# Patient Record
Sex: Male | Born: 1950
Health system: Southern US, Community
[De-identification: ages and names within clinical notes are randomized; demographics above are authoritative.]

## PROBLEM LIST (undated history)

## (undated) DIAGNOSIS — K635 Polyp of colon: Secondary | ICD-10-CM

## (undated) DIAGNOSIS — M1990 Unspecified osteoarthritis, unspecified site: Secondary | ICD-10-CM

## (undated) DIAGNOSIS — C801 Malignant (primary) neoplasm, unspecified: Secondary | ICD-10-CM

## (undated) DIAGNOSIS — I10 Essential (primary) hypertension: Secondary | ICD-10-CM

## (undated) DIAGNOSIS — M2550 Pain in unspecified joint: Secondary | ICD-10-CM

## (undated) DIAGNOSIS — Z87442 Personal history of urinary calculi: Secondary | ICD-10-CM

## (undated) HISTORY — DX: Essential (primary) hypertension: I10

---

## 2008-10-07 ENCOUNTER — Ambulatory Visit: Payer: Self-pay | Admitting: Gastroenterology

## 2008-10-07 ENCOUNTER — Ambulatory Visit (HOSPITAL_COMMUNITY): Admission: RE | Admit: 2008-10-07 | Discharge: 2008-10-07 | Payer: Self-pay | Admitting: Gastroenterology

## 2008-10-07 HISTORY — PX: COLONOSCOPY: SHX174

## 2008-10-17 HISTORY — PX: RECTAL SURGERY: SHX760

## 2009-10-14 ENCOUNTER — Emergency Department (HOSPITAL_COMMUNITY): Admission: EM | Admit: 2009-10-14 | Discharge: 2009-10-14 | Payer: Self-pay | Admitting: Emergency Medicine

## 2009-10-15 ENCOUNTER — Inpatient Hospital Stay (HOSPITAL_COMMUNITY): Admission: AD | Admit: 2009-10-15 | Discharge: 2009-10-21 | Payer: Self-pay | Admitting: General Surgery

## 2009-10-15 IMAGING — CT CT ABDOMEN W/ CM
2 of 5 series · 15 of 46 positions shown, 17 images · IV contrast (Omnipaque 300)
Comparison: None

CT ABDOMEN

CLINICAL DATA: Rectal mass.

CT ABDOMEN AND PELVIS WITH CONTRAST
TECHNIQUE: Multidetector CT imaging of the abdomen and pelvis was
performed using the standard protocol following bolus
administration of intravenous contrast.
Contrast: 100 ml intravenous [3W]

[Series 2: abd_pel_with 5.0 b40f · axial · 0.74mm/px · z∈[-478,-28]mm · 12 of 102 slices shown, 14 images]
[im 6/102  soft-tissue]
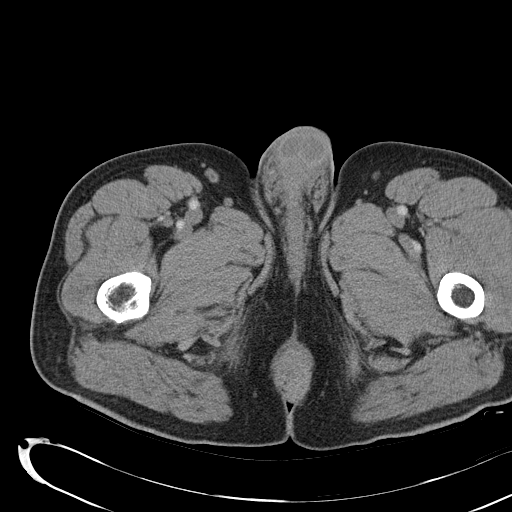
[im 6/102  bone]
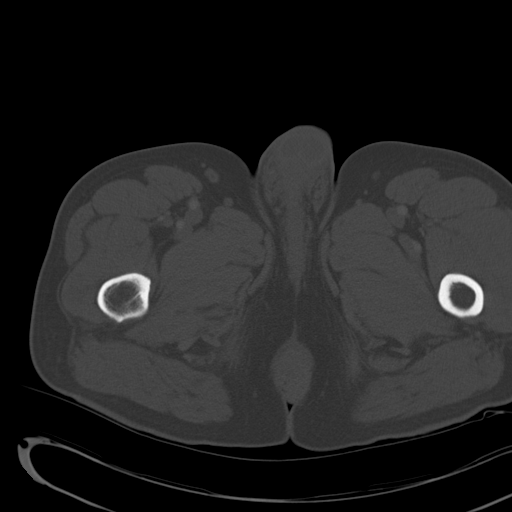
[im 17/102  soft-tissue]
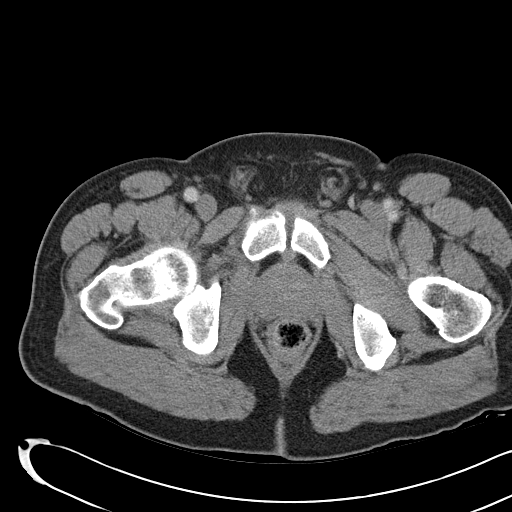
[im 23/102  soft-tissue]
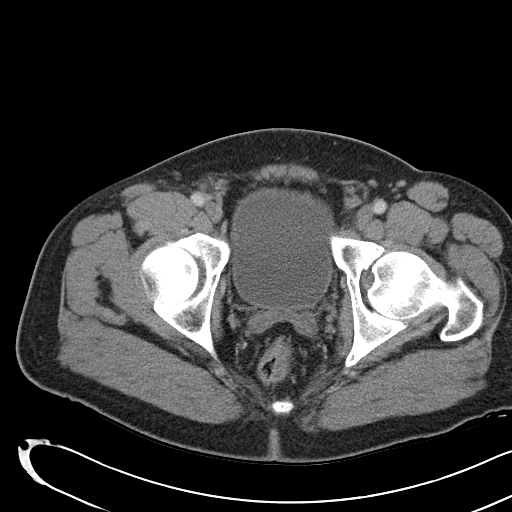
[im 29/102  soft-tissue]
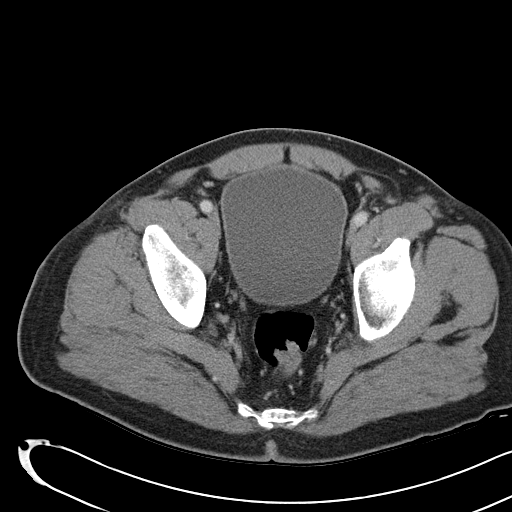
[im 40/102  soft-tissue]
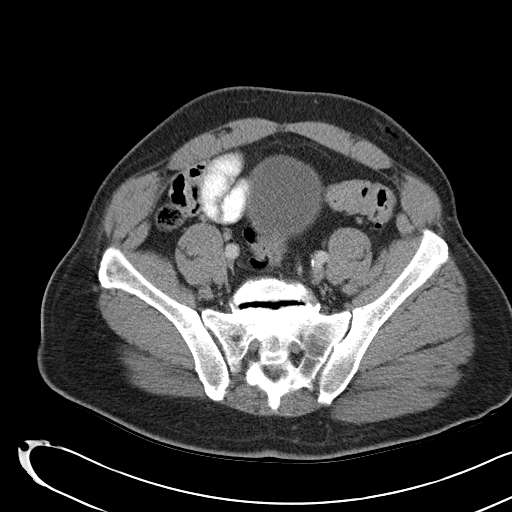
[im 45/102  soft-tissue]
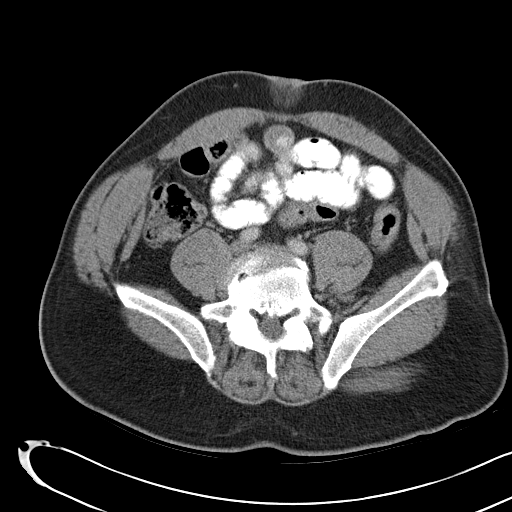
[im 57/102  soft-tissue]
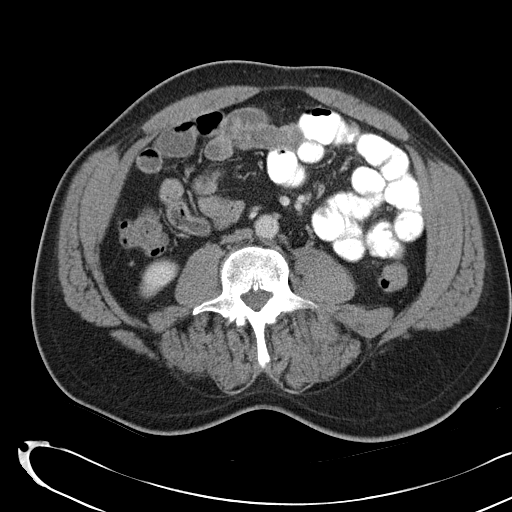
[im 62/102  soft-tissue]
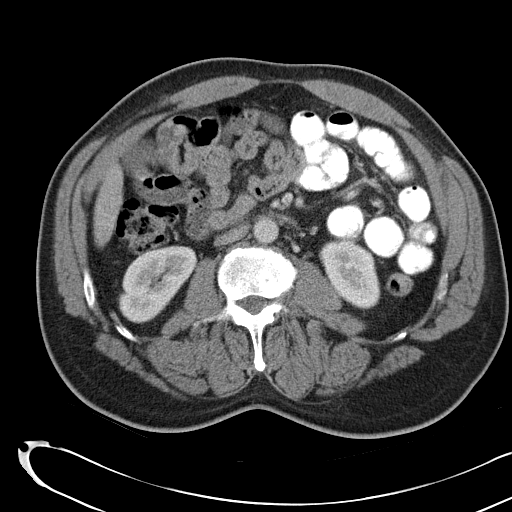
[im 73/102  soft-tissue]
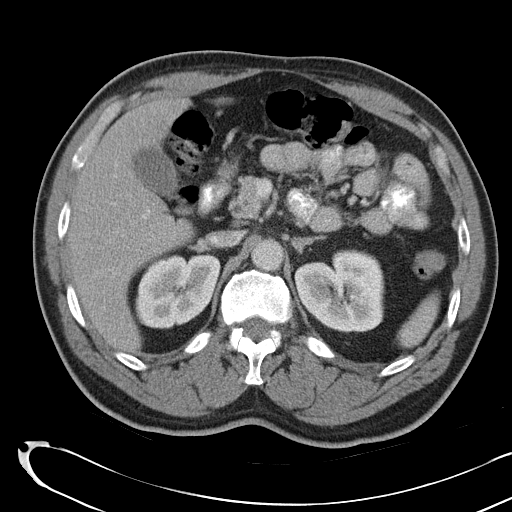
[im 73/102  bone]
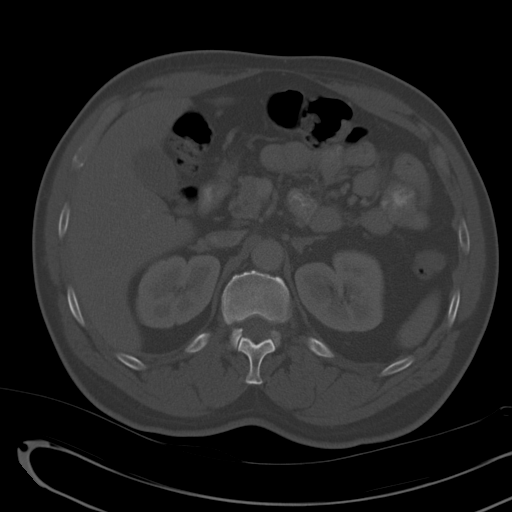
[im 79/102  soft-tissue]
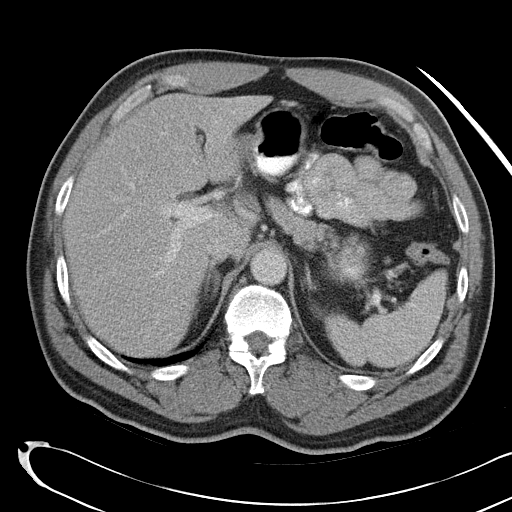
[im 85/102  soft-tissue]
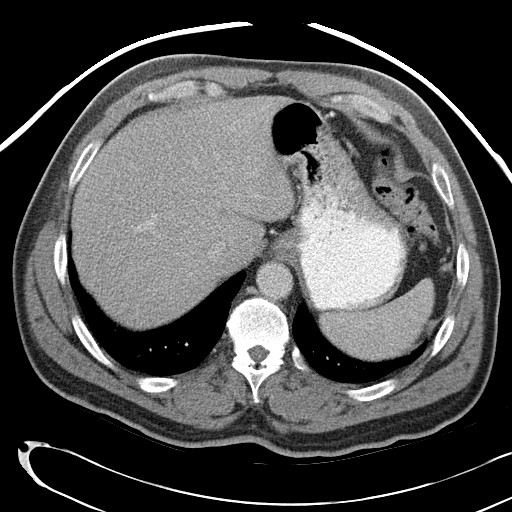
[im 96/102  soft-tissue]
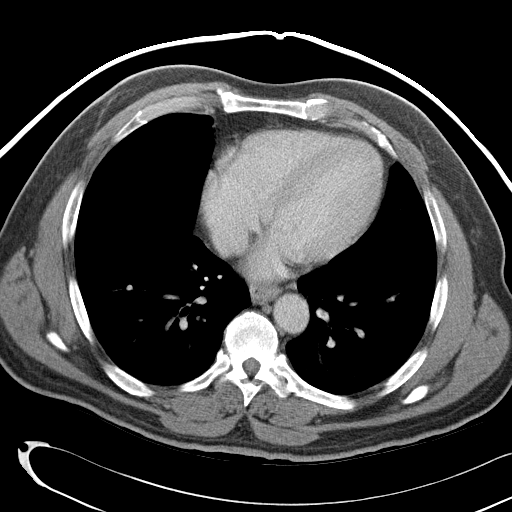

[Series 4: mpr cor post contrast (id) · coronal · 0.72mm/px · 3 of 96 slices shown]
[im 32/96  soft-tissue]
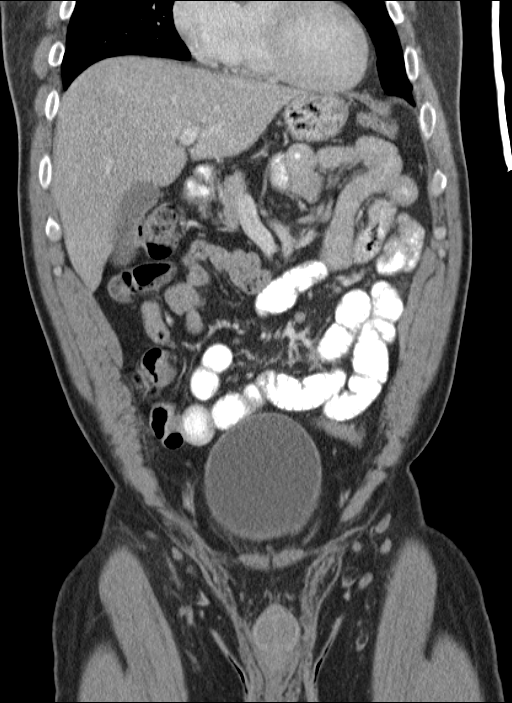
[im 43/96  soft-tissue]
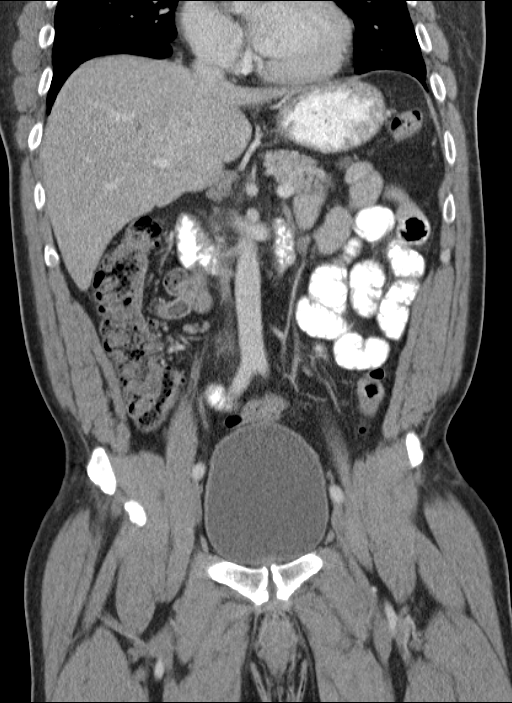
[im 53/96  soft-tissue]
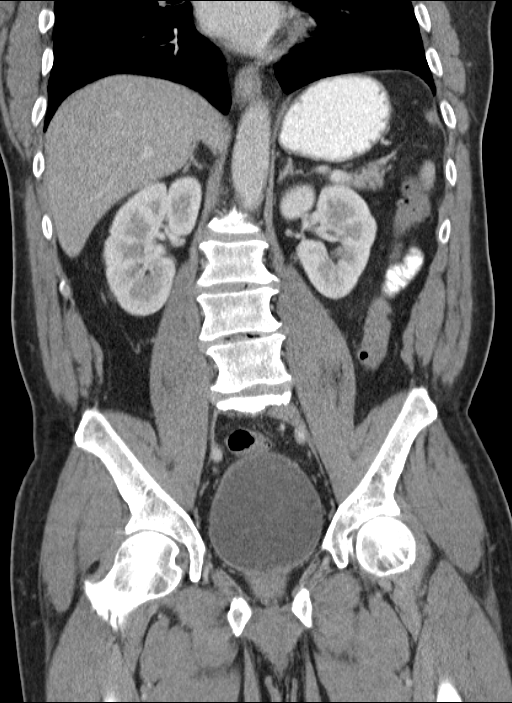

[15 of 46 positions shown; findings below may reference images not displayed]

FINDINGS: A 3 mm nodule in the 4 mm nodule (images six and one
respectively) are identified within the right middle lobe.

Several small hepatic cysts are identified, but the liver is
otherwise unremarkable.
The spleen, pancreas, gallbladder and kidneys, and of the left
adrenal gland are unremarkable.
An 8 mm right adrenal nodule (image 30) has a washout of 45% and
characteristic of an adenoma.

A 6 x 11 mm left periaortic lymph node (image 32) is nonspecific.
No other enlarged lymph nodes are identified.
The visualized bowel is unremarkable.
There is no evidence of free fluid, biliary dilatation, or
abdominal aortic aneurysm.
A moderate wide mouth umbilical hernia containing fat is noted.

No acute or suspicious bony abnormalities are identified.
IMPRESSION: Two small right middle lobe nodules - indeterminate.  Metastatic
disease difficult to exclude given this patient's history of rectal
cancer.  Consider further evaluation with full chest CT.

Upper limits of normal left periaortic lymph node - most likely
reactive but attention to this area on future scans recommended.
PET-CT may be helpful for further evaluation.

Moderate umbilical hernia containing only fat.

CT PELVIS
FINDINGS: Diffuse rectal wall thickening is identified likely
represents this patient's known rectal carcinoma.  Minimal
perirectal stranding is noted.
No enlarged perirectal lymph nodes are identified, although shotty
pelvic sidewall lymph nodes bilaterally are noted.
No evidence of free fluid or urinary calculi identified.
The remainder of the visualized bowel is unremarkable.

The bladder is within normal limits.
No acute or suspicious bony abnormalities are identified.
IMPRESSION: Rectal wall thickening as described likely representing this
patient's known rectal carcinoma. Minimal perirectal stranding may
represent adjacent spread. No enlarged perirectal lymph nodes.

## 2009-10-15 IMAGING — CR DG CHEST 1V PORT
1 series · 1 of 1 positions shown · non-contrast
Comparison: None

CLINICAL DATA: Rectal mass.  Preop respiratory exam.

PORTABLE CHEST - 1 VIEW

[view not recorded]
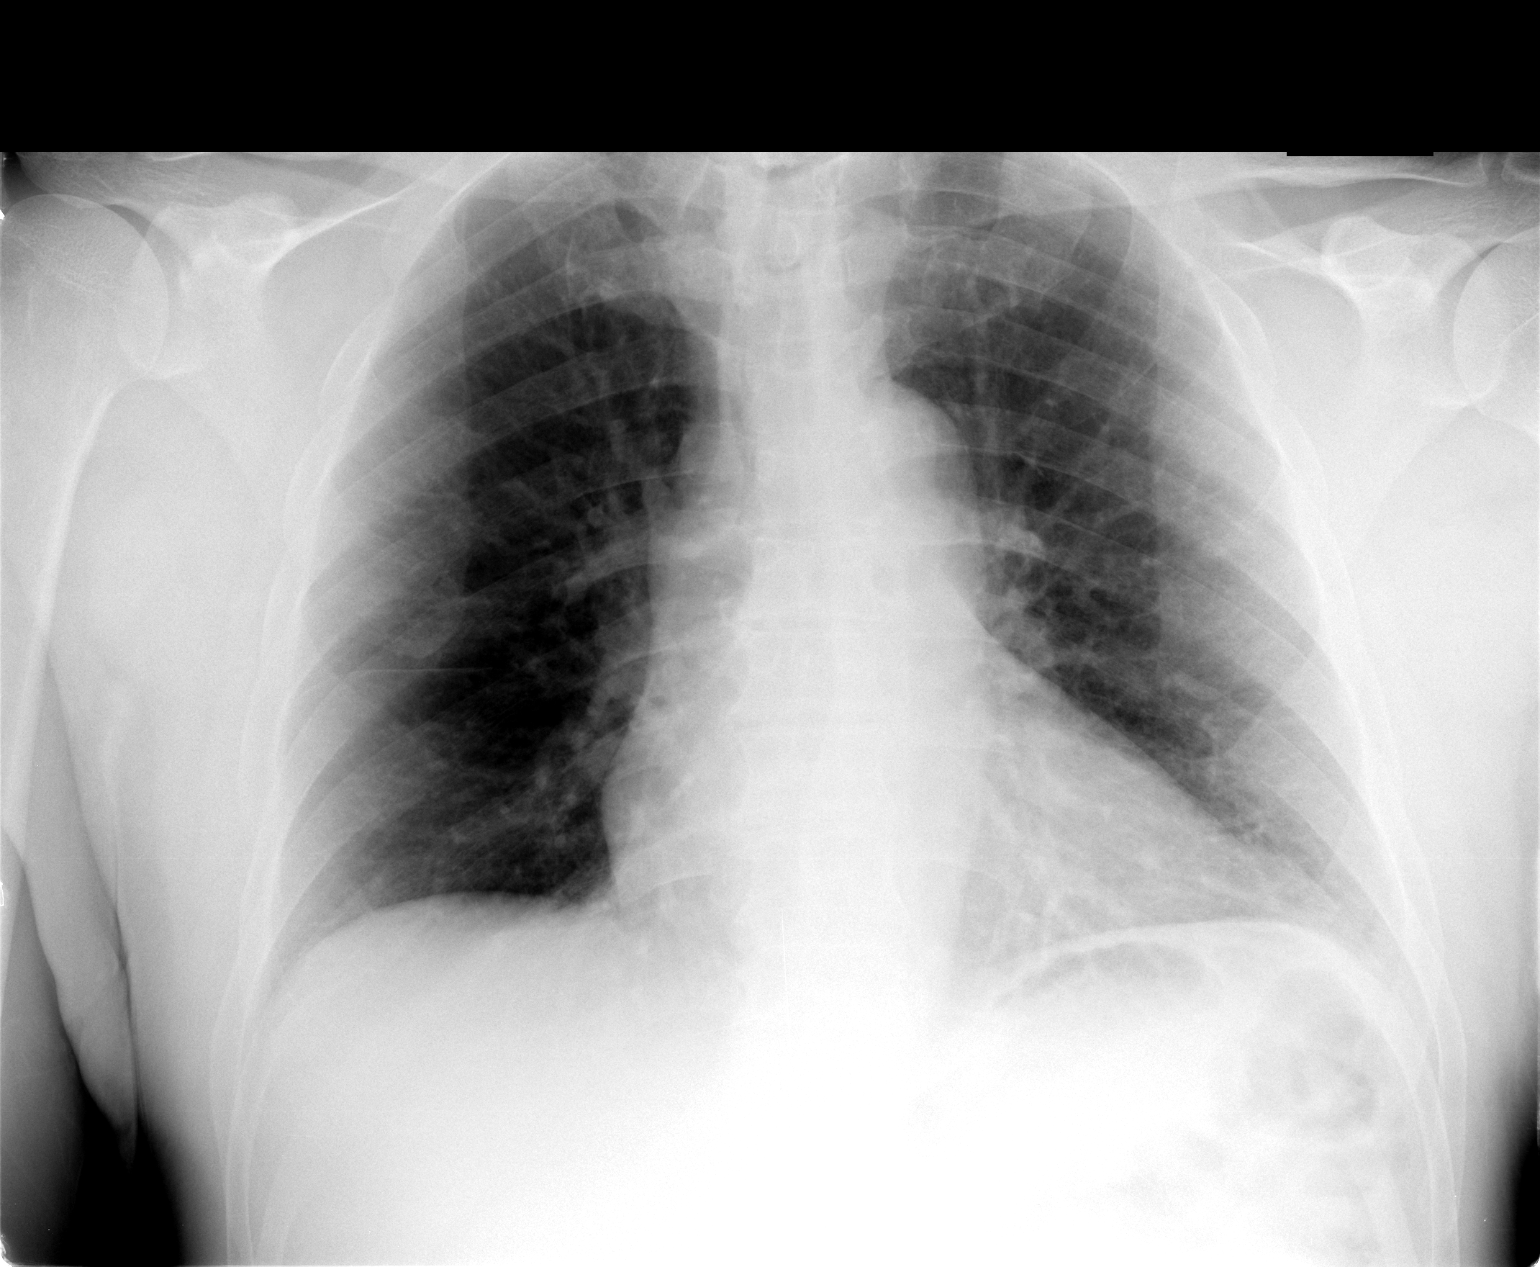

[1 of 1 positions shown; findings below may reference images not displayed]

FINDINGS: The cardiac silhouette, mediastinal and hilar contours
are within normal limits.  The lungs are clear.  The bony thorax is
intact
IMPRESSION: No acute cardiopulmonary findings.

## 2009-10-16 ENCOUNTER — Encounter (INDEPENDENT_AMBULATORY_CARE_PROVIDER_SITE_OTHER): Payer: Self-pay | Admitting: General Surgery

## 2009-10-20 ENCOUNTER — Encounter (INDEPENDENT_AMBULATORY_CARE_PROVIDER_SITE_OTHER): Payer: Self-pay | Admitting: General Surgery

## 2011-01-02 LAB — DIFFERENTIAL
Basophils Absolute: 0 10*3/uL (ref 0.0–0.1)
Eosinophils Relative: 5 % (ref 0–5)
Lymphocytes Relative: 27 % (ref 12–46)
Lymphs Abs: 1.8 10*3/uL (ref 0.7–4.0)
Neutro Abs: 4.1 10*3/uL (ref 1.7–7.7)
Neutrophils Relative %: 60 % (ref 43–77)

## 2011-01-02 LAB — CBC
Platelets: 254 10*3/uL (ref 150–400)
RDW: 12.6 % (ref 11.5–15.5)
WBC: 6.9 10*3/uL (ref 4.0–10.5)

## 2011-01-02 LAB — BASIC METABOLIC PANEL
BUN: 13 mg/dL (ref 6–23)
Calcium: 9.6 mg/dL (ref 8.4–10.5)
Creatinine, Ser: 0.85 mg/dL (ref 0.4–1.5)
GFR calc non Af Amer: >60 mL/min (ref >60)

## 2011-01-17 LAB — DIFFERENTIAL
Basophils Absolute: 0 10*3/uL (ref 0.0–0.1)
Basophils Relative: 0 % (ref 0–1)
Eosinophils Absolute: 0.2 10*3/uL (ref 0.0–0.7)
Eosinophils Absolute: 0.3 10*3/uL (ref 0.0–0.7)
Eosinophils Relative: 2 % (ref 0–5)
Lymphs Abs: 2.6 10*3/uL (ref 0.7–4.0)
Monocytes Absolute: 0.4 10*3/uL (ref 0.1–1.0)
Monocytes Absolute: 0.6 10*3/uL (ref 0.1–1.0)
Monocytes Relative: 7 % (ref 3–12)
Neutro Abs: 4.4 10*3/uL (ref 1.7–7.7)
Neutrophils Relative %: 56 % (ref 43–77)

## 2011-01-17 LAB — BASIC METABOLIC PANEL
BUN: 6 mg/dL (ref 6–23)
CO2: 27 mEq/L (ref 19–32)
Calcium: 9.3 mg/dL (ref 8.4–10.5)
Chloride: 103 mEq/L (ref 96–112)
Glucose, Bld: 97 mg/dL (ref 70–99)
Potassium: 3.4 mEq/L — ABNORMAL LOW (ref 3.5–5.1)
Potassium: 3.9 mEq/L (ref 3.5–5.1)
Sodium: 138 mEq/L (ref 135–145)

## 2011-01-17 LAB — CBC
HCT: 41.7 % (ref 39.0–52.0)
Hemoglobin: 14.1 g/dL (ref 13.0–17.0)
Hemoglobin: 14.9 g/dL (ref 13.0–17.0)
MCHC: 34 g/dL (ref 30.0–36.0)
MCV: 87.8 fL (ref 78.0–100.0)
RBC: 5.09 MIL/uL (ref 4.22–5.81)
RDW: 12.6 % (ref 11.5–15.5)
WBC: 7.9 10*3/uL (ref 4.0–10.5)

## 2011-01-25 ENCOUNTER — Ambulatory Visit (HOSPITAL_COMMUNITY)
Admission: RE | Admit: 2011-01-25 | Discharge: 2011-01-25 | Disposition: A | Discharge status: Home / Self Care | Payer: Self-pay | Source: Ambulatory Visit | Attending: Occupational Medicine | Admitting: Occupational Medicine

## 2011-01-25 ENCOUNTER — Other Ambulatory Visit: Payer: Self-pay | Admitting: Occupational Medicine

## 2011-01-25 DIAGNOSIS — Z0289 Encounter for other administrative examinations: Secondary | ICD-10-CM | POA: Insufficient documentation

## 2011-01-25 DIAGNOSIS — Z021 Encounter for pre-employment examination: Secondary | ICD-10-CM

## 2011-01-25 DIAGNOSIS — F17200 Nicotine dependence, unspecified, uncomplicated: Secondary | ICD-10-CM | POA: Insufficient documentation

## 2011-01-25 DIAGNOSIS — I10 Essential (primary) hypertension: Secondary | ICD-10-CM | POA: Insufficient documentation

## 2011-01-25 IMAGING — CR DG CHEST 2V
2 series · 2 of 2 positions shown · non-contrast
Comparison: [DATE]

CLINICAL DATA: Pre employment physical exam, smoker, hypertension

CHEST - 2 VIEW

[view not recorded (1 of 2)]
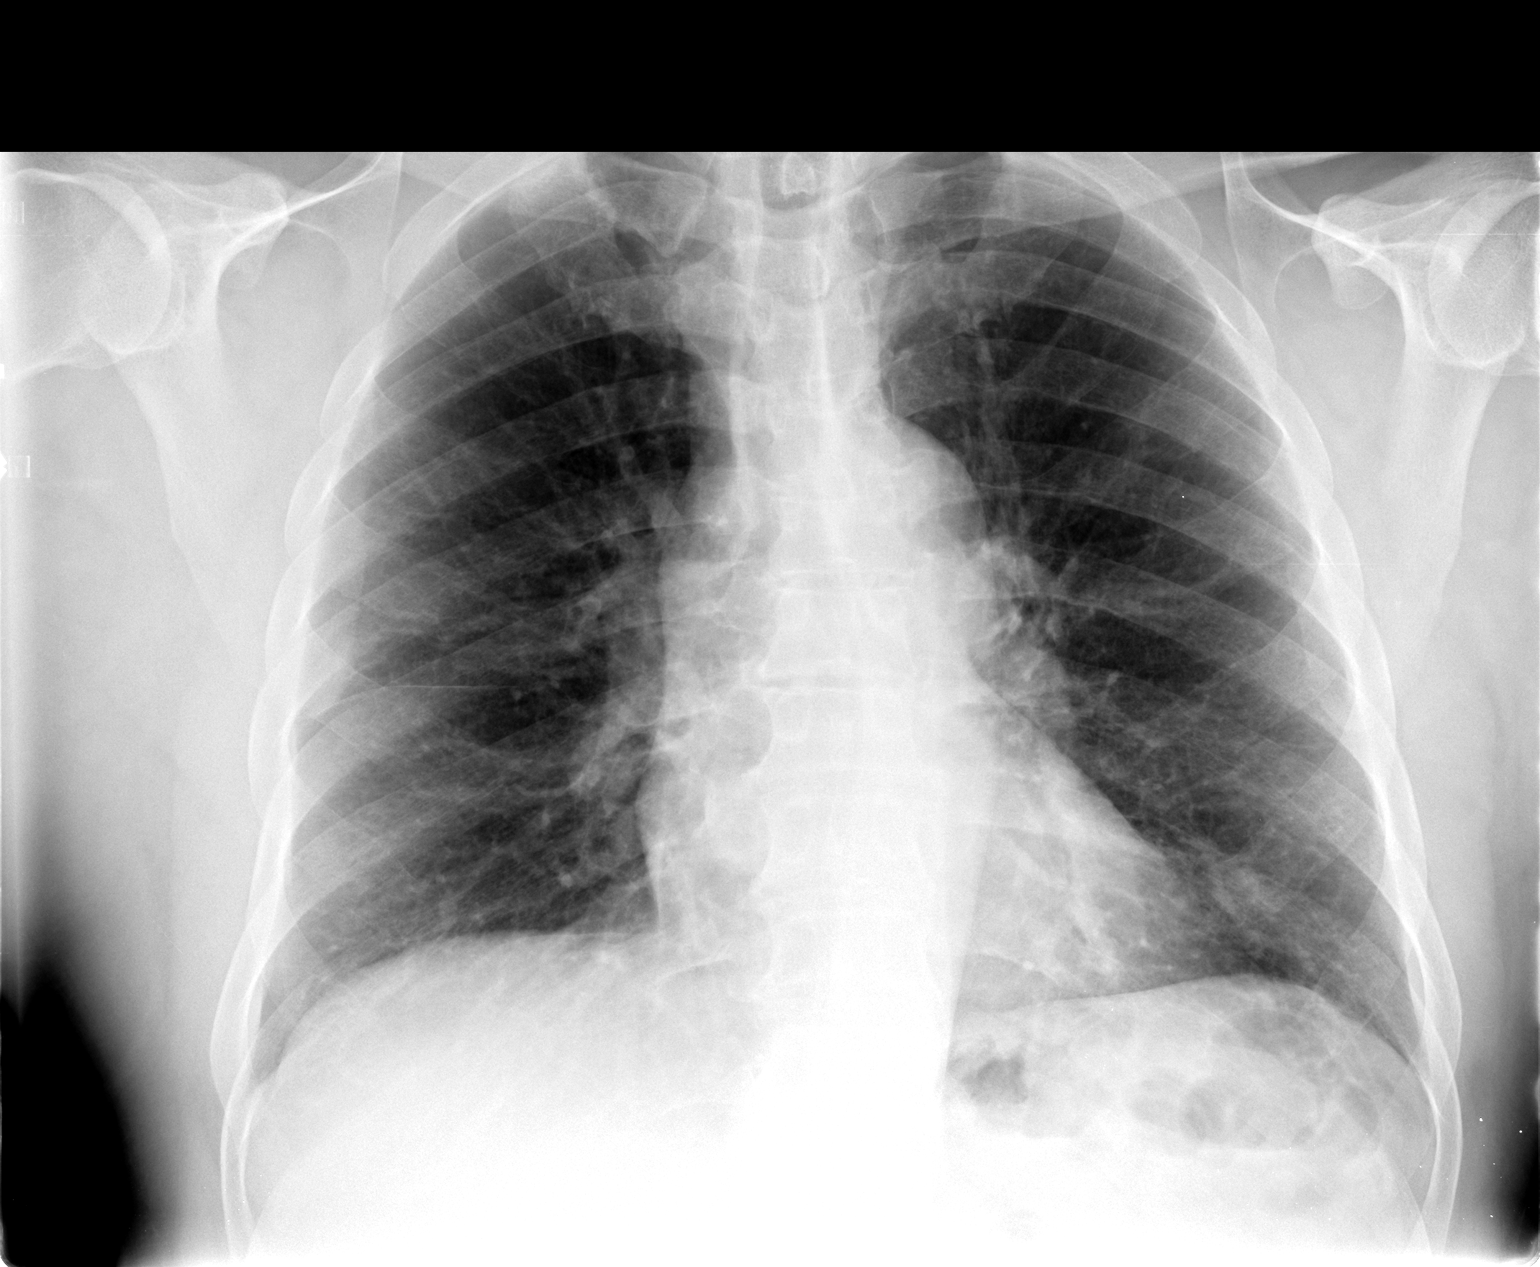

[view not recorded (2 of 2)]
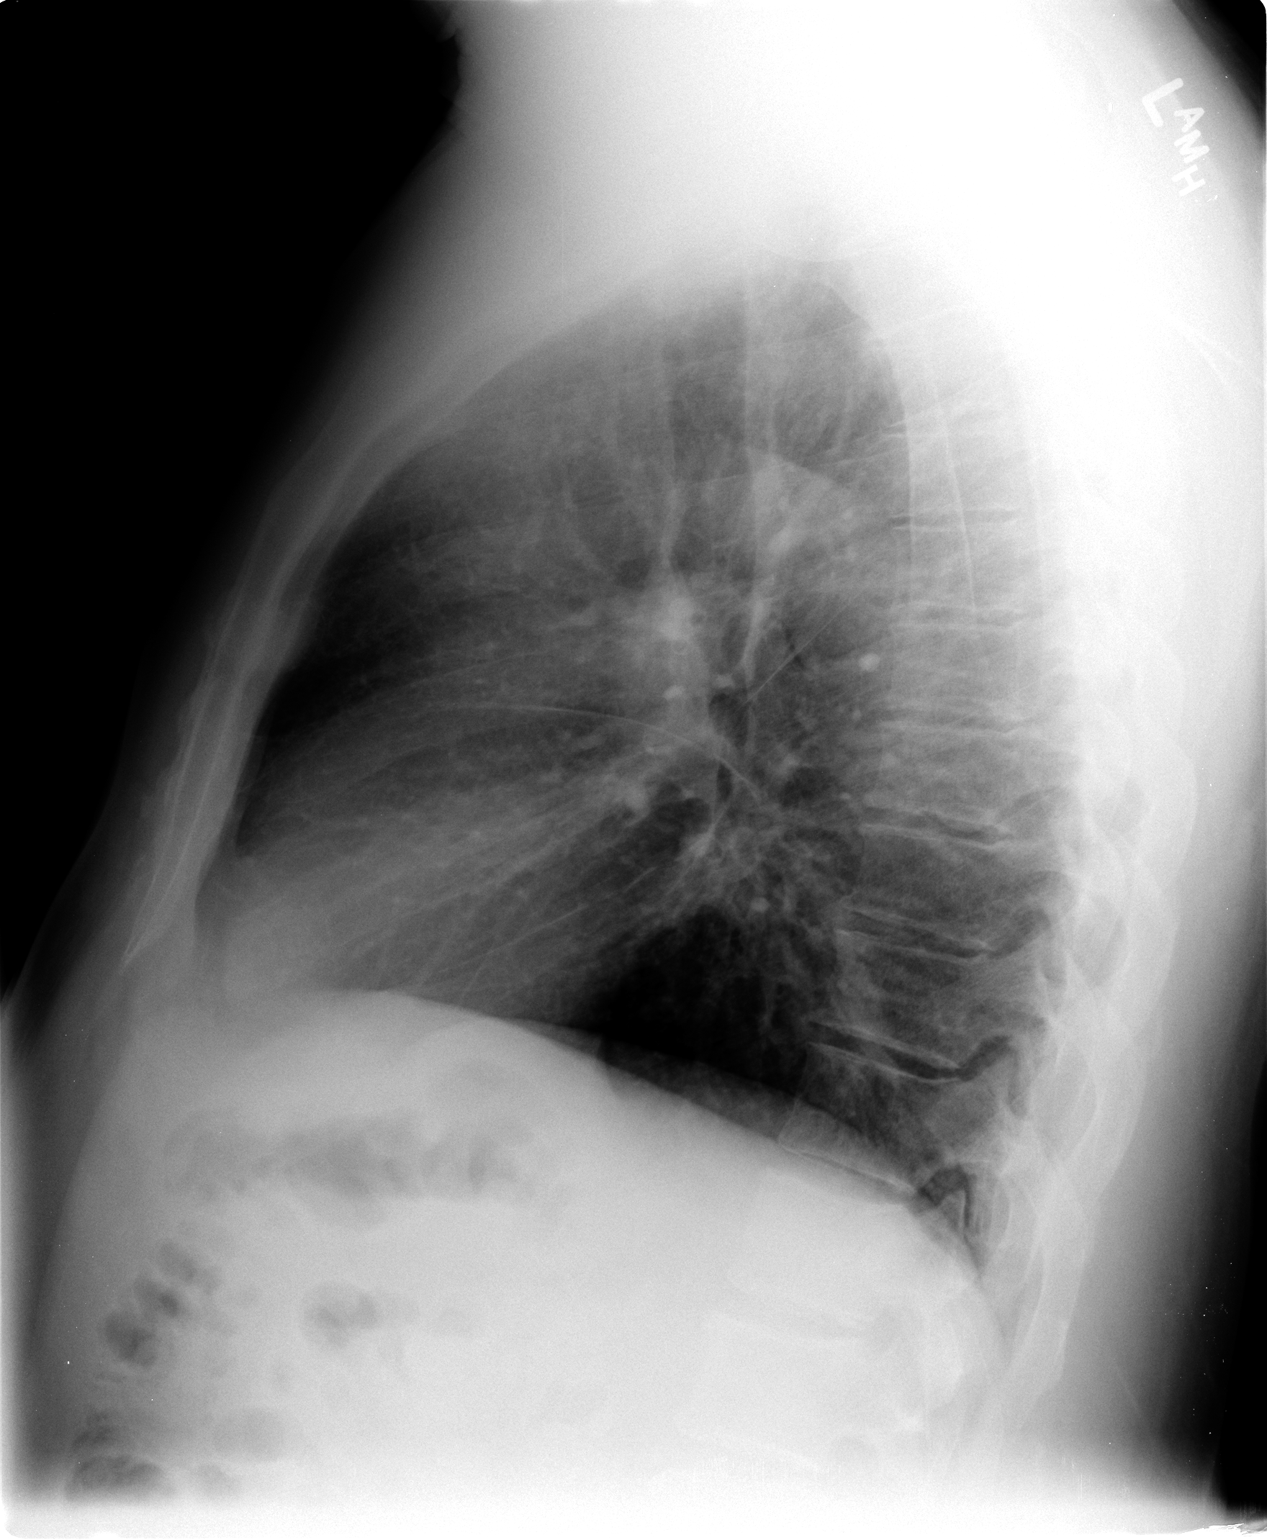

[2 of 2 positions shown; findings below may reference images not displayed]

FINDINGS: The cardiac silhouette, mediastinum, pulmonary
vasculature are within normal limits.  Both lungs are clear.  Mild
chronic interstitial fibrotic disease is noted. There is no acute
bony abnormality.
IMPRESSION: There is no evidence of acute cardiac or pulmonary process.

## 2011-03-01 NOTE — Op Note (Signed)
NAME:  KINTE, TRIM NO.:  [id number]   MEDICAL RECORD NO.:  [id number]          PATIENT TYPE:  AMB   LOCATION:  DAY                           FACILITY:  [facility]   PHYSICIAN:  Kassie Mends, M.D.      DATE OF BIRTH:  08/27/1951   DATE OF PROCEDURE:  10/07/2008  DATE OF DISCHARGE:                               OPERATIVE REPORT   REFERRING Heli Dino:  Tesfaye D. Fanta, MD   PROCEDURE:  Colonoscopy with snare cautery and cold forceps polypectomy.   INDICATION FOR EXAM:  Dustin [name] is a 60 year old male, who presents for  average risk colon cancer screening.   FINDINGS:  1. Multiple large fold seen in the right colon.  Five polyps were      removed in the cecum in the ascending colon using cold forceps and      snare cautery polypectomy.  The polyps varied in size from 3 mm to      8 mm.  2. Slightly pedunculated 6-mm polyps seen in a transverse colon and      removed via snare cautery.  A 3-mm sessile sigmoid colon polyp      removed via cold forceps.  A 6-mm sessile rectal polyp removed via      snare cautery.  3. Rare diverticula seen throughout the ascending and sigmoid colon.      Otherwise, no masses, inflammatory changes, or AVMs seen.  4. Small internal hemorrhoids.  Otherwise, normal retroflex view of      the rectum.   DIAGNOSIS:  Multiple colorectal polyps.   RECOMMENDATIONS:  1. He should follow a high-fiber diet.  He was given a handout on high-      fiber diet, polyps, diverticulosis, and hemorrhoids.  2. Screening colonoscopy in 5 years due to the number of polyps seen.      If his polyps are adenomatous, then we would recommend his first-      degree relatives have a screening colonoscopy at age 33 and then      every 5 years.  3. Will call Mr. Patty with results of his biopsies.  4. No aspirin, NSAIDs, or anticoagulation for 5 days.   PROCEDURE TECHNIQUE:  Physical exam was performed.  Informed consent was  obtained from the patient after  explaining the benefits, risks, and  alternatives of the procedure.  The patient was connected to the monitor  and placed in left lateral position.  Continuous oxygen was provided by  nasal cannula.  IV medicine administered through an indwelling cannula.  After administration of sedation and rectal exam, the patient's rectum  was intubated and the scope was advanced under direct visualization to  the cecum.  Scope was removed slowly by carefully  examining the color, texture, anatomy, and integrity of the mucosa on  the way out.  The patient has slightly tortuous colon.  The patient was  recovered in Endoscopy and discharged home in satisfactory condition.   PATH:  Multiple simple adenomas. TCS in 5 years.      Kassie Mends, M.D.  Electronically Signed  SM/MEDQ  D:  10/07/2008  T:  10/07/2008  Job:  [id number]   cc:   Tesfaye D. Felecia Shelling, MD  Fax: [phone]

## 2013-09-17 ENCOUNTER — Encounter: Payer: Self-pay | Admitting: Gastroenterology

## 2013-11-12 ENCOUNTER — Encounter: Payer: Self-pay | Admitting: Gastroenterology

## 2013-11-12 ENCOUNTER — Encounter (INDEPENDENT_AMBULATORY_CARE_PROVIDER_SITE_OTHER): Payer: Self-pay

## 2013-11-12 ENCOUNTER — Ambulatory Visit (INDEPENDENT_AMBULATORY_CARE_PROVIDER_SITE_OTHER): Payer: BC Managed Care – PPO | Admitting: Gastroenterology

## 2013-11-12 VITALS — BP 136/82 | HR 81 | Temp 98.0°F | Ht 69.0 in | Wt 216.6 lb

## 2013-11-12 DIAGNOSIS — Z860100 Personal history of colon polyps, unspecified: Secondary | ICD-10-CM | POA: Insufficient documentation

## 2013-11-12 MED ORDER — PEG 3350-KCL-NA BICARB-NACL 420 G PO SOLR: 4000.0000 mL | ORAL | Status: DC

## 2013-11-12 NOTE — Progress Notes (Signed)
cc'd to pcp 

## 2013-11-12 NOTE — Addendum Note (Signed)
Addended by: Idamae Schuller on: 11/12/2013 09:49 AM   Modules accepted: Orders

## 2013-11-12 NOTE — Progress Notes (Signed)
Primary Care Physician:  Rosita Fire, MD  Primary Gastroenterologist:  Barney Drain, MD   Chief Complaint  Patient presents with  . Colonoscopy    HPI:  Dustin [name] is a 63 y.o. male to schedule surveillance colonoscopy. He had numerous polyps removed at time of his last colonoscopy 5 years ago. Clinically he is doing well. BM 2-3 per day. No melena, brbpr. No abdominal pain, heartburn, dysphagia, weight loss. It is noted that he underwent rectal surgery in 2010 after her developing prolapsed hemorrhoid tissue.   Current Outpatient Prescriptions  Medication Sig Dispense Refill  . losartan-hydrochlorothiazide (HYZAAR) 50-12.5 MG per tablet Take 1 tablet by mouth daily.       No current facility-administered medications for this visit.    Allergies as of 11/12/2013  . (No Known Allergies)    Past Medical History  Diagnosis Date  . HTN (hypertension)     Past Surgical History  Procedure Laterality Date  . Colonoscopy  10/07/2008    GEX:[id number] colorectal polyps/Small internal hemorrhoids. multiple simple adenomas.  . Rectal surgery  2010    Ziegler: necrotic hemorrhoidal tissue    Family History  Problem Relation Age of Onset  . Colon cancer Neg Hx     History   Social History  . Marital Status: Married    Spouse Name: N/A    Number of Children: 0  . Years of Education: N/A   Occupational History  . Not on file.   Social History Main Topics  . Smoking status: Current Some Day Smoker -- 0.50 packs/day    Types: Cigarettes  . Smokeless tobacco: Not on file  . Alcohol Use: No  . Drug Use: No  . Sexual Activity: Not on file   Other Topics Concern  . Not on file   Social History Narrative  . No narrative on file      ROS:  General: Negative for anorexia, weight loss, fever, chills, fatigue, weakness. Eyes: Negative for vision changes.  ENT: Negative for hoarseness, difficulty swallowing , nasal congestion. CV: Negative for chest pain, angina,  palpitations, dyspnea on exertion, peripheral edema.  Respiratory: Negative for dyspnea at rest, dyspnea on exertion, cough, sputum, wheezing.  GI: See history of present illness. GU:  Negative for dysuria, hematuria, urinary incontinence, urinary frequency, nocturnal urination.  MS: Negative for joint pain, low back pain.  Derm: Negative for rash or itching.  Neuro: Negative for weakness, abnormal sensation, seizure, frequent headaches, memory loss, confusion.  Psych: Negative for anxiety, depression, suicidal ideation, hallucinations.  Endo: Negative for unusual weight change.  Heme: Negative for bruising or bleeding. Allergy: Negative for rash or hives.    Physical Examination:  BP 136/82  Pulse 81  Temp(Src) 98 F (36.7 C) (Oral)  Ht 5\' 9"  (1.753 m)  Wt 216 lb 9.6 oz (98.249 kg)  BMI 31.97 kg/m2   General: Well-nourished, well-developed in no acute distress.  Head: Normocephalic, atraumatic.   Eyes: Conjunctiva pink, no icterus. Mouth: Oropharyngeal mucosa moist and pink , no lesions erythema or exudate. Neck: Supple without thyromegaly, masses, or lymphadenopathy.  Lungs: Clear to auscultation bilaterally.  Heart: Regular rate and rhythm, no murmurs rubs or gallops.  Abdomen: Bowel sounds are normal, nontender, nondistended, no hepatosplenomegaly or masses, no abdominal bruits or    hernia , no rebound or guarding.   Rectal: not performed Extremities: No lower extremity edema. No clubbing or deformities.  Neuro: Alert and oriented x 4 , grossly normal neurologically.  Skin: Warm and dry, no  rash or jaundice.   Psych: Alert and cooperative, normal mood and affect.

## 2013-11-12 NOTE — Addendum Note (Signed)
Addended by: Idamae Schuller on: 11/12/2013 09:53 AM   Modules accepted: Orders

## 2013-11-12 NOTE — Patient Instructions (Signed)
Colonoscopy with Dr. Oneida Alar as scheduled. Please see separate instructions.

## 2013-11-12 NOTE — Assessment & Plan Note (Signed)
Due for surveillance colonoscopy at this time.  I have discussed the risks, alternatives, benefits with regards to but not limited to the risk of reaction to medication, bleeding, infection, perforation and the patient is agreeable to proceed. Written consent to be obtained.  

## 2013-11-13 ENCOUNTER — Encounter (HOSPITAL_COMMUNITY): Payer: Self-pay | Admitting: Pharmacy Technician

## 2013-11-26 ENCOUNTER — Encounter (HOSPITAL_COMMUNITY)
Admission: RE | Disposition: A | Discharge status: Home / Self Care | Payer: Self-pay | Source: Ambulatory Visit | Attending: Gastroenterology

## 2013-11-26 ENCOUNTER — Ambulatory Visit (HOSPITAL_COMMUNITY)
Admission: RE | Admit: 2013-11-26 | Discharge: 2013-11-26 | Disposition: A | Discharge status: Home / Self Care | Payer: BC Managed Care – PPO | Source: Ambulatory Visit | Attending: Gastroenterology | Admitting: Gastroenterology

## 2013-11-26 ENCOUNTER — Encounter (HOSPITAL_COMMUNITY): Payer: Self-pay | Admitting: *Deleted

## 2013-11-26 DIAGNOSIS — Z79899 Other long term (current) drug therapy: Secondary | ICD-10-CM | POA: Insufficient documentation

## 2013-11-26 DIAGNOSIS — D126 Benign neoplasm of colon, unspecified: Secondary | ICD-10-CM

## 2013-11-26 DIAGNOSIS — Z860100 Personal history of colon polyps, unspecified: Secondary | ICD-10-CM | POA: Insufficient documentation

## 2013-11-26 DIAGNOSIS — K648 Other hemorrhoids: Secondary | ICD-10-CM | POA: Insufficient documentation

## 2013-11-26 DIAGNOSIS — I10 Essential (primary) hypertension: Secondary | ICD-10-CM | POA: Insufficient documentation

## 2013-11-26 DIAGNOSIS — K5730 Diverticulosis of large intestine without perforation or abscess without bleeding: Secondary | ICD-10-CM | POA: Insufficient documentation

## 2013-11-26 HISTORY — DX: Polyp of colon: K63.5

## 2013-11-26 HISTORY — PX: COLONOSCOPY: SHX5424

## 2013-11-26 SURGERY — COLONOSCOPY
Anesthesia: Moderate Sedation

## 2013-11-26 MED ORDER — MEPERIDINE HCL 100 MG/ML IJ SOLN
INTRAMUSCULAR | Status: DC | PRN
  Administered 2013-11-26 (×2): 25 mg via INTRAVENOUS
  Administered 2013-11-26: 50 mg via INTRAVENOUS

## 2013-11-26 MED ORDER — STERILE WATER FOR IRRIGATION IR SOLN
Status: DC | PRN
  Administered 2013-11-26: 11:00:00

## 2013-11-26 MED ORDER — SODIUM CHLORIDE 0.9 % IV SOLN
INTRAVENOUS | Status: DC
  Administered 2013-11-26: 10:00:00 via INTRAVENOUS

## 2013-11-26 MED ORDER — MIDAZOLAM HCL 5 MG/5ML IJ SOLN
INTRAMUSCULAR | Status: DC
Start: 2013-11-26 — End: 2013-11-26
  Filled 2013-11-26: qty 10

## 2013-11-26 MED ORDER — MIDAZOLAM HCL 5 MG/5ML IJ SOLN
INTRAMUSCULAR | Status: DC | PRN
  Administered 2013-11-26: 2 mg via INTRAVENOUS
  Administered 2013-11-26: 1 mg via INTRAVENOUS
  Administered 2013-11-26: 2 mg via INTRAVENOUS

## 2013-11-26 MED ORDER — MEPERIDINE HCL 100 MG/ML IJ SOLN
INTRAMUSCULAR | Status: AC
  Filled 2013-11-26: qty 2

## 2013-11-26 NOTE — H&P (Signed)
  Primary Care Physician:  Rosita Fire, MD Primary Gastroenterologist:  Dr. Oneida Alar  Pre-Procedure History & Physical: HPI:  Dustin [name] is a 63 y.o. male here for  PERSONAL HISTORY OF POLYPS.  Past Medical History  Diagnosis Date  . HTN (hypertension)   . Colon polyps     Past Surgical History  Procedure Laterality Date  . Colonoscopy  10/07/2008    WUJ:[id number] colorectal polyps/Small internal hemorrhoids. multiple simple adenomas.  . Rectal surgery  2010    Ziegler: necrotic hemorrhoidal tissue    Prior to Admission medications   Medication Sig Start Date End Date Taking? Authorizing Provider  acetaminophen (TYLENOL) 500 MG tablet Take 500 mg by mouth every 6 (six) hours as needed.   Yes Historical Provider, MD  ibuprofen (ADVIL,MOTRIN) 200 MG tablet Take 400-600 mg by mouth daily as needed for moderate pain.   Yes Historical Provider, MD  losartan-hydrochlorothiazide (HYZAAR) 50-12.5 MG per tablet Take 1 tablet by mouth daily.   Yes Historical Provider, MD  polyethylene glycol-electrolytes (TRILYTE) 420 G solution Take 4,000 mLs by mouth as directed. 11/12/13  Yes Danie Binder, MD    Allergies as of 11/12/2013  . (No Known Allergies)    Family History  Problem Relation Age of Onset  . Colon cancer Neg Hx     History   Social History  . Marital Status: Married    Spouse Name: N/A    Number of Children: 0  . Years of Education: N/A   Occupational History  . Not on file.   Social History Main Topics  . Smoking status: Current Every Day Smoker -- 0.50 packs/day for 30 years    Types: Cigarettes  . Smokeless tobacco: Not on file  . Alcohol Use: No  . Drug Use: No  . Sexual Activity: Not on file   Other Topics Concern  . Not on file   Social History Narrative  . No narrative on file    Review of Systems: See HPI, otherwise negative ROS   Physical Exam: BP 118/74  Pulse 68  Temp(Src) 97.9 F (36.6 C) (Oral)  Resp 20  Ht 5\' 9"  (1.753 m)  Wt  216 lb (97.977 kg)  BMI 31.88 kg/m2  SpO2 98% General:   Alert,  pleasant and cooperative in NAD Head:  Normocephalic and atraumatic. Neck:  Supple; Lungs:  Clear throughout to auscultation.    Heart:  Regular rate and rhythm. Abdomen:  Soft, nontender and nondistended. Normal bowel sounds, without guarding, and without rebound.   Neurologic:  Alert and  oriented x4;  grossly normal neurologically.  Impression/Plan:    PERSONAL HISTORY OF POLYPS.  PLAN:  1. TCS TODAY

## 2013-11-26 NOTE — Discharge Instructions (Signed)
You had 2 small polyps removed. You have internal hemorrhoids and diverticulosis IN YOUR RIGHT AND LEFT COLON.   FOLLOW A HIGH FIBER DIET. AVOID ITEMS THAT CAUSE BLOATING. SEE INFO BELOW.  YOUR BIOPSY RESULTS SHOULD BE BACK IN 7 DAYS.  Next colonoscopy in 5 years. YOUR SISTERS, BROTHERS, CHILDREN, AND PARENTS NEED TO HAVE A COLONOSCOPY STARTING AT THE AGE OF 40.     Colonoscopy Care After Read the instructions outlined below and refer to this sheet in the next week. These discharge instructions provide you with general information on caring for yourself after you leave the hospital. While your treatment has been planned according to the most current medical practices available, unavoidable complications occasionally occur. If you have any problems or questions after discharge, call DR. Zackarey Holleman, [phone].  ACTIVITY  You may resume your regular activity, but move at a slower pace for the next 24 hours.   Take frequent rest periods for the next 24 hours.   Walking will help get rid of the air and reduce the bloated feeling in your belly (abdomen).   No driving for 24 hours (because of the medicine (anesthesia) used during the test).   You may shower.   Do not sign any important legal documents or operate any machinery for 24 hours (because of the anesthesia used during the test).    NUTRITION  Drink plenty of fluids.   You may resume your normal diet as instructed by your doctor.   Begin with a light meal and progress to your normal diet. Heavy or fried foods are harder to digest and may make you feel sick to your stomach (nauseated).   Avoid alcoholic beverages for 24 hours or as instructed.    MEDICATIONS  You may resume your normal medications.   WHAT YOU CAN EXPECT TODAY  Some feelings of bloating in the abdomen.   Passage of more gas than usual.   Spotting of blood in your stool or on the toilet paper  .  IF YOU HAD POLYPS REMOVED DURING THE  COLONOSCOPY:  Eat a soft diet IF YOU HAVE NAUSEA, BLOATING, ABDOMINAL PAIN, OR VOMITING.    FINDING OUT THE RESULTS OF YOUR TEST Not all test results are available during your visit. DR. Oneida Alar WILL CALL YOU WITHIN 7 DAYS OF YOUR PROCEDUE WITH YOUR RESULTS. Do not assume everything is normal if you have not heard from DR. Naudia Crosley IN ONE WEEK, CALL HER OFFICE AT [phone].  SEEK IMMEDIATE MEDICAL ATTENTION AND CALL THE OFFICE: [phone] IF:  You have more than a spotting of blood in your stool.   Your belly is swollen (abdominal distention).   You are nauseated or vomiting.   You have a temperature over 101F.   You have abdominal pain or discomfort that is severe or gets worse throughout the day.  Polyps, Colon  A polyp is extra tissue that grows inside your body. Colon polyps grow in the large intestine. The large intestine, also called the colon, is part of your digestive system. It is a long, hollow tube at the end of your digestive tract where your body makes and stores stool. Most polyps are not dangerous. They are benign. This means they are not cancerous. But over time, some types of polyps can turn into cancer. Polyps that are smaller than a pea are usually not harmful. But larger polyps could someday become or may already be cancerous. To be safe, doctors remove all polyps and test them.   WHO GETS  POLYPS? Anyone can get polyps, but certain people are more likely than others. You may have a greater chance of getting polyps if:  You are over 50.   You have had polyps before.   Someone in your family has had polyps.   Someone in your family has had cancer of the large intestine.   Find out if someone in your family has had polyps. You may also be more likely to get polyps if you:   Eat a lot of fatty foods   Smoke   Drink alcohol   Do not exercise  Eat too much   TREATMENT  The caregiver will remove the polyp during sigmoidoscopy or colonoscopy.   PREVENTION There is not one sure way to prevent polyps. You might be able to lower your risk of getting them if you:  Eat more fruits and vegetables and less fatty food.   Do not smoke.   Avoid alcohol.   Exercise every day.   Lose weight if you are overweight.   Eating more calcium and folate can also lower your risk of getting polyps. Some foods that are rich in calcium are milk, cheese, and broccoli. Some foods that are rich in folate are chickpeas, kidney beans, and spinach.   High-Fiber Diet A high-fiber diet changes your normal diet to include more whole grains, legumes, fruits, and vegetables. Changes in the diet involve replacing refined carbohydrates with unrefined foods. The calorie level of the diet is essentially unchanged. The Dietary Reference Intake (recommended amount) for adult males is 38 grams per day. For adult females, it is 25 grams per day. Pregnant and lactating women should consume 28 grams of fiber per day. Fiber is the intact part of a plant that is not broken down during digestion. Functional fiber is fiber that has been isolated from the plant to provide a beneficial effect in the body. PURPOSE  Increase stool bulk.   Ease and regulate bowel movements.   Lower cholesterol.  INDICATIONS THAT YOU NEED MORE FIBER  Constipation and hemorrhoids.   Uncomplicated diverticulosis (intestine condition) and irritable bowel syndrome.   Weight management.   As a protective measure against hardening of the arteries (atherosclerosis), diabetes, and cancer.   GUIDELINES FOR INCREASING FIBER IN THE DIET  Start adding fiber to the diet slowly. A gradual increase of about 5 more grams (2 slices of whole-wheat bread, 2 servings of most fruits or vegetables, or 1 bowl of high-fiber cereal) per day is best. Too rapid an increase in fiber may result in constipation, flatulence, and bloating.   Drink enough water and fluids to keep your urine clear or pale yellow. Water,  juice, or caffeine-free drinks are recommended. Not drinking enough fluid may cause constipation.   Eat a variety of high-fiber foods rather than one type of fiber.   Try to increase your intake of fiber through using high-fiber foods rather than fiber pills or supplements that contain small amounts of fiber.   The goal is to change the types of food eaten. Do not supplement your present diet with high-fiber foods, but replace foods in your present diet.  INCLUDE A VARIETY OF FIBER SOURCES  Replace refined and processed grains with whole grains, canned fruits with fresh fruits, and incorporate other fiber sources. White rice, white breads, and most bakery goods contain little or no fiber.   Brown whole-grain rice, buckwheat oats, and many fruits and vegetables are all good sources of fiber. These include: broccoli, Brussels sprouts, cabbage,  cauliflower, beets, sweet potatoes, white potatoes (skin on), carrots, tomatoes, eggplant, squash, berries, fresh fruits, and dried fruits.   Cereals appear to be the richest source of fiber. Cereal fiber is found in whole grains and bran. Bran is the fiber-rich outer coat of cereal grain, which is largely removed in refining. In whole-grain cereals, the bran remains. In breakfast cereals, the largest amount of fiber is found in those with "bran" in their names. The fiber content is sometimes indicated on the label.   You may need to include additional fruits and vegetables each day.   In baking, for 1 cup white flour, you may use the following substitutions:   1 cup whole-wheat flour minus 2 tablespoons.   1/2 cup white flour plus 1/2 cup whole-wheat flour.   Diverticulosis Diverticulosis is a common condition that develops when small pouches (diverticula) form in the wall of the colon. The risk of diverticulosis increases with age. It happens more often in people who eat a low-fiber diet. Most individuals with diverticulosis have no symptoms. Those  individuals with symptoms usually experience belly (abdominal) pain, constipation, or loose stools (diarrhea).  HOME CARE INSTRUCTIONS  Increase the amount of fiber in your diet as directed by your caregiver or dietician. This may reduce symptoms of diverticulosis.   Drink at least 6 to 8 glasses of water each day to prevent constipation.   Try not to strain when you have a bowel movement.   Avoiding nuts and seeds to prevent complications is still an uncertain benefit.       FOODS HAVING HIGH FIBER CONTENT INCLUDE:  Fruits. Apple, peach, pear, tangerine, raisins, prunes.   Vegetables. Brussels sprouts, asparagus, broccoli, cabbage, carrot, cauliflower, romaine lettuce, spinach, summer squash, tomato, winter squash, zucchini.   Starchy Vegetables. Baked beans, kidney beans, lima beans, split peas, lentils, potatoes (with skin).   Grains. Whole wheat bread, brown rice, bran flake cereal, plain oatmeal, white rice, shredded wheat, bran muffins.    SEEK IMMEDIATE MEDICAL CARE IF:  You develop increasing pain or severe bloating.   You have an oral temperature above 101F.   You develop vomiting or bowel movements that are bloody or black.   Hemorrhoids Hemorrhoids are dilated (enlarged) veins around the rectum. Sometimes clots will form in the veins. This makes them swollen and painful. These are called thrombosed hemorrhoids. Causes of hemorrhoids include:  Constipation.   Straining to have a bowel movement.   HEAVY LIFTING HOME CARE INSTRUCTIONS  Eat a well balanced diet and drink 6 to 8 glasses of water every day to avoid constipation. You may also use a bulk laxative.   Avoid straining to have bowel movements.   Keep anal area dry and clean.   Do not use a donut shaped pillow or sit on the toilet for long periods. This increases blood pooling and pain.   Move your bowels when your body has the urge; this will require less straining and will decrease pain and  pressure.

## 2013-11-26 NOTE — Op Note (Signed)
[facility] [address] [place], [address]   COLONOSCOPY PROCEDURE REPORT  PATIENT: Dustin [name], Dustin [name]  MR#: [id number] BIRTHDATE: 08/27/1951 , 62  yrs. old GENDER: Male ENDOSCOPIST: Barney Drain, MD REFERRED AC:[id number] Fanta, M.D. PROCEDURE DATE:  11/26/2013 PROCEDURE:   Colonoscopy with cold biopsy polypectomy INDICATIONS:High risk patient with personal history of colonic polypSIMPLE ADENOMAS(9) IN 2009. MEDICATIONS: Demerol 100 mg IV and Versed 8 mg IV  DESCRIPTION OF PROCEDURE:    Physical exam was performed.  Informed consent was obtained from the patient after explaining the benefits, risks, and alternatives to procedure.  The patient was connected to monitor and placed in left lateral position. Continuous oxygen was provided by nasal cannula and IV medicine administered through an indwelling cannula.  After administration of sedation and rectal exam, the patients rectum was intubated and the EC-[id number] ([id number])  colonoscope was advanced under direct visualization to the cecum.  The scope was removed slowly by carefully examining the color, texture, anatomy, and integrity mucosa on the way out.  The patient was recovered in endoscopy and discharged home in satisfactory condition.    COLON FINDINGS: There was moderate diverticulosis noted at the cecum, in the ascending colon, and MILD DIVERTICULOSIS sigmoid colon with associated muscular hypertrophy and tortuosity.  , Two sessile polyps measuring 2-3 mm in size were found in the sigmoid colon.  A polypectomy was performed with cold forceps.  , and Moderate sized internal hemorrhoids were found.  PREP QUALITY: good.CECAL W/D TIME: 18 minutes     COMPLICATIONS: None  ENDOSCOPIC IMPRESSION: 1.   Moderate diverticulosis at the cecum, in the ascending colon, and MILD DIVERTICULOSIS IN sigmoid colon 2.   Two COLON POLYPS REMOVED 3.   Moderate sized internal hemorrhoids  RECOMMENDATIONS: AWAIT  BIOPSY HIGH FIBER DIET TCS IN 5 YEARS WITH AN OVERTUBE. ALL FIRST DEGREE RELATIVES NEEDS A TCS AT AGE 15.       _______________________________ Lorrin MaisBarney Drain, MD 11/26/2013 12:09 PM

## 2013-11-26 NOTE — Progress Notes (Signed)
REVIEWED.  

## 2013-11-28 ENCOUNTER — Encounter (HOSPITAL_COMMUNITY): Payer: Self-pay | Admitting: Gastroenterology

## 2013-11-29 ENCOUNTER — Telehealth: Payer: Self-pay | Admitting: Gastroenterology

## 2013-11-29 NOTE — Telephone Encounter (Signed)
Please call pt. He had A simple adenoma removed from hIS colon. FOLLOW A High fiber diet. TCS IN 5 YEARS. YOUR SISTERS, BROTHERS, CHILDREN, AND PARENTS NEED TO HAVE A COLONOSCOPY STARTING AT THE AGE OF 40.

## 2013-12-02 NOTE — Telephone Encounter (Signed)
cc'd to pcp 

## 2013-12-02 NOTE — Telephone Encounter (Signed)
LMOM to call.

## 2013-12-04 NOTE — Telephone Encounter (Signed)
Pt is aware of results. 

## 2014-06-17 ENCOUNTER — Other Ambulatory Visit: Payer: Self-pay | Admitting: Urology

## 2014-06-17 ENCOUNTER — Ambulatory Visit (INDEPENDENT_AMBULATORY_CARE_PROVIDER_SITE_OTHER): Payer: BC Managed Care – PPO | Admitting: Urology

## 2014-06-17 DIAGNOSIS — N529 Male erectile dysfunction, unspecified: Secondary | ICD-10-CM

## 2014-06-17 DIAGNOSIS — R972 Elevated prostate specific antigen [PSA]: Secondary | ICD-10-CM

## 2014-07-04 ENCOUNTER — Other Ambulatory Visit: Payer: Self-pay | Admitting: Urology

## 2014-07-04 DIAGNOSIS — N529 Male erectile dysfunction, unspecified: Secondary | ICD-10-CM

## 2014-07-22 ENCOUNTER — Ambulatory Visit (HOSPITAL_COMMUNITY)
Admission: RE | Admit: 2014-07-22 | Discharge: 2014-07-22 | Disposition: A | Discharge status: Home / Self Care | Payer: BC Managed Care – PPO | Source: Ambulatory Visit | Attending: Urology | Admitting: Urology

## 2014-07-22 DIAGNOSIS — N528 Other male erectile dysfunction: Secondary | ICD-10-CM | POA: Diagnosis not present

## 2014-07-22 DIAGNOSIS — N529 Male erectile dysfunction, unspecified: Secondary | ICD-10-CM

## 2014-07-22 IMAGING — US US TRANSRECTAL
1 series · 14 of 16 positions shown · non-contrast
Comparison: none

[Series 1: us transrectal · 0.09mm/px · 14 of 20 slices shown]
[im 1/20]
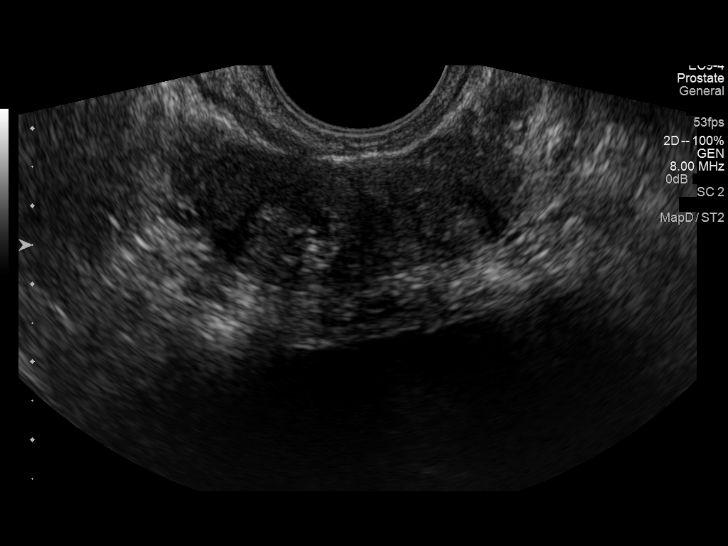
[im 2/20]
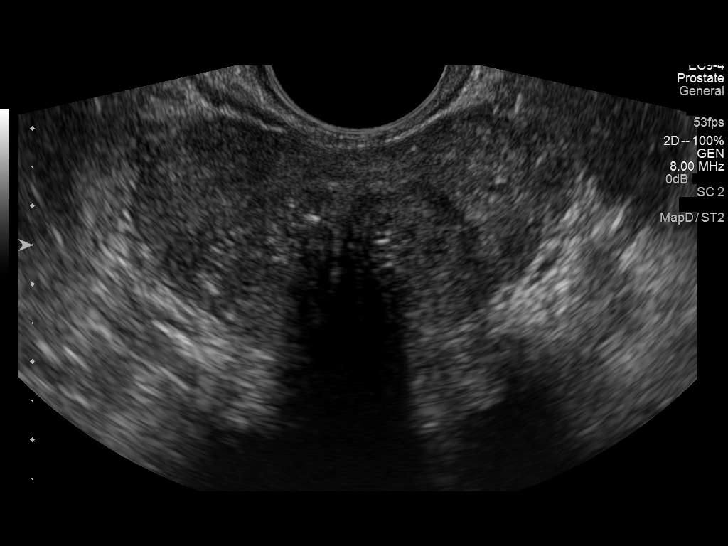
[im 3/20]
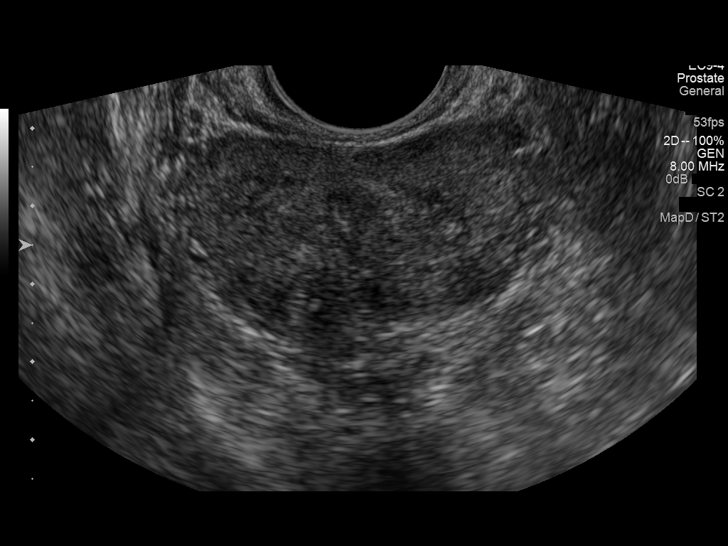
[im 6/20]
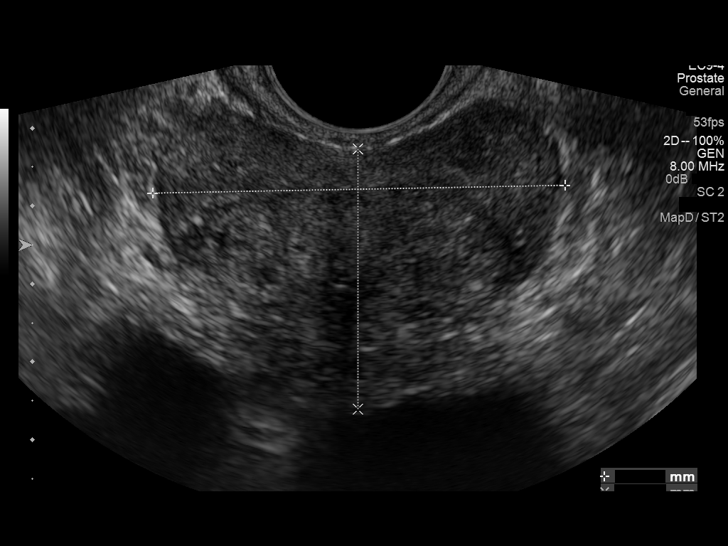
[im 7/20]
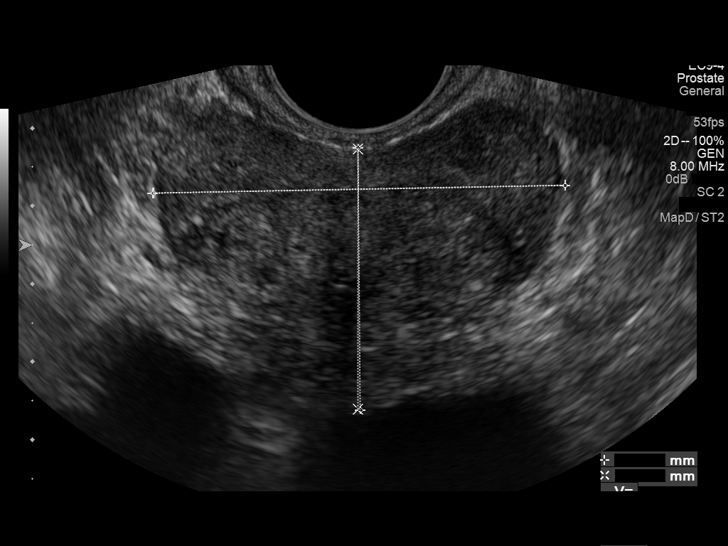
[im 8/20]
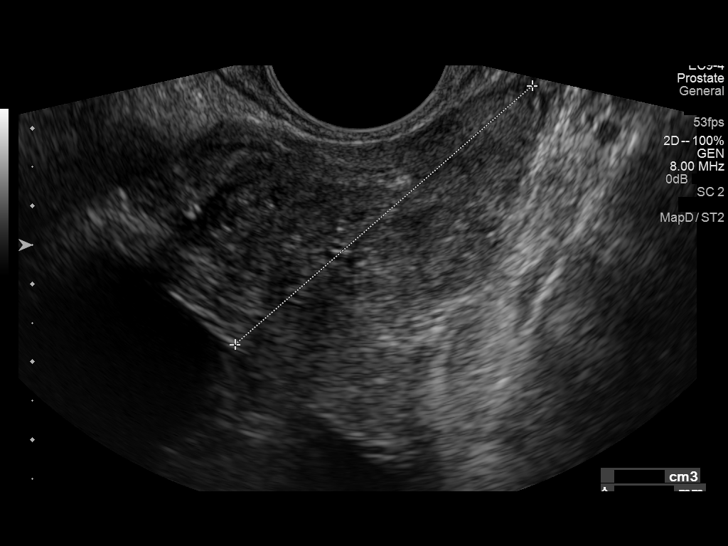
[im 9/20]
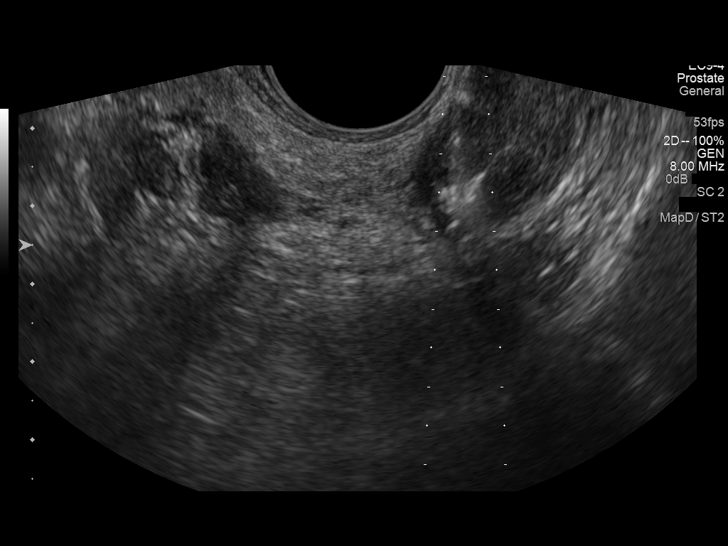
[im 11/20]
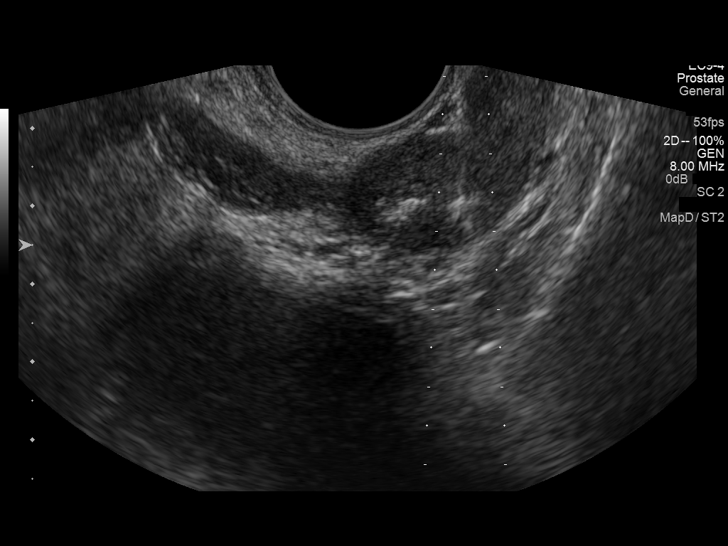
[im 12/20]
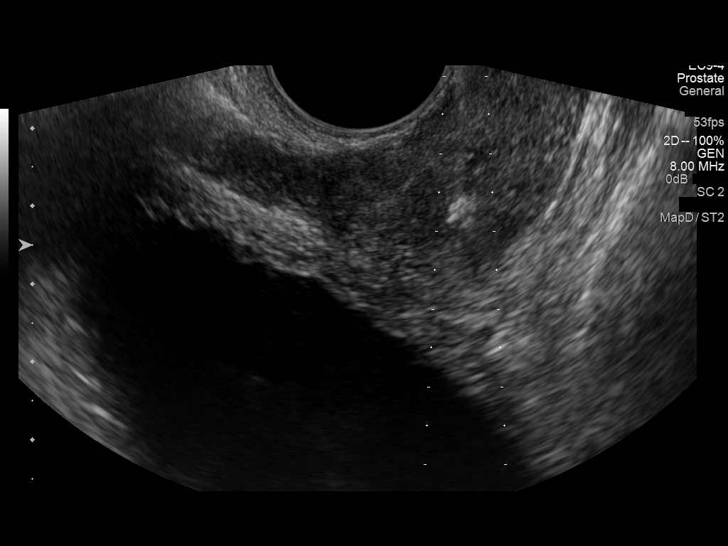
[im 13/20]
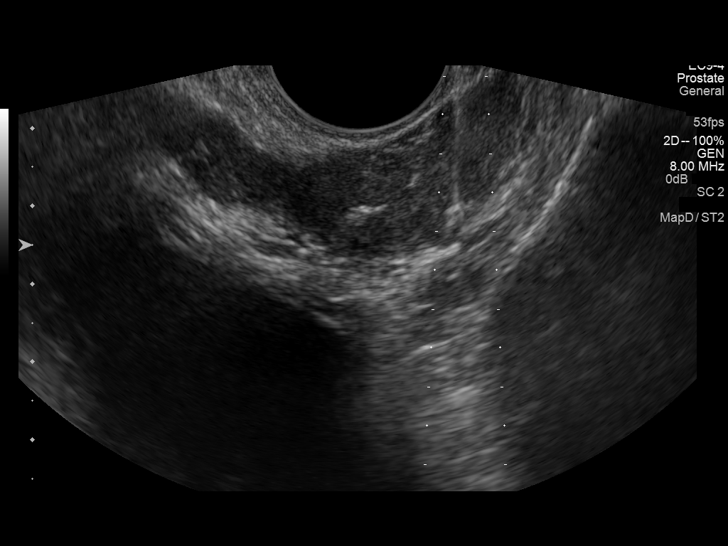
[im 16/20]
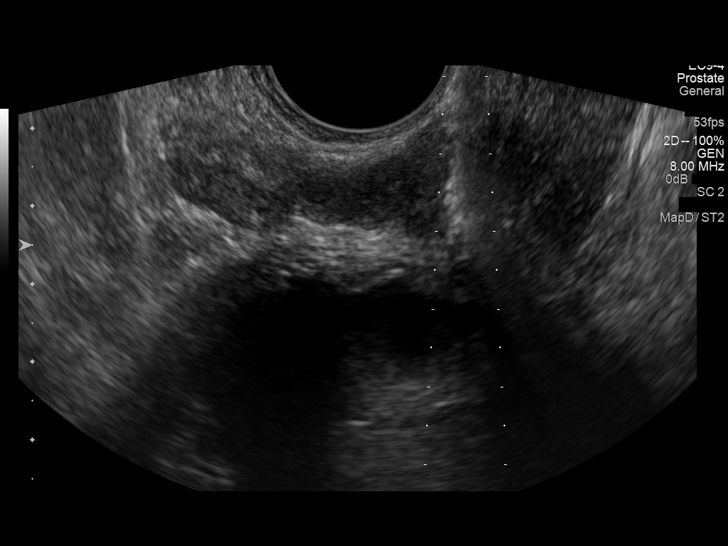
[im 17/20]
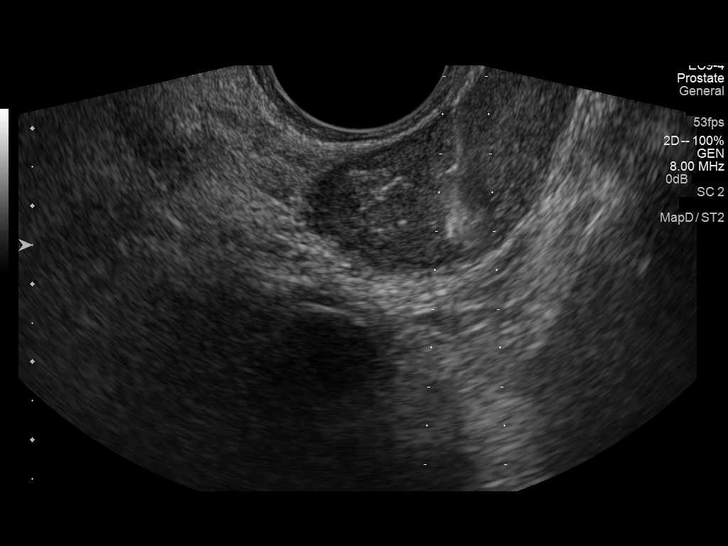
[im 18/20]
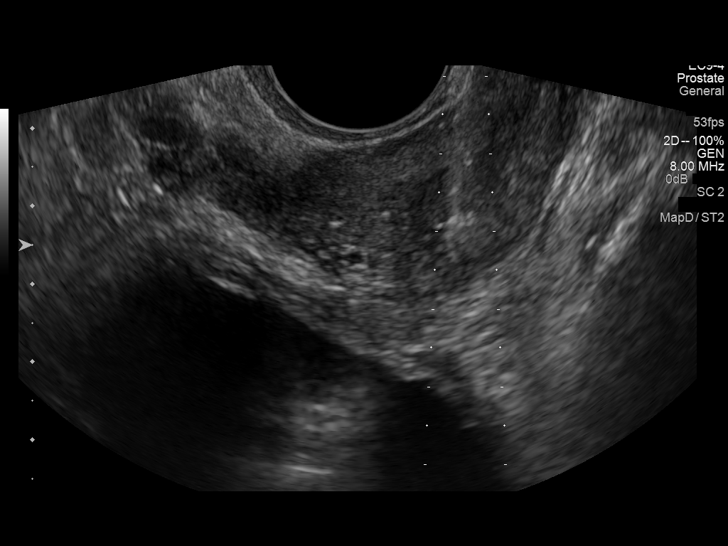
[im 20/20]
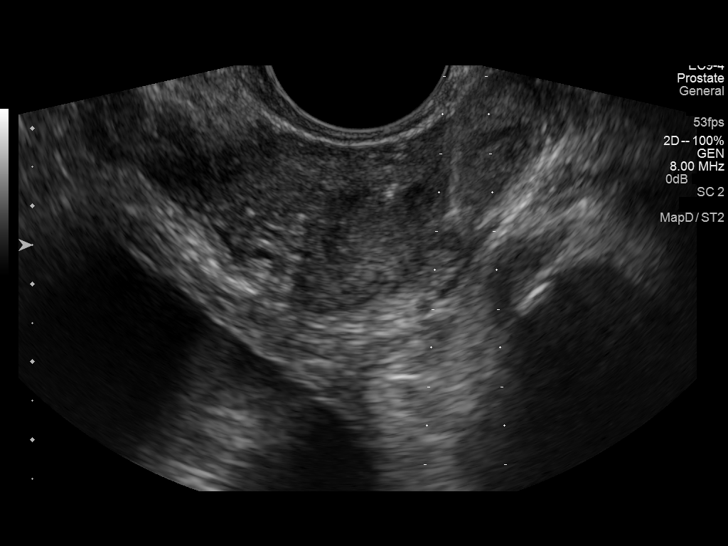

[14 of 16 positions shown; findings below may reference images not displayed]

Canned report from images found in remote index.

Refer to host system for actual result text.

## 2014-07-22 MED ORDER — LIDOCAINE HCL (PF) 2 % IJ SOLN
INTRAMUSCULAR | Status: AC
  Administered 2014-07-22: 10 mL
  Filled 2014-07-22: qty 10

## 2014-07-22 MED ORDER — LIDOCAINE HCL (PF) 2 % IJ SOLN
10.0000 mL | Freq: Once | INTRAMUSCULAR | Status: AC
  Administered 2014-07-22: 10 mL

## 2014-07-22 MED ORDER — GENTAMICIN SULFATE 40 MG/ML IJ SOLN
INTRAMUSCULAR | Status: AC
  Administered 2014-07-22: 160 mg via INTRAMUSCULAR
  Filled 2014-07-22: qty 4

## 2014-07-22 MED ORDER — GENTAMICIN SULFATE 40 MG/ML IJ SOLN
160.0000 mg | Freq: Once | INTRAMUSCULAR | Status: AC
  Administered 2014-07-22: 160 mg via INTRAMUSCULAR

## 2014-07-22 NOTE — Discharge Instructions (Signed)
Transrectal Ultrasound-Guided Biopsy °A transrectal ultrasound-guided biopsy is a procedure to take samples of tissue from your prostate. Ultrasound images are used to guide the procedure. It is usually done to check the prostate gland for cancer. °BEFORE THE PROCEDURE °· Do not eat or drink after midnight on the night before your procedure. °· Take medicines as your doctor tells you. °· Your doctor may have you stop taking some medicines 5-7 days before the procedure. °· You will be given an enema before your procedure. During an enema, a liquid is put into your butt (rectum) to clear out waste. °· You may have lab tests the day of your procedure. °· Make plans to have someone drive you home. °PROCEDURE °· You will be given medicine to help you relax before the procedure. An IV tube will be put into one of your veins. It will be used to give fluids and medicine. °· You will be given medicine to reduce the risk of infection (antibiotic). °· You will be placed on your side. °· A probe with gel will be put in your butt. This is used to take pictures of your prostate and the area around it. °· A medicine to numb the area is put into your prostate. °· A biopsy needle is then inserted and guided to your prostate. °· Samples of prostate tissue are taken. The needle is removed. °· The samples are sent to a lab to be checked. Results are usually back in 2-3 days. °AFTER THE PROCEDURE °· You will be taken to a room where you will be watched until you are doing okay. °· You may have some pain in the area around your butt. You will be given medicines for this. °· You may be able to go home the same day. Sometimes, an overnight stay in the hospital is needed. °Document Released: 09/21/2009 Document Revised: 10/08/2013 Document Reviewed: 05/22/2013 °ExitCare® Patient Information ©2015 [employer]. This information is not intended to replace advice given to you by your health care provider. Make sure you discuss any questions  you have with your health care provider. ° °

## 2014-07-22 NOTE — Progress Notes (Signed)
Prostate biopsy complete no signs of distress.  

## 2014-08-05 ENCOUNTER — Ambulatory Visit (INDEPENDENT_AMBULATORY_CARE_PROVIDER_SITE_OTHER): Payer: BC Managed Care – PPO | Admitting: Urology

## 2014-08-05 DIAGNOSIS — C61 Malignant neoplasm of prostate: Secondary | ICD-10-CM

## 2014-08-08 ENCOUNTER — Ambulatory Visit (INDEPENDENT_AMBULATORY_CARE_PROVIDER_SITE_OTHER): Payer: BC Managed Care – PPO | Admitting: Urology

## 2014-08-08 DIAGNOSIS — C61 Malignant neoplasm of prostate: Secondary | ICD-10-CM

## 2014-08-08 DIAGNOSIS — K429 Umbilical hernia without obstruction or gangrene: Secondary | ICD-10-CM

## 2014-08-08 DIAGNOSIS — N529 Male erectile dysfunction, unspecified: Secondary | ICD-10-CM

## 2014-08-25 ENCOUNTER — Ambulatory Visit (INDEPENDENT_AMBULATORY_CARE_PROVIDER_SITE_OTHER): Payer: Self-pay | Admitting: General Surgery

## 2014-08-25 NOTE — H&P (Signed)
History of Present Illness Ralene Ok MD; 08/25/2014 10:08 AM) Patient words: hernia.  The patient is a 63 year old male who presents with an umbilical hernia. 63 year old male who is referred by Dr. Roni Bread for evaluation of an umbilical hernia. Patient is to undergo robotic prostatectomy. Patient states she's had this umbilical hernia for most of his life. He states he has no pain. He states that secondary to undergoing surgery he would like to have this repaired at that time. Patient has no signs or symptoms of incarceration or strangulation.   Other Problems Erasmo Leventhal, RN, BSN; 08/25/2014 9:39 AM) Hemorrhoids High blood pressure  Past Surgical History Erasmo Leventhal, RN, BSN; 08/25/2014 9:39 AM) Colon Polyp Removal - Colonoscopy Colon Polyp Removal - Open Hemorrhoidectomy  Diagnostic Studies History Erasmo Leventhal, RN, BSN; 08/25/2014 9:39 AM) Colonoscopy within last year  Allergies Erasmo Leventhal, RN, BSN; 08/25/2014 9:43 AM) No Known Drug Allergies11/06/2014  Medication History Erasmo Leventhal, RN, BSN; 08/25/2014 9:44 AM) Tylenol 8 Hour (650MG  Tablet ER, Oral as needed) Active. Advil (200MG  Capsule, Oral as needed) Active. Hyzaar (50-12.5MG  Tablet, Oral daily) Active. Medications Reconciled  Social History Multimedia programmer, RN, BSN; 08/25/2014 9:39 AM) Alcohol use Moderate alcohol use. Caffeine use Carbonated beverages, Coffee, Tea. Illicit drug use Remotely quit drug use. Tobacco use Current every day smoker.  Family History (Erasmo Leventhal, RN, BSN; 08/25/2014 9:39 AM) Hypertension Mother.  Review of Systems Occupational hygienist, BSN; 08/25/2014 9:39 AM) General Not Present- Appetite Loss, Chills, Fatigue, Fever, Night Sweats, Weight Gain and Weight Loss. Skin Not Present- Change in Wart/Mole, Dryness, Hives, Jaundice, New Lesions, Non-Healing Wounds, Rash and Ulcer. HEENT Not Present- Earache, Hearing Loss, Hoarseness, Nose Bleed, Oral  Ulcers, Ringing in the Ears, Seasonal Allergies, Sinus Pain, Sore Throat, Visual Disturbances, Wears glasses/contact lenses and Yellow Eyes. Respiratory Present- Snoring. Not Present- Bloody sputum, Chronic Cough, Difficulty Breathing and Wheezing. Breast Not Present- Breast Mass, Breast Pain, Nipple Discharge and Skin Changes. Cardiovascular Not Present- Chest Pain, Difficulty Breathing Lying Down, Leg Cramps, Palpitations, Rapid Heart Rate, Shortness of Breath and Swelling of Extremities. Gastrointestinal Not Present- Abdominal Pain, Bloating, Bloody Stool, Change in Bowel Habits, Chronic diarrhea, Constipation, Difficulty Swallowing, Excessive gas, Gets full quickly at meals, Hemorrhoids, Indigestion, Nausea, Rectal Pain and Vomiting. Male Genitourinary Present- Impotence. Not Present- Blood in Urine, Change in Urinary Stream, Frequency, Nocturia, Painful Urination, Urgency and Urine Leakage. Musculoskeletal Present- Joint Pain. Not Present- Back Pain, Joint Stiffness, Muscle Pain, Muscle Weakness and Swelling of Extremities. Neurological Not Present- Decreased Memory, Fainting, Headaches, Numbness, Seizures, Tingling, Tremor, Trouble walking and Weakness. Psychiatric Not Present- Anxiety, Bipolar, Change in Sleep Pattern, Depression, Fearful and Frequent crying. Endocrine Not Present- Cold Intolerance, Excessive Hunger, Hair Changes, Heat Intolerance, Hot flashes and New Diabetes. Hematology Present- Gland problems. Not Present- Easy Bruising, Excessive bleeding, HIV and Persistent Infections.   Vitals Occupational hygienist, BSN; 08/25/2014 9:43 AM) 08/25/2014 9:42 AM Weight: 225.4 lb Height: 69in Body Surface Area: 2.23 m Body Mass Index: 33.29 kg/m Temp.: 98.55F(Oral)  Pulse: 68 (Regular)  Resp.: 20 (Unlabored)  BP: 122/90 (Sitting, Left Arm, Standard)    Physical Exam Ralene Ok MD; 08/25/2014 10:08 AM) General Mental Status-Alert. General Appearance-Consistent  with stated age. Hydration-Well hydrated. Voice-Normal.  Head and Neck Head-normocephalic, atraumatic with no lesions or palpable masses. Trachea-midline.  Eye Eyeball - Bilateral-Extraocular movements intact. Sclera/Conjunctiva - Bilateral-No scleral icterus.  Chest and Lung Exam Chest and lung exam reveals -quiet, even and easy respiratory effort with no use of  accessory muscles and on auscultation, normal breath sounds, no adventitious sounds and normal vocal resonance. Inspection Chest Wall - Normal. Back - normal.  Cardiovascular Cardiovascular examination reveals -normal heart sounds, regular rate and rhythm with no murmurs and normal pedal pulses bilaterally.  Abdomen Inspection Skin - Scar - no surgical scars. Hernias - Ventral - Reducible. Palpation/Percussion Palpation and Percussion of the abdomen reveal - Soft, Non Tender, No Rebound tenderness, No Rigidity (guarding) and No hepatosplenomegaly. Auscultation Auscultation of the abdomen reveals - Bowel sounds normal.  Neurologic Neurologic evaluation reveals -alert and oriented x 3 with no impairment of recent or remote memory. Mental Status-Normal.  Musculoskeletal Normal Exam - Left-Upper Extremity Strength Normal and Lower Extremity Strength Normal. Normal Exam - Right-Upper Extremity Strength Normal, Lower Extremity Weakness.    Assessment & Plan Ralene Ok MD; 26/01/1582 09:40 AM) UMBILICAL HERNIA WITHOUT OBSTRUCTION AND WITHOUT GANGRENE (553.1  K42.9) Impression: 63 year old male with an umbilical hernia.  We'll coordinate laparoscopic umbilical hernia repair surgery with Dr. Jethro Poling office examination with his prostate surgery.  All risks and benefits were discussed with the patient to generally include, but not limited to: infection, bleeding, damage to surrounding structures, acute and chronic nerve pain, and recurrence. Alternatives were offered and described. All questions  were answered and the patient voiced understanding of the procedure and wishes to proceed at this point with hernia repair.

## 2014-09-17 ENCOUNTER — Other Ambulatory Visit: Payer: Self-pay | Admitting: Urology

## 2014-09-23 ENCOUNTER — Other Ambulatory Visit (HOSPITAL_COMMUNITY): Payer: Self-pay | Admitting: *Deleted

## 2014-09-23 NOTE — Patient Instructions (Addendum)
Dustin [name]  09/23/2014   Your procedure is scheduled on: Wednesday Oct 01, 2014  Report to [facility]  Entrance and follow signs to               [place] at 1015 AM.  Call this number if you have problems the morning of surgery [phone]   Remember: Follow all bowel prep instructions from dr Jeffie Pollock  Do not eat food :After Midnight Monday night, clear liquids all day Tuesday, no clear liquids after midnight Tuesday night.     Take these medicines the morning of surgery with A SIP OF WATER: eye drop                               You may not have any metal on your body including hair pins and              piercings  Do not wear jewelry, make-up, lotions, powders or perfumes.             Do not wear nail polish.  Do not shave  48 hours prior to surgery.              Men may shave face and neck.   Do not bring valuables to the hospital. [place].  Contacts, dentures or bridgework may not be worn into surgery.  Leave suitcase in the car. After surgery it may be brought to your room.     Patients discharged the day of surgery will not be allowed to drive home.  Name and phone number of your driver:  Special Instructions: N/A              Please read over the following fact sheets you were given: _____________________________________________________________________             [facility] - Preparing for Surgery Before surgery, you can play an important role.  Because skin is not sterile, your skin needs to be as free of germs as possible.  You can reduce the number of germs on your skin by washing with CHG (chlorahexidine gluconate) soap before surgery.  CHG is an antiseptic cleaner which kills germs and bonds with the skin to continue killing germs even after washing. Please DO NOT use if you have an allergy to CHG or antibacterial soaps.  If your skin becomes reddened/irritated stop  using the CHG and inform your nurse when you arrive at Short Stay. Do not shave (including legs and underarms) for at least 48 hours prior to the first CHG shower.  You may shave your face/neck. Please follow these instructions carefully:  1.  Shower with CHG Soap the night before surgery and the  morning of Surgery.  2.  If you choose to wash your hair, wash your hair first as usual with your  normal  shampoo.  3.  After you shampoo, rinse your hair and body thoroughly to remove the  shampoo.                           4.  Use CHG as you would any other liquid soap.  You can apply chg directly  to the skin and  wash                       Gently with a scrungie or clean washcloth.  5.  Apply the CHG Soap to your body ONLY FROM THE NECK DOWN.   Do not use on face/ open                           Wound or open sores. Avoid contact with eyes, ears mouth and genitals (private parts).                       Wash face,  Genitals (private parts) with your normal soap.             6.  Wash thoroughly, paying special attention to the area where your surgery  will be performed.  7.  Thoroughly rinse your body with warm water from the neck down.  8.  DO NOT shower/wash with your normal soap after using and rinsing off  the CHG Soap.                9.  Pat yourself dry with a clean towel.            10.  Wear clean pajamas.            11.  Place clean sheets on your bed the night of your first shower and do not  sleep with pets. Day of Surgery : Do not apply any lotions/deodorants the morning of surgery.  Please wear clean clothes to the hospital/surgery center.  FAILURE TO FOLLOW THESE INSTRUCTIONS MAY RESULT IN THE CANCELLATION OF YOUR SURGERY PATIENT SIGNATURE_________________________________  NURSE SIGNATURE__________________________________  ________________________________________________________________________

## 2014-09-24 ENCOUNTER — Encounter (HOSPITAL_COMMUNITY): Payer: Self-pay

## 2014-09-24 ENCOUNTER — Other Ambulatory Visit: Payer: Self-pay

## 2014-09-24 ENCOUNTER — Encounter (HOSPITAL_COMMUNITY)
Admission: RE | Admit: 2014-09-24 | Discharge: 2014-09-24 | Disposition: A | Discharge status: Home / Self Care | Payer: BC Managed Care – PPO | Source: Ambulatory Visit | Attending: Urology | Admitting: Urology

## 2014-09-24 ENCOUNTER — Ambulatory Visit (HOSPITAL_COMMUNITY)
Admission: RE | Admit: 2014-09-24 | Discharge: 2014-09-24 | Disposition: A | Discharge status: Home / Self Care | Payer: BC Managed Care – PPO | Source: Ambulatory Visit | Attending: Anesthesiology | Admitting: Anesthesiology

## 2014-09-24 DIAGNOSIS — Z01812 Encounter for preprocedural laboratory examination: Secondary | ICD-10-CM | POA: Diagnosis not present

## 2014-09-24 DIAGNOSIS — I10 Essential (primary) hypertension: Secondary | ICD-10-CM

## 2014-09-24 DIAGNOSIS — K429 Umbilical hernia without obstruction or gangrene: Secondary | ICD-10-CM | POA: Insufficient documentation

## 2014-09-24 DIAGNOSIS — Z01818 Encounter for other preprocedural examination: Secondary | ICD-10-CM | POA: Insufficient documentation

## 2014-09-24 HISTORY — DX: Unspecified osteoarthritis, unspecified site: M19.90

## 2014-09-24 HISTORY — DX: Pain in unspecified joint: M25.50

## 2014-09-24 LAB — BASIC METABOLIC PANEL
ANION GAP: 11 (ref 5–15)
BUN: 11 mg/dL (ref 6–23)
CALCIUM: 10.5 mg/dL (ref 8.4–10.5)
CHLORIDE: 98 meq/L (ref 96–112)
CO2: 27 mEq/L (ref 19–32)
CREATININE: 0.81 mg/dL (ref 0.50–1.35)
Glucose, Bld: 100 mg/dL — ABNORMAL HIGH (ref 70–99)
Potassium: 3.8 mEq/L (ref 3.7–5.3)
Sodium: 136 mEq/L — ABNORMAL LOW (ref 137–147)

## 2014-09-24 LAB — CBC
HCT: 43.1 % (ref 39.0–52.0)
Hemoglobin: 14.3 g/dL (ref 13.0–17.0)
MCH: 28 pg (ref 26.0–34.0)
MCHC: 33.2 g/dL (ref 30.0–36.0)
MCV: 84.3 fL (ref 78.0–100.0)
PLATELETS: 243 10*3/uL (ref 150–400)
RBC: 5.11 MIL/uL (ref 4.22–5.81)
RDW: 12.5 % (ref 11.5–15.5)
WBC: 4.8 10*3/uL (ref 4.0–10.5)

## 2014-09-24 IMAGING — CR DG CHEST 2V
2 series · 2 of 2 positions shown · non-contrast
Comparison: None.

CLINICAL DATA: Hypertension.

EXAM:
CHEST  2 VIEW

[w chest pa]
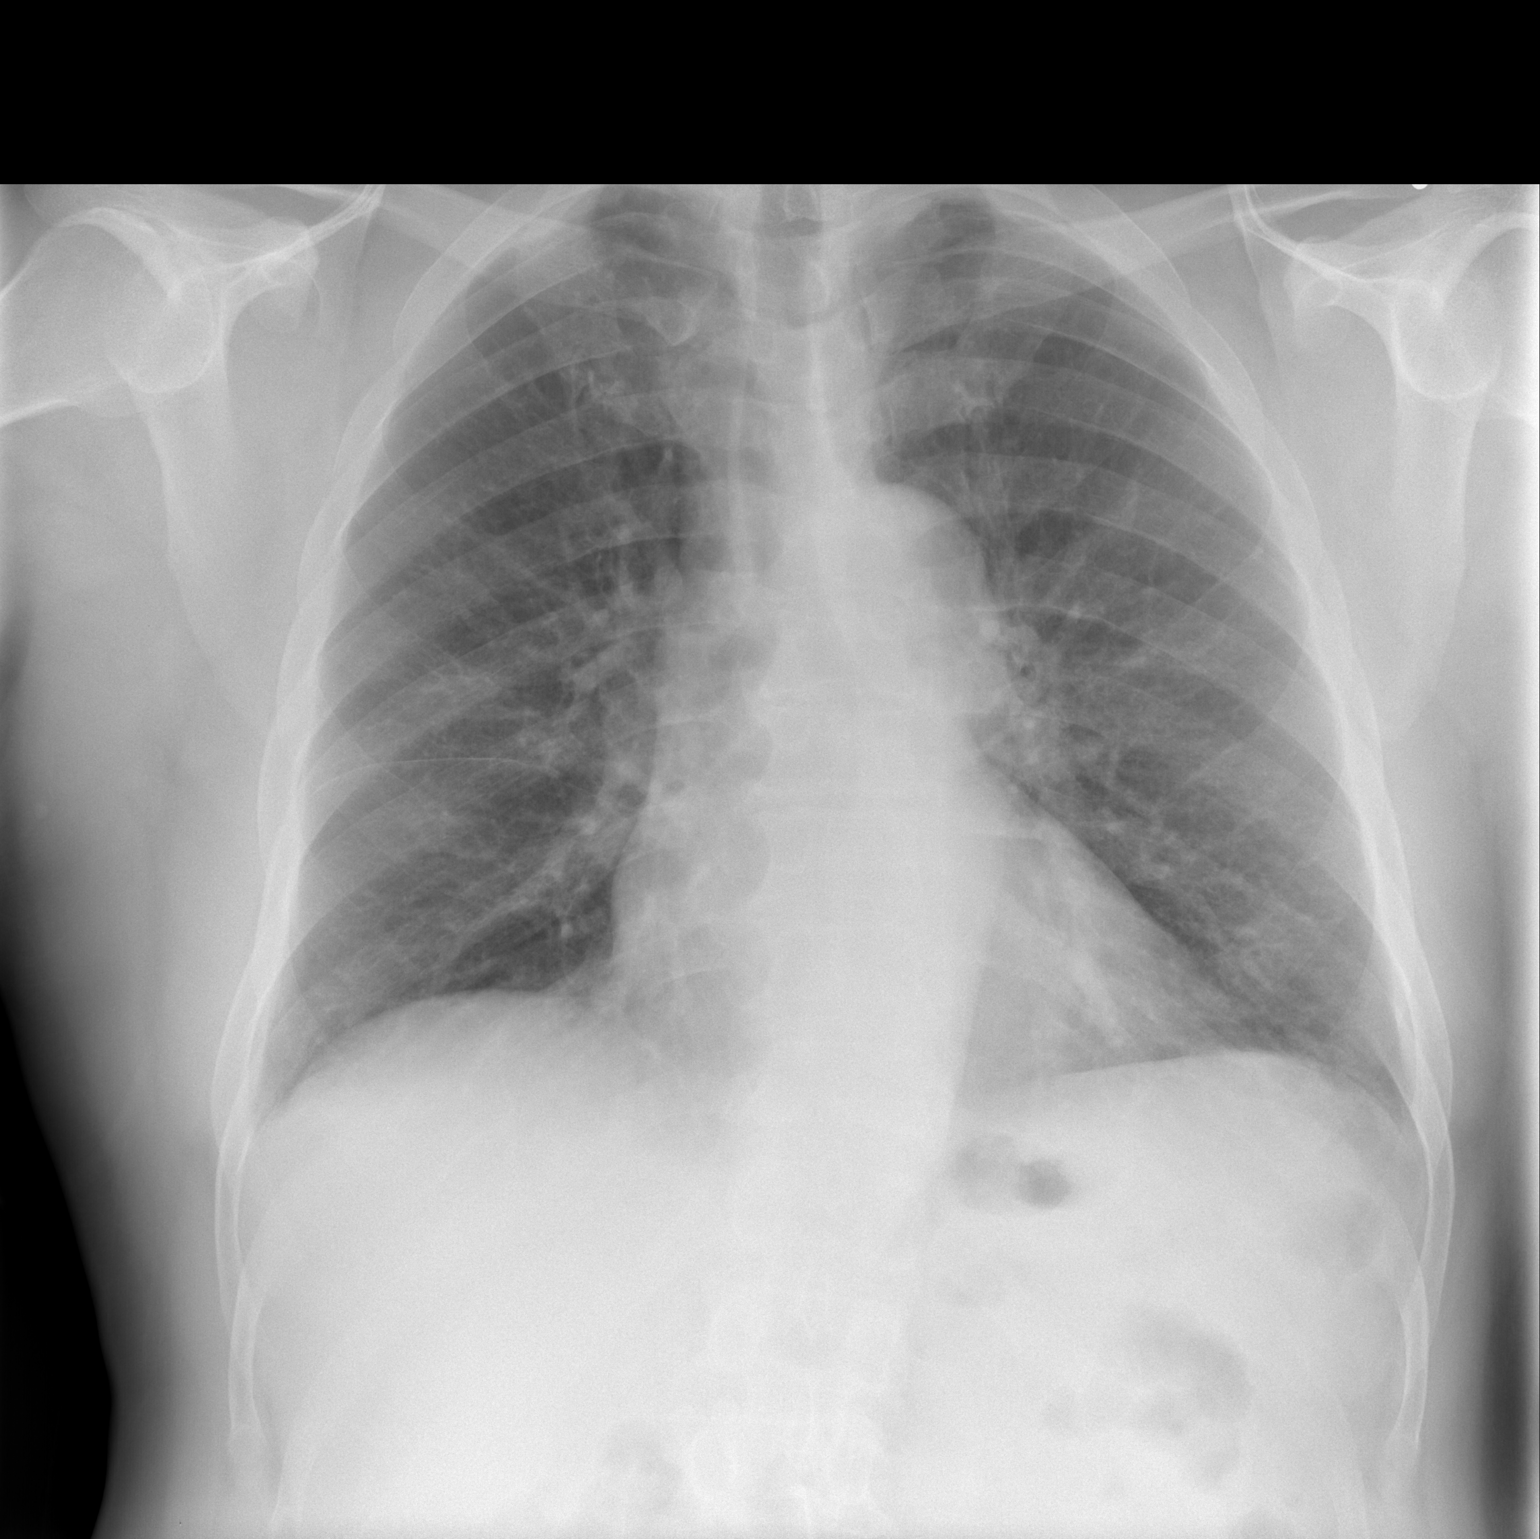

[w chest lat]
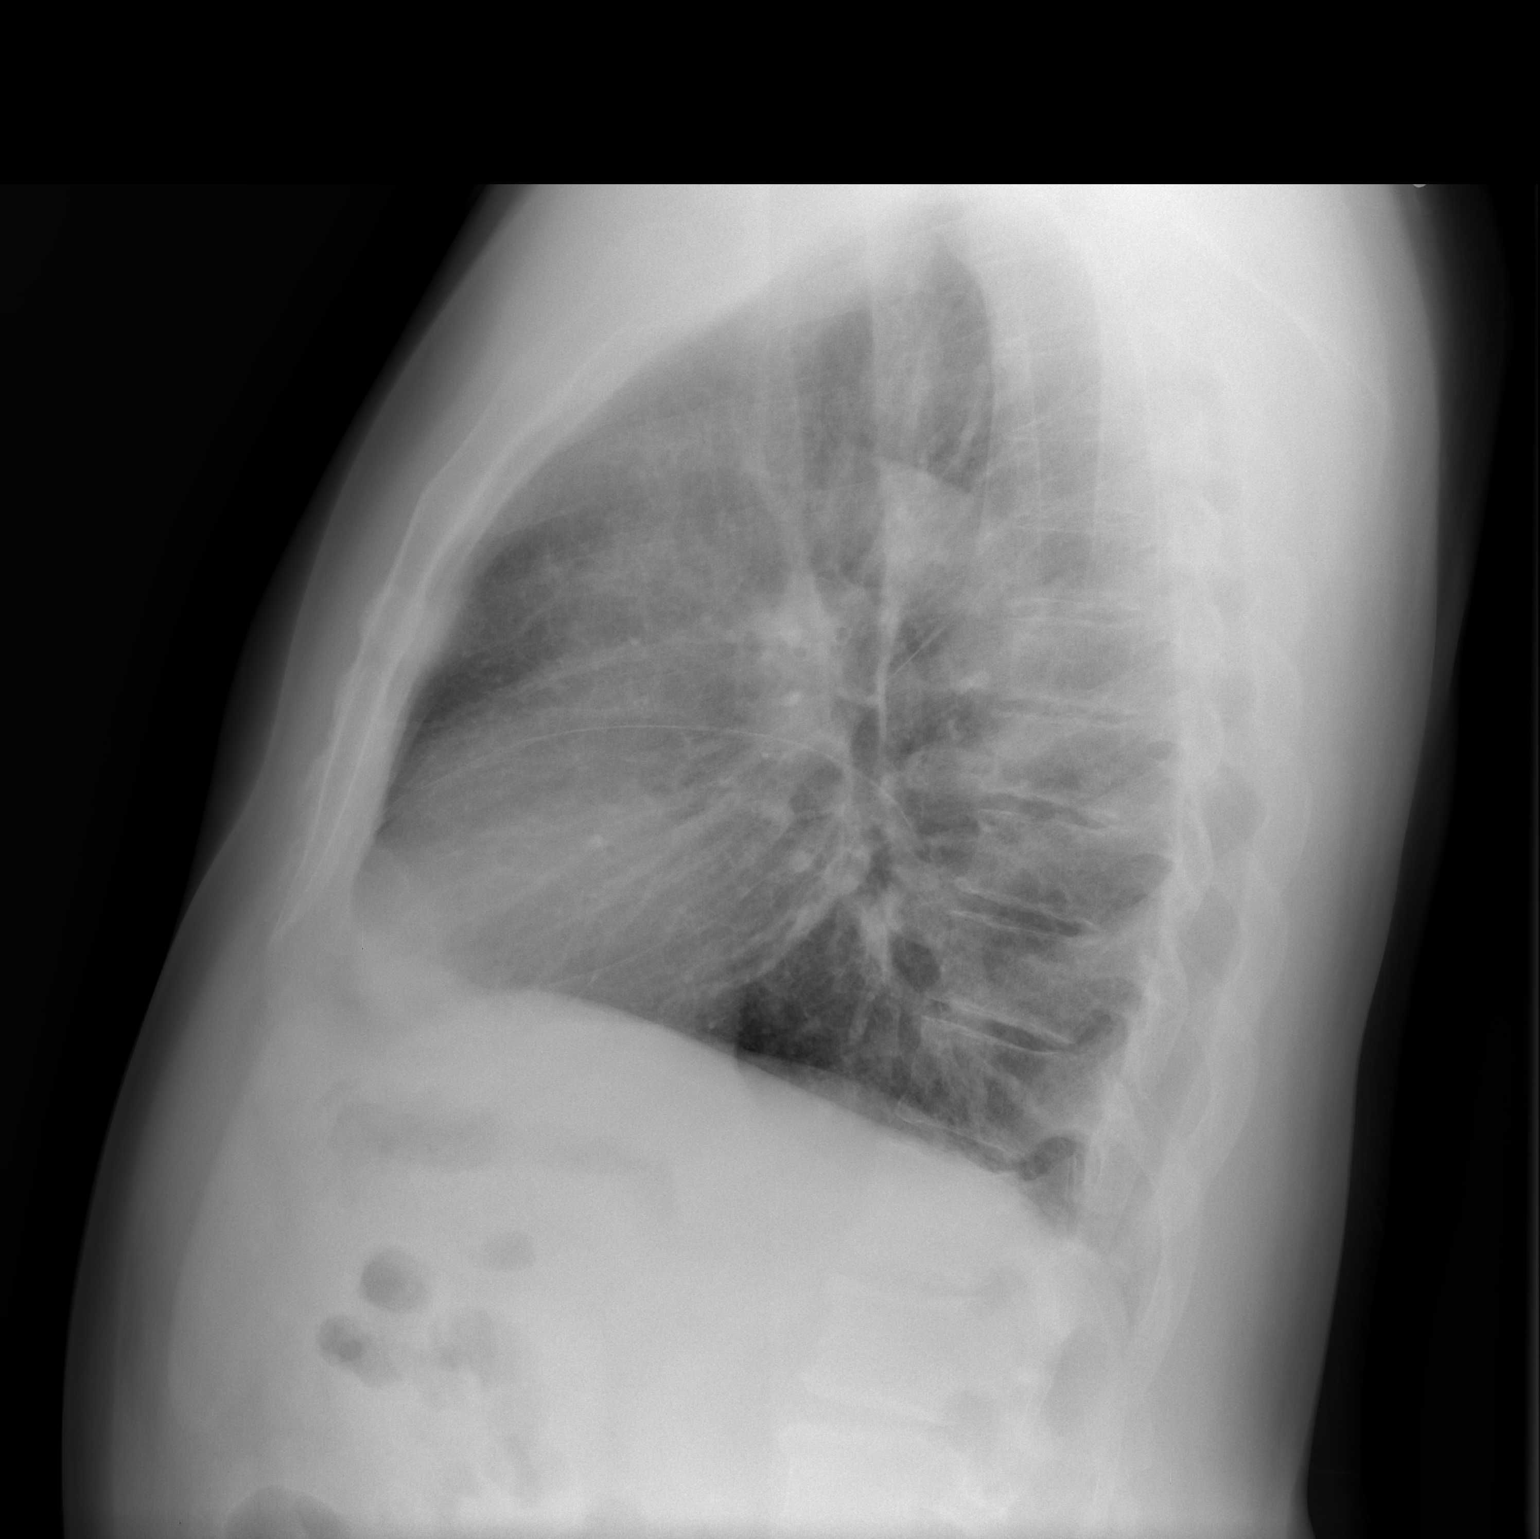

[2 of 2 positions shown; findings below may reference images not displayed]

FINDINGS: Mediastinum hilar structures normal. Lungs are clear. Heart size
normal. No pleural effusion or pneumothorax. No acute bony
abnormality.
IMPRESSION: No active cardiopulmonary disease.

## 2014-09-29 NOTE — Progress Notes (Signed)
Final EKG done 09/24/2014 in [facility].

## 2014-09-30 NOTE — H&P (Signed)
Active Problems  1. Adenocarcinoma of prostate (C61)  2. Erectile dysfunction (N52.9)  3. PSA elevation (R97.2)  4. Umbilical hernia (Y81.4)  History of Present Illness  Mr. Buffalo is a 63 yo BM patient of Dr. Diona Fanti with a recent diagnosis of T2a Gleason 7 prostate cancer and has been recommended to consider surgery.  He has an IPSS of 8 and a SHIM of 13.  His prostate volume is 47 and his PSA was 5.9.  He has a right sided nodule.   Past Medical History  1. History of arthritis (Z87.39)  2. History of hypertension (Z86.79)  Surgical History  1. History of Hemorrhoidectomy  Current Meds  1. Gentamicin Sulfate 40 MG/ML Injection Solution; INJECT 160  MG Intramuscular before  procedure; To Be Done: With next appointment; Status: HOLD FOR - Administration  Ordered  2. Ibuprofen TABS;  Therapy: (Recorded:01Sep2015) to Recorded  3. Losartan Potassium-HCTZ 50-12.5 MG Oral Tablet;  Therapy: (Recorded:01Sep2015) to Recorded  4. Sildenafil Citrate 20 MG Oral Tablet; take 2-5 tabs as needed;  Therapy: 01Sep2015 to (Last Rx:01Sep2015)  Requested for: 01Sep2015 Ordered  Allergies  1. No Known Drug Allergies  Family History  1. Family history of Deceased : Mother, Father  2. Family history of cardiac disorder (Z82.49) : Mother  3. Family history of hypertension (Z82.49) : Mother  4. Family history of lung cancer (Z80.1) : Father  Social History  1. Caffeine use (F15.90)   2p/d  2. Married  3. No alcohol use  4. Occupation   Museum/gallery conservator at a [employer]  5. Smokes cigarettes (F17.210)   1/2 pk. p/d. for 35 yrs.   [place] reviewed and updated.   Review of Systems Genitourinary, constitutional, skin, eye, otolaryngeal, hematologic/lymphatic, cardiovascular, pulmonary, endocrine, musculoskeletal, gastrointestinal, neurological and psychiatric system(s) were reviewed and pertinent findings if present are noted.  and are otherwise negative.  Genitourinary: erectile dysfunction (He  has been given sildenafil but 50mg  isn't working. ).    Vitals Vital Signs [Data Includes: Last 1 Day]  Recorded: 23Oct2015 10:30AM  Blood Pressure: 134 / 85 Temperature: 98.5 F Heart Rate: 79  Physical Exam Constitutional: Well nourished and well developed . No acute distress.  ENT:. The ears and nose are normal in appearance.  Neck: The appearance of the neck is normal and no neck mass is present.  Pulmonary: No respiratory distress and normal respiratory rhythm and effort.  Cardiovascular: Heart rate and rhythm are normal . No peripheral edema.  Abdomen: The abdomen is mildly obese. The abdomen is soft and nontender. No masses are palpated. No CVA tenderness.  An umbilical hernia is present, which is reducible.  It is about 4cm superior to the umbilicus.  No inguinal hernia is present on the right.  No inguinal hernia is present on the left. No hepatosplenomegaly noted.  Rectal: Rectal exam demonstrates normal sphincter tone, no tenderness and no masses. Estimated prostate size is 2+. The prostate has a palpable nodule involving the right, apex of the prostate and is not tender. The left seminal vesicle is nonpalpable. The right seminal vesicle is nonpalpable. The perineum is normal on inspection.  Genitourinary: Examination of the penis demonstrates no discharge, no masses, no lesions and a normal meatus. The penis is uncircumcised. The scrotum is without lesions. The right epididymis is palpably normal and non-tender. The left epididymis is palpably normal and non-tender. The right testis is non-tender and without masses. The left testis is non-tender and without masses.  Lymphatics: The supraclavicular, femoral and  inguinal nodes are not enlarged or tender.  Skin: Normal skin turgor, no visible rash and no visible skin lesions.  Normal sensation of the perineum/perianal region (S3,4,5).    Results/Data  Path report reviewed.    Assessment  1. Adenocarcinoma of prostate (C61)  2.  Erectile dysfunction (N52.9)  3. Umbilical hernia (H54.5)   He has T2a Nx Mx intermediate risk prostate cancer with mild LUTs and moderate to severe ED that hasn't responded to Viagra. He has a large but assymptomatic umbilical hernia.   Plan Adenocarcinoma of prostate   1. Follow-up Schedule Surgery Office  Follow-up  Status: Hold For - Appointment   Requested for: [id number] Umbilical hernia   2. General Surgery Referral Referral  Referral  Status: Hold For - Appointment,Records   Requested for: 23Oct2015   He has reviewed the treatment options with Dr. Diona Fanti and has elected surgical therapy.   He has the hernia which will need to be repaired at the time of the robotic prostatectomy.  I will schedule him for Robotic Prostatectomy with BPLND and bilateral nerve sparing.  The risks of bleeding, infection, intraabdominal injury including a rectal injury with possible need for colostomy, hernia formation, obturator nerve injury, ED, incontinence, neuropraxia, thrombotic events and anesthetic complications reviewed.   Discussion/Summary  CC: Dr. Claudia Desanctis and  Dr. Rosita Fire.

## 2014-10-01 ENCOUNTER — Inpatient Hospital Stay (HOSPITAL_COMMUNITY): Payer: BC Managed Care – PPO | Admitting: Anesthesiology

## 2014-10-01 ENCOUNTER — Encounter (HOSPITAL_COMMUNITY): Payer: Self-pay | Admitting: *Deleted

## 2014-10-01 ENCOUNTER — Encounter (HOSPITAL_COMMUNITY)
Admission: RE | Disposition: A | Discharge status: Home / Self Care | Payer: Self-pay | Source: Ambulatory Visit | Attending: Urology

## 2014-10-01 ENCOUNTER — Inpatient Hospital Stay (HOSPITAL_COMMUNITY)
Admission: RE | Admit: 2014-10-01 | Discharge: 2014-10-04 | DRG: 708 | Disposition: A | Discharge status: Home / Self Care | Payer: BC Managed Care – PPO | Source: Ambulatory Visit | Attending: Urology | Admitting: Urology

## 2014-10-01 DIAGNOSIS — C61 Malignant neoplasm of prostate: Principal | ICD-10-CM | POA: Diagnosis present

## 2014-10-01 DIAGNOSIS — I10 Essential (primary) hypertension: Secondary | ICD-10-CM | POA: Diagnosis present

## 2014-10-01 DIAGNOSIS — N529 Male erectile dysfunction, unspecified: Secondary | ICD-10-CM | POA: Diagnosis present

## 2014-10-01 DIAGNOSIS — F17210 Nicotine dependence, cigarettes, uncomplicated: Secondary | ICD-10-CM | POA: Diagnosis present

## 2014-10-01 DIAGNOSIS — K429 Umbilical hernia without obstruction or gangrene: Secondary | ICD-10-CM | POA: Diagnosis present

## 2014-10-01 HISTORY — PX: ROBOT ASSISTED LAPAROSCOPIC RADICAL PROSTATECTOMY: SHX5141

## 2014-10-01 HISTORY — PX: LYMPH NODE DISSECTION: SHX5087

## 2014-10-01 HISTORY — PX: UMBILICAL HERNIA REPAIR: SHX196

## 2014-10-01 LAB — HEMOGLOBIN AND HEMATOCRIT, BLOOD
HEMATOCRIT: 42.1 % (ref 39.0–52.0)
HEMOGLOBIN: 14.2 g/dL (ref 13.0–17.0)

## 2014-10-01 LAB — ABO/RH: ABO/RH(D): B POS

## 2014-10-01 LAB — TYPE AND SCREEN
ABO/RH(D): B POS
ANTIBODY SCREEN: NEGATIVE

## 2014-10-01 SURGERY — ROBOTIC ASSISTED LAPAROSCOPIC RADICAL PROSTATECTOMY
Anesthesia: General

## 2014-10-01 MED ORDER — LOSARTAN POTASSIUM 50 MG PO TABS
50.0000 mg | ORAL_TABLET | Freq: Every day | ORAL | Status: DC
  Administered 2014-10-02 – 2014-10-04 (×3): 50 mg via ORAL
  Filled 2014-10-01 (×3): qty 1

## 2014-10-01 MED ORDER — GLYCOPYRROLATE 0.2 MG/ML IJ SOLN
INTRAMUSCULAR | Status: DC | PRN
  Administered 2014-10-01: 0.6 mg via INTRAVENOUS

## 2014-10-01 MED ORDER — SODIUM CHLORIDE 0.9 % IR SOLN
Status: DC | PRN
  Administered 2014-10-01: 1000 mL via INTRAVESICAL

## 2014-10-01 MED ORDER — FENTANYL CITRATE 0.05 MG/ML IJ SOLN
INTRAMUSCULAR | Status: AC
  Filled 2014-10-01: qty 5

## 2014-10-01 MED ORDER — LACTATED RINGERS IV SOLN
INTRAVENOUS | Status: DC | PRN
  Administered 2014-10-01 (×3): via INTRAVENOUS

## 2014-10-01 MED ORDER — STERILE WATER FOR IRRIGATION IR SOLN
Status: DC | PRN
  Administered 2014-10-01: 3000 mL

## 2014-10-01 MED ORDER — CIPROFLOXACIN HCL 500 MG PO TABS: 500.0000 mg | ORAL_TABLET | Freq: Two times a day (BID) | ORAL | Status: DC

## 2014-10-01 MED ORDER — BUPIVACAINE-EPINEPHRINE (PF) 0.25% -1:200000 IJ SOLN
INTRAMUSCULAR | Status: AC
  Filled 2014-10-01: qty 30

## 2014-10-01 MED ORDER — FENTANYL CITRATE 0.05 MG/ML IJ SOLN
INTRAMUSCULAR | Status: AC
  Filled 2014-10-01: qty 2

## 2014-10-01 MED ORDER — LACTATED RINGERS IR SOLN
Status: DC | PRN
  Administered 2014-10-01: 1000 mL

## 2014-10-01 MED ORDER — CEFAZOLIN SODIUM-DEXTROSE 2-3 GM-% IV SOLR
2.0000 g | INTRAVENOUS | Status: AC
  Administered 2014-10-01 (×2): 2 g via INTRAVENOUS

## 2014-10-01 MED ORDER — CEFAZOLIN SODIUM-DEXTROSE 2-3 GM-% IV SOLR: 2.0000 g | INTRAVENOUS | Status: DC

## 2014-10-01 MED ORDER — LIDOCAINE HCL (CARDIAC) 20 MG/ML IV SOLN
INTRAVENOUS | Status: DC | PRN
  Administered 2014-10-01: 75 mg via INTRAVENOUS

## 2014-10-01 MED ORDER — MIDAZOLAM HCL 2 MG/2ML IJ SOLN
INTRAMUSCULAR | Status: AC
  Filled 2014-10-01: qty 2

## 2014-10-01 MED ORDER — ACETAMINOPHEN 10 MG/ML IV SOLN
1000.0000 mg | Freq: Once | INTRAVENOUS | Status: AC
  Filled 2014-10-01: qty 100

## 2014-10-01 MED ORDER — LOSARTAN POTASSIUM-HCTZ 50-12.5 MG PO TABS: 1.0000 | ORAL_TABLET | Freq: Every morning | ORAL | Status: DC

## 2014-10-01 MED ORDER — DEXAMETHASONE SODIUM PHOSPHATE 10 MG/ML IJ SOLN
INTRAMUSCULAR | Status: DC | PRN
  Administered 2014-10-01: 10 mg via INTRAVENOUS

## 2014-10-01 MED ORDER — CEFAZOLIN SODIUM-DEXTROSE 2-3 GM-% IV SOLR
INTRAVENOUS | Status: AC
  Filled 2014-10-01: qty 50

## 2014-10-01 MED ORDER — FENTANYL CITRATE 0.05 MG/ML IJ SOLN
INTRAMUSCULAR | Status: DC | PRN
Start: 2014-10-01 — End: 2014-10-01
  Administered 2014-10-01: 50 ug via INTRAVENOUS
  Administered 2014-10-01 (×3): 100 ug via INTRAVENOUS
  Administered 2014-10-01: 50 ug via INTRAVENOUS
  Administered 2014-10-01: 100 ug via INTRAVENOUS

## 2014-10-01 MED ORDER — FENTANYL CITRATE 0.05 MG/ML IJ SOLN
25.0000 ug | INTRAMUSCULAR | Status: DC | PRN
  Administered 2014-10-01: 50 ug via INTRAVENOUS

## 2014-10-01 MED ORDER — PROMETHAZINE HCL 25 MG/ML IJ SOLN
INTRAMUSCULAR | Status: AC
  Filled 2014-10-01: qty 1

## 2014-10-01 MED ORDER — HEPARIN SODIUM (PORCINE) 1000 UNIT/ML IJ SOLN
INTRAMUSCULAR | Status: AC
  Filled 2014-10-01: qty 1

## 2014-10-01 MED ORDER — SODIUM CHLORIDE 0.9 % IJ SOLN
INTRAMUSCULAR | Status: AC
  Filled 2014-10-01: qty 10

## 2014-10-01 MED ORDER — FENTANYL CITRATE 0.05 MG/ML IJ SOLN: 25.0000 ug | INTRAMUSCULAR | Status: DC | PRN

## 2014-10-01 MED ORDER — ONDANSETRON HCL 4 MG/2ML IJ SOLN
INTRAMUSCULAR | Status: DC | PRN
  Administered 2014-10-01 (×2): 2 mg via INTRAVENOUS

## 2014-10-01 MED ORDER — CISATRACURIUM BESYLATE (PF) 10 MG/5ML IV SOLN
INTRAVENOUS | Status: DC | PRN
  Administered 2014-10-01: 2 mg via INTRAVENOUS
  Administered 2014-10-01 (×2): 4 mg via INTRAVENOUS
  Administered 2014-10-01: 10 mg via INTRAVENOUS
  Administered 2014-10-01: 4 mg via INTRAVENOUS

## 2014-10-01 MED ORDER — HYDROCHLOROTHIAZIDE 12.5 MG PO CAPS
12.5000 mg | ORAL_CAPSULE | Freq: Every day | ORAL | Status: DC
  Administered 2014-10-02 – 2014-10-04 (×3): 12.5 mg via ORAL
  Filled 2014-10-01 (×3): qty 1

## 2014-10-01 MED ORDER — BUPIVACAINE-EPINEPHRINE 0.25% -1:200000 IJ SOLN
INTRAMUSCULAR | Status: DC | PRN
  Administered 2014-10-01: 20 mL

## 2014-10-01 MED ORDER — HYDROMORPHONE HCL 1 MG/ML IJ SOLN: 0.5000 mg | INTRAMUSCULAR | Status: DC | PRN

## 2014-10-01 MED ORDER — OXYCODONE-ACETAMINOPHEN 5-325 MG PO TABS
1.0000 | ORAL_TABLET | ORAL | Status: DC | PRN
  Administered 2014-10-01: 2 via ORAL
  Administered 2014-10-02: 1 via ORAL
  Administered 2014-10-02 – 2014-10-04 (×12): 2 via ORAL
  Filled 2014-10-01: qty 1
  Filled 2014-10-01 (×10): qty 2
  Filled 2014-10-01: qty 1
  Filled 2014-10-01 (×3): qty 2
  Filled 2014-10-01: qty 1
  Filled 2014-10-01: qty 2

## 2014-10-01 MED ORDER — KETOROLAC TROMETHAMINE 15 MG/ML IJ SOLN
INTRAMUSCULAR | Status: AC
  Filled 2014-10-01: qty 1

## 2014-10-01 MED ORDER — DEXTROSE-NACL 5-0.45 % IV SOLN
INTRAVENOUS | Status: DC
  Administered 2014-10-01: 1000 mL via INTRAVENOUS

## 2014-10-01 MED ORDER — HYDROCODONE-ACETAMINOPHEN 5-325 MG PO TABS: 1.0000 | ORAL_TABLET | Freq: Four times a day (QID) | ORAL | Status: DC | PRN

## 2014-10-01 MED ORDER — MIDAZOLAM HCL 5 MG/5ML IJ SOLN
INTRAMUSCULAR | Status: DC | PRN
  Administered 2014-10-01 (×2): 1 mg via INTRAVENOUS

## 2014-10-01 MED ORDER — HYDROMORPHONE HCL 1 MG/ML IJ SOLN
INTRAMUSCULAR | Status: AC
  Filled 2014-10-01: qty 1

## 2014-10-01 MED ORDER — PROPOFOL 10 MG/ML IV BOLUS
INTRAVENOUS | Status: DC | PRN
  Administered 2014-10-01: 160 mg via INTRAVENOUS

## 2014-10-01 MED ORDER — HYDROMORPHONE HCL 1 MG/ML IJ SOLN
INTRAMUSCULAR | Status: AC
  Administered 2014-10-01: 0.5 mg via INTRAVENOUS
  Filled 2014-10-01: qty 1

## 2014-10-01 MED ORDER — SUCCINYLCHOLINE CHLORIDE 20 MG/ML IJ SOLN
INTRAMUSCULAR | Status: DC | PRN
  Administered 2014-10-01: 120 mg via INTRAVENOUS

## 2014-10-01 MED ORDER — ACETAMINOPHEN 325 MG PO TABS: 650.0000 mg | ORAL_TABLET | ORAL | Status: DC | PRN

## 2014-10-01 MED ORDER — PROPOFOL 10 MG/ML IV BOLUS
INTRAVENOUS | Status: AC
  Filled 2014-10-01: qty 20

## 2014-10-01 MED ORDER — NEOSTIGMINE METHYLSULFATE 10 MG/10ML IV SOLN
INTRAVENOUS | Status: AC
  Filled 2014-10-01: qty 1

## 2014-10-01 MED ORDER — NEOSTIGMINE METHYLSULFATE 10 MG/10ML IV SOLN
INTRAVENOUS | Status: DC | PRN
  Administered 2014-10-01: 4 mg via INTRAVENOUS

## 2014-10-01 MED ORDER — HYDROMORPHONE HCL 1 MG/ML IJ SOLN
0.2500 mg | INTRAMUSCULAR | Status: DC | PRN
  Administered 2014-10-01 (×4): 0.5 mg via INTRAVENOUS

## 2014-10-01 MED ORDER — KETOROLAC TROMETHAMINE 15 MG/ML IJ SOLN
15.0000 mg | Freq: Four times a day (QID) | INTRAMUSCULAR | Status: DC
  Administered 2014-10-01 – 2014-10-02 (×3): 15 mg via INTRAVENOUS
  Filled 2014-10-01 (×5): qty 1

## 2014-10-01 MED ORDER — SODIUM CHLORIDE 0.9 % IV BOLUS (SEPSIS)
1000.0000 mL | Freq: Once | INTRAVENOUS | Status: AC
  Administered 2014-10-01: 1000 mL via INTRAVENOUS

## 2014-10-01 MED ORDER — GLYCOPYRROLATE 0.2 MG/ML IJ SOLN
INTRAMUSCULAR | Status: AC
  Filled 2014-10-01: qty 4

## 2014-10-01 MED ORDER — PROMETHAZINE HCL 25 MG/ML IJ SOLN
6.2500 mg | INTRAMUSCULAR | Status: DC | PRN
  Administered 2014-10-01: 6.25 mg via INTRAVENOUS

## 2014-10-01 SURGICAL SUPPLY — 89 items
BENZOIN TINCTURE PRP APPL 2/3 (GAUZE/BANDAGES/DRESSINGS) ×4 IMPLANT
BLADE HEX COATED 2.75 (ELECTRODE) ×4 IMPLANT
BLADE SURG 15 STRL LF DISP TIS (BLADE) IMPLANT
BLADE SURG 15 STRL SS (BLADE)
BLADE SURG SZ10 CARB STEEL (BLADE) IMPLANT
CABLE HIGH FREQUENCY MONO STRZ (ELECTRODE) ×4 IMPLANT
CANISTER SUCT 3000ML (MISCELLANEOUS) IMPLANT
CATH FOLEY 2WAY SLVR 18FR 30CC (CATHETERS) ×4 IMPLANT
CATH ROBINSON RED A/P 16FR (CATHETERS) ×4 IMPLANT
CATH TIEMANN FOLEY 18FR 5CC (CATHETERS) ×4 IMPLANT
CHLORAPREP W/TINT 26ML (MISCELLANEOUS) ×4 IMPLANT
CLIP LIGATING HEM O LOK PURPLE (MISCELLANEOUS) ×4 IMPLANT
CLOSURE WOUND 1/2 X4 (GAUZE/BANDAGES/DRESSINGS) ×1
CLOTH BEACON ORANGE TIMEOUT ST (SAFETY) ×4 IMPLANT
COVER SURGICAL LIGHT HANDLE (MISCELLANEOUS) ×4 IMPLANT
COVER TIP SHEARS 8 DVNC (MISCELLANEOUS) ×2 IMPLANT
COVER TIP SHEARS 8MM DA VINCI (MISCELLANEOUS) ×2
CUTTER ECHEON FLEX ENDO 45 340 (ENDOMECHANICALS) ×4 IMPLANT
DECANTER SPIKE VIAL GLASS SM (MISCELLANEOUS) IMPLANT
DEVICE RELIATACK FIXATION (MISCELLANEOUS) IMPLANT
DEVICE SECURE STRAP 25 ABSORB (INSTRUMENTS) ×8 IMPLANT
DEVICE TROCAR PUNCTURE CLOSURE (ENDOMECHANICALS) ×4 IMPLANT
DRAPE CAMERA CLOSED 9X96 (DRAPES) ×4 IMPLANT
DRAPE LAPAROSCOPIC ABDOMINAL (DRAPES) IMPLANT
DRAPE SURG IRRIG POUCH 19X23 (DRAPES) ×4 IMPLANT
DRSG TEGADERM 2-3/8X2-3/4 SM (GAUZE/BANDAGES/DRESSINGS) ×16 IMPLANT
DRSG TEGADERM 4X4.75 (GAUZE/BANDAGES/DRESSINGS) ×8 IMPLANT
DRSG TEGADERM 6X8 (GAUZE/BANDAGES/DRESSINGS) ×8 IMPLANT
ELECT REM PT RETURN 9FT ADLT (ELECTROSURGICAL) ×4
ELECTRODE REM PT RTRN 9FT ADLT (ELECTROSURGICAL) ×2 IMPLANT
GAUZE SPONGE 2X2 8PLY STRL LF (GAUZE/BANDAGES/DRESSINGS) ×2 IMPLANT
GAUZE SPONGE 4X4 12PLY STRL (GAUZE/BANDAGES/DRESSINGS) IMPLANT
GLOVE BIO SURGEON STRL SZ 6.5 (GLOVE) ×3 IMPLANT
GLOVE BIO SURGEON STRL SZ7.5 (GLOVE) IMPLANT
GLOVE BIO SURGEONS STRL SZ 6.5 (GLOVE) ×1
GLOVE BIOGEL PI IND STRL 7.0 (GLOVE) IMPLANT
GLOVE BIOGEL PI INDICATOR 7.0 (GLOVE)
GLOVE SURG SS PI 8.0 STRL IVOR (GLOVE) ×8 IMPLANT
GOWN STRL REUS W/ TWL XL LVL3 (GOWN DISPOSABLE) IMPLANT
GOWN STRL REUS W/TWL LRG LVL3 (GOWN DISPOSABLE) ×4 IMPLANT
GOWN STRL REUS W/TWL XL LVL3 (GOWN DISPOSABLE) ×8 IMPLANT
HOLDER FOLEY CATH W/STRAP (MISCELLANEOUS) ×4 IMPLANT
IV LACTATED RINGERS 1000ML (IV SOLUTION) IMPLANT
KIT ACCESSORY DA VINCI DISP (KITS) ×2
KIT ACCESSORY DVNC DISP (KITS) ×2 IMPLANT
KIT BASIN OR (CUSTOM PROCEDURE TRAY) IMPLANT
LIQUID BAND (GAUZE/BANDAGES/DRESSINGS) IMPLANT
MANIFOLD NEPTUNE II (INSTRUMENTS) ×4 IMPLANT
MESH VENTRALIGHT ST 6IN CRC (Mesh General) ×4 IMPLANT
NDL SAFETY ECLIPSE 18X1.5 (NEEDLE) ×2 IMPLANT
NEEDLE HYPO 18GX1.5 SHARP (NEEDLE) ×2
NEEDLE HYPO 22GX1.5 SAFETY (NEEDLE) IMPLANT
NEEDLE HYPO 25X1 1.5 SAFETY (NEEDLE) IMPLANT
NS IRRIG 1000ML POUR BTL (IV SOLUTION) IMPLANT
PACK BASIC VI WITH GOWN DISP (CUSTOM PROCEDURE TRAY) IMPLANT
PACK ROBOT UROLOGY CUSTOM (CUSTOM PROCEDURE TRAY) ×4 IMPLANT
PENCIL BUTTON HOLSTER BLD 10FT (ELECTRODE) IMPLANT
RELOAD GREEN ECHELON 45 (STAPLE) ×4 IMPLANT
SEALER TISSUE G2 CVD JAW 45CM (ENDOMECHANICALS) ×4 IMPLANT
SET TUBE IRRIG SUCTION NO TIP (IRRIGATION / IRRIGATOR) ×4 IMPLANT
SOL PREP POV-IOD 4OZ 10% (MISCELLANEOUS) IMPLANT
SOLUTION ELECTROLUBE (MISCELLANEOUS) ×4 IMPLANT
SPONGE GAUZE 2X2 STER 10/PKG (GAUZE/BANDAGES/DRESSINGS) ×2
SPONGE LAP 18X18 X RAY DECT (DISPOSABLE) IMPLANT
SPONGE LAP 4X18 X RAY DECT (DISPOSABLE) ×4 IMPLANT
STRIP CLOSURE SKIN 1/2X4 (GAUZE/BANDAGES/DRESSINGS) ×3 IMPLANT
SUT CHROMIC 2 0 SH (SUTURE) ×4 IMPLANT
SUT ETHIBOND 0 MO6 C/R (SUTURE) ×4 IMPLANT
SUT ETHILON 3 0 PS 1 (SUTURE) ×4 IMPLANT
SUT MNCRL AB 3-0 PS2 18 (SUTURE) ×4 IMPLANT
SUT MNCRL AB 4-0 PS2 18 (SUTURE) ×8 IMPLANT
SUT NOVA NAB DX-16 0-1 5-0 T12 (SUTURE) IMPLANT
SUT PROLENE 0 CT 1 30 (SUTURE) IMPLANT
SUT PROLENE 0 CT 1 CR/8 (SUTURE) IMPLANT
SUT PROLENE 2 0 KS (SUTURE) ×8 IMPLANT
SUT VIC AB 0 CT1 36 (SUTURE) ×4 IMPLANT
SUT VIC AB 2-0 SH 27 (SUTURE) ×2
SUT VIC AB 2-0 SH 27X BRD (SUTURE) ×2 IMPLANT
SUT VICRYL 0 UR6 27IN ABS (SUTURE) ×8 IMPLANT
SUT VLOC BARB 180 ABS3/0GR12 (SUTURE) ×8
SUTURE VLOC BRB 180 ABS3/0GR12 (SUTURE) ×4 IMPLANT
SYR 27GX1/2 1ML LL SAFETY (SYRINGE) ×4 IMPLANT
SYR BULB IRRIGATION 50ML (SYRINGE) IMPLANT
SYR CONTROL 10ML LL (SYRINGE) IMPLANT
TAPE CLOTH SURG 4X10 WHT LF (GAUZE/BANDAGES/DRESSINGS) ×4 IMPLANT
TOWEL OR 17X26 10 PK STRL BLUE (TOWEL DISPOSABLE) IMPLANT
TOWEL OR NON WOVEN STRL DISP B (DISPOSABLE) ×8 IMPLANT
WATER STERILE IRR 1500ML POUR (IV SOLUTION) IMPLANT
YANKAUER SUCT BULB TIP 10FT TU (MISCELLANEOUS) IMPLANT

## 2014-10-01 NOTE — Anesthesia Postprocedure Evaluation (Signed)
  Anesthesia Post-op Note  Patient: Dustin [name]  Procedure(s) Performed: Procedure(s) (LRB): ROBOTIC ASSISTED LAPAROSCOPIC RADICAL PROSTATECTOMY (N/A) LYMPH NODE DISSECTION (Bilateral) HERNIA REPAIR UMBILICAL ADULT (N/A)  Patient Location: PACU  Anesthesia Type: General  Level of Consciousness: awake and alert   Airway and Oxygen Therapy: Patient Spontanous Breathing  Post-op Pain: mild  Post-op Assessment: Post-op Vital signs reviewed, Patient's Cardiovascular Status Stable, Respiratory Function Stable, Patent Airway and No signs of Nausea or vomiting  Last Vitals:  Filed Vitals:   10/01/14 1841  BP: 140/76  Pulse: 79  Temp: 36.9 C  Resp: 14    Post-op Vital Signs: stable   Complications: No apparent anesthesia complications

## 2014-10-01 NOTE — Op Note (Signed)
Preoperative diagnosis: Clinically localized adenocarcinoma of the prostate (clinical stage T2a Gleason 7)  Postoperative diagnosis: Clinically localized adenocarcinoma of the prostate (clinical stage T2a Gleason 7)  Procedure:  1. Robotic assisted laparoscopic radical prostatectomy bilateral nerve sparing 2. Bilateral robotic assisted laparoscopic pelvic lymphadenectomy  Surgeon: Irine Seal M.D.  Assistant: Debbrah Alar PA  Anesthesia: General  Complications: None  EBL: 100 mL   Specimens: 1. Prostate and seminal vesicles 2. Bilateral pelvic lymph nodes  Disposition of specimens: Pathology  Drains: 1. 20 Fr coude catheter 2. # 19 Blake pelvic drain  Indication: Dustin [name] is a 63 y.o. year old patient with clinically localized prostate cancer T2a Gleason 7.  After a thorough review of the management options for treatment of prostate cancer, he elected to proceed with surgical therapy and the above procedure(s).  We have discussed the potential benefits and risks of the procedure, side effects of the proposed treatment, the likelihood of the patient achieving the goals of the procedure, and any potential problems that might occur during the procedure or recuperation. Informed consent has been obtained.  Description of procedure:  The patient was taken to the operating room and a general anesthetic was administered. He was given preoperative antibiotics, placed in the dorsal lithotomy position.  PAS hose were placed and a red rubber rectal catheter was placed, secured with tape and attached to an asepto syringe.  He was then prepped with chloroprep on the abdomen and betadine on the genitalia and then draped in the usual sterile fashion . Next a preoperative timeout was performed.   A urethral catheter was placed into the bladder and a site was selected near the umbilicus for placement of the camera port. This was placed using a standard open Hassan technique which allowed entry  into the peritoneal cavity under direct vision and without difficulty. A 12 mm port was placed and a pneumoperitoneum established. The camera was then used to inspect the abdomen and there was no evidence of any intra-abdominal injuries or other abnormalities. The remaining abdominal ports were then placed. 8 mm robotic ports were placed in the right lower quadrant, left lower quadrant, and far left lateral abdominal wall. A 5 mm port was placed in the right upper quadrant and a 12 mm port was placed in the right lateral abdominal wall for laparoscopic assistance. All ports were placed under direct vision without difficulty. The surgical cart was then docked.   Utilizing the cautery scissors, the bladder was reflected posteriorly allowing entry into the space of Retzius and identification of the endopelvic fascia and prostate. The periprostatic fat was then removed from the prostate allowing full exposure of the endopelvic fascia. The endopelvic fascia was then incised from the apex back to the base of the prostate bilaterally and the underlying levator muscle fibers were swept laterally off the prostate thereby isolating the dorsal venous complex.  There was some fibrosis at the right apex laterally that oozed and was managed with surgicel.  The dorsal vein was then stapled and divided with a 45 mm Flex Echelon stapler. Attention then turned to the bladder neck which was divided anteriorly thereby allowing entry into the bladder and exposure of the urethral catheter. The catheter balloon was deflated and the catheter was brought into the operative field and used to retract the prostate anteriorly. The posterior bladder neck was then examined and was divided allowing further dissection between the bladder and prostate posteriorly until the vasa deferentia and seminal vessels were identified. The  vasa deferentia were isolated, divided, and lifted anteriorly. The seminal vesicles were dissected down to their tips  with care to control the seminal vascular arterial blood supply. These structures were then lifted anteriorly and the space between Denonvillier's fascia and the anterior rectum was developed with a combination of sharp and blunt dissection. This isolated the vascular pedicles of the prostate.  The lateral prostatic fascia was then sharply incised allowing release of the neurovascular bundles bilaterally.  The right dissection was somewhat compromised by the fibrosis at the right apex.  The vascular pedicles of the prostate were then ligated with the Enseal  between the prostate and neurovascular bundles and divided with sharp cold scissor dissection resulting in partial bilateral neurovascular bundle preservation. The neurovascular bundles were then separated off the apex of the prostate and urethra bilaterally.  The urethra was then sharply transected allowing the prostate specimen to be disarticulated. The pelvis was copiously irrigated and hemostasis was ensured. There was no evidence for rectal injury.  Attention then turned to the right pelvic sidewall. The fibrofatty tissue between the external iliac vein, confluence of the iliac vessels, hypogastric artery, and Cooper's ligament was dissected free from the pelvic sidewall with care to preserve the obturator nerve. Weck clips were used for lymphostasis and hemostasis. An identical procedure was performed on the contralateral side and the lymphatic packets were removed for permanent pathologic analysis.  Attention then turned to the urethral anastomosis. A 2-0 Vicryl slip knot was placed between Denonvillier's fascia, the posterior bladder neck, and the posterior urethra to reapproximate these structures. A double-armed 3-0 V-lock suture was then used to perform a 360 running tension-free anastomosis between the bladder neck and urethra.   A new urethral catheter was then placed into the bladder and irrigated. There were no blood clots within the  bladder and the anastomosis appeared to be watertight. A #19 Blake drain was then brought through the left lateral 8 mm port site and positioned appropriately within the pelvis. It was secured to the skin with a nylon suture. The surgical cart was then undocked and the prostate was placed in an endopouch retrieval bag with the string brought through the video port.  The foley was placed to straight drainage and the drain to bulb suction.  The patient appeared to tolerate this portion of the procedure well and without complications.   After completion of the prostatectomy, Dr. Rosendo Gros came in to repair the patient's umbilical hernia.   He removed all of the ports and closed the right 98mm assistant's port as part of the hernia repair.   He will dictate his portion of the procedure separately.

## 2014-10-01 NOTE — Anesthesia Preprocedure Evaluation (Addendum)
Anesthesia Evaluation  Patient identified by MRN, date of birth, ID band Patient awake    Reviewed: Allergy & Precautions, H&P , NPO status , Patient's Chart, lab work & pertinent test results  Airway Mallampati: II  TM Distance: >3 FB Neck ROM: Full    Dental  (+) Edentulous Upper, Edentulous Lower   Pulmonary Current Smoker,  breath sounds clear to auscultation  Pulmonary exam normal       Cardiovascular Exercise Tolerance: Good hypertension, Pt. on medications Rhythm:Regular Rate:Normal  ECG and CXR reviewed. Good exercise tolerance. No cardiopulmonary symptoms.   Neuro/Psych negative neurological ROS  negative psych ROS   GI/Hepatic negative GI ROS, Neg liver ROS,   Endo/Other  negative endocrine ROS  Renal/GU negative Renal ROS  negative genitourinary   Musculoskeletal  (+) Arthritis -,   Abdominal (+) + obese,   Peds negative pediatric ROS (+)  Hematology negative hematology ROS (+)   Anesthesia Other Findings   Reproductive/Obstetrics negative OB ROS                           Anesthesia Physical Anesthesia Plan  ASA: II  Anesthesia Plan: General   Post-op Pain Management:    Induction: Intravenous  Airway Management Planned: Oral ETT  Additional Equipment:   Intra-op Plan:   Post-operative Plan: Extubation in OR  Informed Consent: I have reviewed the patients History and Physical, chart, labs and discussed the procedure including the risks, benefits and alternatives for the proposed anesthesia with the patient or authorized representative who has indicated his/her understanding and acceptance.   Dental advisory given  Plan Discussed with: CRNA  Anesthesia Plan Comments:         Anesthesia Quick Evaluation

## 2014-10-01 NOTE — Op Note (Signed)
10/01/2014  4:58 PM  PATIENT:  Dustin [name]  63 y.o. male  PRE-OPERATIVE DIAGNOSIS:  UMBILICAL HERNIA  POST-OPERATIVE DIAGNOSIS:  SAME   PROCEDURE:  Procedure(s): LAP ASSISTED HERNIA REPAIR UMBILICAL ADULT WITH MESH (N/A)  SURGEON:  Surgeon(s) and Role:    * Ralene Ok, MD - Primary  ANESTHESIA:   local and general  EBL:  Total I/O In: 2000 [I.V.:2000] Out: -   BLOOD ADMINISTERED:none  DRAINS: none   LOCAL MEDICATIONS USED:  BUPIVICAINE   SPECIMEN:  No Specimen  DISPOSITION OF SPECIMEN:  N/A  COUNTS:  YES  TOURNIQUET:  * No tourniquets in log *  DICTATION: .Dragon Dictation Details of the procedure:   I began my procedure after Dr. Roni Bread has finished his prostatectomy, robotically. He will dictate this under separate cover.  The ports that were used with his robotic prostatectomy were used for this portion of the case and is umbilical hernia repair.  Upon laparoscopically visualizing the hernia was approximately 3 cm hernia at the umbilicus just inferior to the 10 mm port site.  The umbilical port site was extended inferiorly into the umbilical hernia. The specimen was extracted. At this time the fascia was debrided of the hernia sac. A piece of ventral light 6 inch mesh was in placed into the abdomen. 0 Ethibond stitches were placed in interrupted fashion to reapproximate the fascia at the umbilical hernia site.     A Bard Ventralight 15.4cm  mesh was inserted into the abdomen.  The mesh was secured circumferentially with am Securestrap tacker in a double crown fashion. 2 Prolenes were used at the 12:00 3:00 6:00 and 9:00 positions as a transvesical suture. The mesh lay flat. The omentum was brought over the area of the mesh. The pneumoperitoneum was evacuated  & all trocars  were removed. The skin was reapproximated with 4-0  Monocryl sutures in a subcuticular fashion. The skin was dressed with Steri-Strips tape and gauze.  The patient was taken to the recovery room  in stable condition.   PLAN OF CARE: Admit to inpatient   PATIENT DISPOSITION:  PACU - hemodynamically stable.   Delay start of Pharmacological VTE agent (>24hrs) due to surgical blood loss or risk of bleeding: not applicable

## 2014-10-01 NOTE — Anesthesia Procedure Notes (Signed)
Procedure Name: Intubation Date/Time: 10/01/2014 1:55 PM Performed by: Ofilia Neas Pre-anesthesia Checklist: Patient identified, Emergency Drugs available, Suction available, Patient being monitored and Timeout performed Patient Re-evaluated:Patient Re-evaluated prior to inductionOxygen Delivery Method: Circle system utilized Preoxygenation: Pre-oxygenation with 100% oxygen Intubation Type: IV induction Ventilation: Two handed mask ventilation required Laryngoscope Size: Mac and 4 Grade View: Grade III Tube type: Oral Tube size: 7.5 mm Number of attempts: 1 Airway Equipment and Method: Stylet Placement Confirmation: positive ETCO2,  CO2 detector and breath sounds checked- equal and bilateral Secured at: 21 cm Tube secured with: Tape Dental Injury: Teeth and Oropharynx as per pre-operative assessment  Difficulty Due To: Difficulty was anticipated, Difficult Airway- due to large tongue, Difficult Airway- due to reduced neck mobility, Difficult Airway- due to limited oral opening, Difficult Airway- due to anterior larynx and Difficult Airway- due to immobile epiglottis Future Recommendations: Recommend- induction with short-acting agent, and alternative techniques readily available Comments: Arytenoids visualized only with cricoid pressure/head lift

## 2014-10-01 NOTE — Transfer of Care (Signed)
Immediate Anesthesia Transfer of Care Note  Patient: Dustin [name]  Procedure(s) Performed: Procedure(s): ROBOTIC ASSISTED LAPAROSCOPIC RADICAL PROSTATECTOMY (N/A) LYMPH NODE DISSECTION (Bilateral) HERNIA REPAIR UMBILICAL ADULT (N/A)  Patient Location: PACU  Anesthesia Type:General  Level of Consciousness: awake and alert   Airway & Oxygen Therapy: Patient Spontanous Breathing and Patient connected to face mask oxygen  Post-op Assessment: Report given to PACU RN and Post -op Vital signs reviewed and stable  Post vital signs: Reviewed and stable  Complications: No apparent anesthesia complications

## 2014-10-01 NOTE — Interval H&P Note (Signed)
History and Physical Interval Note:  He has no new complaints or problems since his last visit.   10/01/2014 12:26 PM  Dustin [name]  has presented today for surgery, with the diagnosis of PROSTATE CANCER   The various methods of treatment have been discussed with the patient and family. After consideration of risks, benefits and other options for treatment, the patient has consented to  Procedure(s): ROBOTIC ASSISTED LAPAROSCOPIC RADICAL PROSTATECTOMY (N/A) LYMPH NODE DISSECTION (N/A) HERNIA REPAIR UMBILICAL ADULT (N/A) as a surgical intervention .  The patient's history has been reviewed, patient examined, no change in status, stable for surgery.  I have reviewed the patient's chart and labs.  Questions were answered to the patient's satisfaction.     Leinaala Catanese J

## 2014-10-02 ENCOUNTER — Encounter (HOSPITAL_COMMUNITY): Payer: Self-pay | Admitting: Urology

## 2014-10-02 LAB — BASIC METABOLIC PANEL
ANION GAP: 9 (ref 5–15)
BUN: 8 mg/dL (ref 6–23)
CO2: 26 mEq/L (ref 19–32)
CREATININE: 0.81 mg/dL (ref 0.50–1.35)
Calcium: 9 mg/dL (ref 8.4–10.5)
Chloride: 97 mEq/L (ref 96–112)
GFR calc non Af Amer: >90 mL/min (ref >90)
Glucose, Bld: 144 mg/dL — ABNORMAL HIGH (ref 70–99)
Potassium: 4.6 mEq/L (ref 3.7–5.3)
Sodium: 132 mEq/L — ABNORMAL LOW (ref 137–147)

## 2014-10-02 LAB — CREATININE, FLUID (PLEURAL, PERITONEAL, JP DRAINAGE): CREAT FL: 5.7 mg/dL

## 2014-10-02 LAB — HEMOGLOBIN AND HEMATOCRIT, BLOOD
HCT: 42.9 % (ref 39.0–52.0)
Hemoglobin: 14.9 g/dL (ref 13.0–17.0)

## 2014-10-02 MED ORDER — BISACODYL 10 MG RE SUPP
10.0000 mg | Freq: Once | RECTAL | Status: AC
  Administered 2014-10-02: 10 mg via RECTAL
  Filled 2014-10-02: qty 1

## 2014-10-02 NOTE — Progress Notes (Signed)
CARE MANAGEMENT NOTE 10/02/2014  Patient:  Dustin [name], Dustin [name]   Account Number:  [id number]  Date Initiated:  10/02/2014  Documentation initiated by:  Dessa Phi  Subjective/Objective Assessment:   63 y/o m admitted w/prostate ca, & umbilical hernia.     Action/Plan:   From home.   Anticipated DC Date:  10/03/2014   Anticipated DC Plan:  [place]  CM consult      Choice offered to / List presented to:             Status of service:  In process, will continue to follow Medicare Important Message given?   (If response is "NO", the following Medicare IM given date fields will be blank) Date Medicare IM given:   Medicare IM given by:   Date Additional Medicare IM given:   Additional Medicare IM given by:    Discharge Disposition:    Per UR Regulation:  Reviewed for med. necessity/level of care/duration of stay  If discussed at [place] of Stay Meetings, dates discussed:    Comments:  10/02/14 Dessa Phi RN BSN NCM [phone] pod#1 prostatectomy/lap hernia repair. Elevated creat, jp,f/c.No anticipated d/c needs.

## 2014-10-02 NOTE — Progress Notes (Signed)
Patient ID: Dustin [name], male   DOB: 08/27/1951, 63 y.o.   MRN: [id number] 1 Day Post-Op  Subjective: Dustin [name] is doing well with only moderate postoperative discomfort.   He has no nausea.  ROS:  Review of Systems  Constitutional: Negative for fever.    Anti-infectives: Anti-infectives    Start     Dose/Rate Route Frequency Ordered Stop   10/01/14 1000  ceFAZolin (ANCEF) IVPB 2 g/50 mL premix     2 g100 mL/hr over 30 Minutes Intravenous On call to O.R. 10/01/14 0940 10/01/14 1637   10/01/14 0940  ceFAZolin (ANCEF) IVPB 2 g/50 mL premix  Status:  Discontinued     2 g100 mL/hr over 30 Minutes Intravenous 30 min pre-op 10/01/14 0940 10/01/14 0945   10/01/14 0000  ciprofloxacin (CIPRO) 500 MG tablet     500 mg Oral 2 times daily 10/01/14 1039        Current Facility-Administered Medications  Medication Dose Route Frequency Provider Last Rate Last Dose  . acetaminophen (OFIRMEV) IV 1,000 mg  1,000 mg Intravenous Once Malka So, MD      . acetaminophen (TYLENOL) tablet 650 mg  650 mg Oral Q4H PRN Debbrah Alar, PA-C      . dextrose 5 %-0.45 % sodium chloride infusion   Intravenous Continuous Debbrah Alar, PA-C 125 mL/hr at 10/01/14 1753 1,000 mL at 10/01/14 1753  . losartan (COZAAR) tablet 50 mg  50 mg Oral Daily Malka So, MD       And  . hydrochlorothiazide (MICROZIDE) capsule 12.5 mg  12.5 mg Oral Daily Malka So, MD      . HYDROmorphone (DILAUDID) injection 0.5-1 mg  0.5-1 mg Intravenous Q2H PRN Debbrah Alar, PA-C      . ketorolac (TORADOL) 15 MG/ML injection 15 mg  15 mg Intravenous 4 times per day Debbrah Alar, PA-C   15 mg at 10/02/14 9373  . oxyCODONE-acetaminophen (PERCOCET/ROXICET) 5-325 MG per tablet 1-2 tablet  1-2 tablet Oral Q4H PRN Debbrah Alar, PA-C   1 tablet at 10/02/14 0548     Objective: Vital signs in last 24 hours: Temp:  [97.2 F (36.2 C)-98.5 F (36.9 C)] 97.2 F (36.2 C) (12/17 0523) Pulse Rate:  [54-81] 54 (12/17 0523) Resp:  [12-20] 18 (12/17  0523) BP: (105-164)/(66-88) 126/81 mmHg (12/17 0523) SpO2:  [94 %-99 %] 99 % (12/17 0523) Weight:  [101.152 kg (223 lb)] 101.152 kg (223 lb) (12/16 0956)  Intake/Output from previous day: 12/16 0701 - 12/17 0700 In: 4340 [P.O.:840; I.V.:3500] Out: 81 [Urine:3200; Drains:230; Blood:50] Intake/Output this shift:     Physical Exam  Constitutional: He is well-developed, well-nourished, and in no distress.  Cardiovascular: Normal rate and regular rhythm.   Pulmonary/Chest: Effort normal and breath sounds normal.  Abdominal: Soft.  Mild distention.   BS are quiet.   Dressings intact.   Drain output is serosanguinous.   Genitourinary:  Foley draining clear urine.     Lab Results:   Recent Labs  10/01/14 1727  HGB 14.2  HCT 42.1   BMET  Recent Labs  10/02/14 0353  NA 132*  K 4.6  CL 97  CO2 26  GLUCOSE 144*  BUN 8  CREATININE 0.81  CALCIUM 9.0   PT/INR No results for input(s): LABPROT, INR in the last 72 hours. ABG No results for input(s): PHART, HCO3 in the last 72 hours.  Invalid input(s): PCO2, PO2  Studies/Results: No results found.   Assessment: s/p Procedure(s): ROBOTIC ASSISTED LAPAROSCOPIC  RADICAL PROSTATECTOMY LYMPH NODE DISSECTION HERNIA REPAIR UMBILICAL ADULT  He is doing well post op, but he still has significant JP drainage.  Plan: I have ordered a JP Creatinine and if it is just serum level, the drain can be removed.  He should be ok for discharge later today.      [name]: 1 day    Lastacia Solum J 10/02/2014

## 2014-10-02 NOTE — Progress Notes (Signed)
1 Day Post-Op  Subjective: Pt doing well aside from abd soreness  Objective: Vital signs in last 24 hours: Temp:  [97.2 F (36.2 C)-98.5 F (36.9 C)] 97.2 F (36.2 C) (12/17 0523) Pulse Rate:  [54-81] 54 (12/17 0523) Resp:  [12-20] 18 (12/17 0523) BP: (105-164)/(66-88) 126/81 mmHg (12/17 0523) SpO2:  [94 %-99 %] 99 % (12/17 0523) Weight:  [223 lb (101.152 kg)] 223 lb (101.152 kg) (12/16 0956)    Intake/Output from previous day: 12/16 0701 - 12/17 0700 In: 4340 [P.O.:840; I.V.:3500] Out: 3480 [Urine:3200; Drains:230; Blood:50] Intake/Output this shift:    General appearance: alert and cooperative GI: soft, approp ttp, wound dressed, clean  Lab Results:   Recent Labs  10/01/14 1727  HGB 14.2  HCT 42.1   BMET  Recent Labs  10/02/14 0353  NA 132*  K 4.6  CL 97  CO2 26  GLUCOSE 144*  BUN 8  CREATININE 0.81  CALCIUM 9.0    Anti-infectives: Anti-infectives    Start     Dose/Rate Route Frequency Ordered Stop   10/01/14 1000  ceFAZolin (ANCEF) IVPB 2 g/50 mL premix     2 g100 mL/hr over 30 Minutes Intravenous On call to O.R. 10/01/14 0940 10/01/14 1637   10/01/14 0940  ceFAZolin (ANCEF) IVPB 2 g/50 mL premix  Status:  Discontinued     2 g100 mL/hr over 30 Minutes Intravenous 30 min pre-op 10/01/14 0940 10/01/14 0945   10/01/14 0000  ciprofloxacin (CIPRO) 500 MG tablet     500 mg Oral 2 times daily 10/01/14 1039        Assessment/Plan: s/p Procedure(s): ROBOTIC ASSISTED LAPAROSCOPIC RADICAL PROSTATECTOMY (N/A) LYMPH NODE DISSECTION (Bilateral) HERNIA REPAIR UMBILICAL ADULT (N/A) Mobilize Ok to [employer] as Dr. Jeffie Pollock sees fit Has f/u appt with me shceduled    LOS: 1 day    Rosario Jacks., [employer] [facility] 10/02/2014

## 2014-10-02 NOTE — Discharge Instructions (Signed)
1. Activity:  You are encouraged to ambulate frequently (about every hour during waking hours) to help prevent blood clots from forming in your legs or lungs.  However, you should not engage in any heavy lifting (> 10-15 lbs), strenuous activity, or straining. 2. Diet: You should continue a clear liquid diet until passing gas from below.  Once this occurs, you may advance your diet to a soft diet that would be easy to digest (i.e soups, scrambled eggs, mashed potatoes, etc.) for 24 hours just as you would if getting over a bad stomach flu.  If tolerating this diet well for 24 hours, you may then begin eating regular food.  It will be normal to have some amount of bloating, nausea, and abdominal discomfort intermittently. 3. Prescriptions:  You will be provided a prescription for pain medication to take as needed.  If your pain is not severe enough to require the prescription pain medication, you may take Tylenol instead.  You should also take an over the counter stool softener (Colace 100 mg twice daily) to avoid straining with bowel movements as the pain medication may constipate you. Finally, you will also be provided a prescription for an antibiotic to begin the day prior to your return visit in the office for catheter removal. 4. Catheter care: You will be taught how to take care of the catheter by the nursing staff prior to discharge from the hospital.  You may use both a leg bag and the larger bedside bag but it is recommended to at least use the bigger bedside bag at nighttime as the leg bag is small and will fill up overnight and also does not drain as well when lying flat. You may periodically feel a strong urge to void with the catheter in place.  This is a bladder spasm and most often can occur when having a bowel movement or when you are moving around. It is typically self-limited and usually will stop after a few minutes.  You may use some Vaseline or Neosporin around the tip of the catheter to  reduce friction at the tip of the penis. 5. Incisions: You may remove your dressing bandages the 2nd day after surgery.  You most likely will have a few small staples in each of the incisions and once the bandages are removed, the incisions may stay open to air.  You may start showering (not soaking or bathing in water) 48 hours after surgery and the incisions simply need to be patted dry after the shower.  No additional care is needed. 6. What to call us about: You should call the office [phone]) if you develop fever > 101, persistent vomiting, or the catheter stops draining. Also, feel free to call with any other questions you may have and remember the handout that was provided to you as a reference preoperatively which answers many of the common questions that arise after surgery. 7. You may resume aspirin, advil, aleve, vitamins, and supplements 7 days after surgery.       [facility] _______[facility]  UMBILICAL  HERNIA REPAIR: POST OP INSTRUCTIONS  Always review your discharge instruction sheet given to you by the facility where your surgery was performed. IF YOU HAVE DISABILITY OR FAMILY LEAVE FORMS, YOU MUST BRING THEM TO THE OFFICE FOR PROCESSING.   DO NOT GIVE THEM TO YOUR DOCTOR.  1. A  prescription for pain medication may be given to you upon discharge.  Take your pain medication as prescribed, if needed.  If narcotic pain medicine is not needed, then you may take acetaminophen (Tylenol) or ibuprofen (Advil) as needed. 2. Take your usually prescribed medications unless otherwise directed. 3. If you need a refill on your pain medication, please contact your pharmacy.  They will contact our office to request authorization. Prescriptions will not be filled after 5 pm or on week-ends. 4. You should follow a light diet the first 24 hours after arrival home, such as soup and crackers, etc.  Be sure to include lots of fluids daily.  Resume your normal diet the day after  surgery. 5. Most patients will experience some swelling and bruising around the umbilicus or in the groin and scrotum.  Ice packs and reclining will help.  Swelling and bruising can take several days to resolve.  6. It is common to experience some constipation if taking pain medication after surgery.  Increasing fluid intake and taking a stool softener (such as Colace) will usually help or prevent this problem from occurring.  A mild laxative (Milk of Magnesia or Miralax) should be taken according to package directions if there are no bowel movements after 48 hours. 7. Unless discharge instructions indicate otherwise, you may remove your bandages 24-48 hours after surgery, and you may shower at that time.  You may have steri-strips (small skin tapes) in place directly over the incision.  These strips should be left on the skin for 7-10 days.  If your surgeon used skin glue on the incision, you may shower in 24 hours.  The glue will flake off over the next 2-3 weeks.  Any sutures or staples will be removed at the office during your follow-up visit. 8. ACTIVITIES:  You may resume regular (light) daily activities beginning the next day--such as daily self-care, walking, climbing stairs--gradually increasing activities as tolerated.  You may have sexual intercourse when it is comfortable.  Refrain from any heavy lifting or straining until approved by your doctor. a. You may drive when you are no longer taking prescription pain medication, you can comfortably wear a seatbelt, and you can safely maneuver your car and apply brakes. b. RETURN TO WORK:  __________________________________________________________ 9. You should see your doctor in the office for a follow-up appointment approximately 2-3 weeks after your surgery.  Make sure that you call for this appointment within a day or two after you arrive home to insure a convenient appointment time. 10. OTHER INSTRUCTIONS:   __________________________________________________________________________________________________________________________________________________________________________________________  WHEN TO CALL YOUR DOCTOR: 1. Fever over 101.0 2. Inability to urinate 3. Nausea and/or vomiting 4. Extreme swelling or bruising 5. Continued bleeding from incision. 6. Increased pain, redness, or drainage from the incision  The clinic staff is available to answer your questions during regular business hours.  Please dont hesitate to call and ask to speak to one of the nurses for clinical concerns.  If you have a medical emergency, go to the nearest emergency room or call 911.  A surgeon from [facility] is always on call at the hospital   [address], [place], [place], [place]  [address] ?  [address], [place],    [address] [phone] ? [phone] ? FAX [phone] Web site: [email or web address]

## 2014-10-02 NOTE — Progress Notes (Signed)
JP Cr elevated with increase output this morning.  Will place foley to traction and leave JP in place.   Monitor pt overnight.    Pt informed and he agrees with plan.

## 2014-10-03 MED ORDER — CALCIUM CARBONATE ANTACID 500 MG PO CHEW
1.0000 | CHEWABLE_TABLET | Freq: Three times a day (TID) | ORAL | Status: DC | PRN
  Administered 2014-10-03: 200 mg via ORAL
  Filled 2014-10-03: qty 1

## 2014-10-03 MED ORDER — BISACODYL 10 MG RE SUPP
10.0000 mg | Freq: Once | RECTAL | Status: AC
  Administered 2014-10-03: 10 mg via RECTAL
  Filled 2014-10-03: qty 1

## 2014-10-03 MED ORDER — BISACODYL 10 MG RE SUPP: 10.0000 mg | Freq: Every day | RECTAL | Status: DC | PRN

## 2014-10-03 NOTE — Progress Notes (Signed)
2 Days Post-Op Subjective: Patient reports no flatus or bowel movement yet. He does feel a bit distended and has belched.  Objective: Vital signs in last 24 hours: Temp:  [97.5 F (36.4 C)-98.1 F (36.7 C)] 98.1 F (36.7 C) (12/18 0610) Pulse Rate:  [58-72] 72 (12/18 0610) Resp:  [18-20] 18 (12/18 0610) BP: (121-147)/(75-89) 124/75 mmHg (12/18 0610) SpO2:  [93 %-97 %] 93 % (12/18 0610)  Intake/Output from previous day: 12/17 0701 - 12/18 0700 In: 2640 [P.O.:2640] Out: 1308 [Urine:3850; Drains:295] Intake/Output this shift: Total I/O In: 960 [P.O.:960] Out: 2140 [Urine:2075; Drains:65]  Physical Exam:  Constitutional: Vital signs reviewed. WD WN in NAD   Eyes: PERRL, No scleral icterus.   Cardiovascular: RRR Pulmonary/Chest: Normal effort Abdominal: Soft. Incisions are clean, dry and intact. He is tympanitic but nontender. Genitourinary: Minimal penile edema. Extremities: No cyanosis or edema  Urine is clear. Lab Results:  Recent Labs  10/01/14 1727 10/02/14 0745  HGB 14.2 14.9  HCT 42.1 42.9   BMET  Recent Labs  10/02/14 0353  NA 132*  K 4.6  CL 97  CO2 26  GLUCOSE 144*  BUN 8  CREATININE 0.81  CALCIUM 9.0   No results for input(s): LABPT, INR in the last 72 hours. No results for input(s): LABURIN in the last 72 hours. No results found for this or any previous visit.  Studies/Results: No results found.  Assessment/Plan:   Postoperative day #2 robotic-assisted left scalp radical prostatectomy. There is a bit of a urine leak. This is controlled with his JP drain. He is a bit distended, and has not had a bowel movement or flatus yet.    I will give him another Dulcolax suppository. He will ambulate. If he has flatus, we will probably let him go later on today.   LOS: 2 days   Franchot Gallo M 10/03/2014, 6:20 AM

## 2014-10-04 NOTE — Progress Notes (Signed)
Surgery Dec 16 Bowels starting to wake up and tolerating small fluids this am Drain greater than 60 ml but thrown out and not measured Plan is to advance diet and send home with drain

## 2014-10-21 NOTE — Discharge Summary (Signed)
  Date of admission: 10/01/2014  Date of discharge: 10/04/14  Admission diagnosis: Prostate Cancer  Discharge diagnosis: Prostate Cancer  History and Physical: For full details, please see admission history and physical. Briefly, Dustin [name] is a 64 y.o. gentleman with localized prostate cancer.  After discussing management/treatment options, he elected to proceed with surgical treatment.  Hospital Course: Dustin [name] was taken to the operating room on 10/01/2014 and underwent a robotic assisted laparoscopic radical prostatectomy with subsequent umbilical hernia repair with mesh by Dr. Rosendo Gros. He tolerated the procedures well and without complications. Postoperatively, he was able to be transferred to a regular hospital room following recovery from anesthesia.  He was able to begin ambulating the night of surgery. He remained hemodynamically stable overnight.  He had good  urine output throughout the post op course.  He did have a significant amount of drainage from the JP, therefore, a JP Cr was checked.  This returned with an elevated value consistent with a urine leak.  His JP drain remained in place at the time of d/c.  He bowel function was slow to return and his diet was slowly advanced. By POD 3 the pt had return of bowel function, had been transitioned to oral pain medication, and had met all discharge criteria and was able to be discharged home.  He was given instructions on care of his JP and foley as well as instructions to document JP drain output.    Laboratory values:  CBC Latest Ref Rng 10/02/2014 10/01/2014 09/24/2014  WBC 4.0 - 10.5 K/uL - - 4.8  Hemoglobin 13.0 - 17.0 g/dL 14.9 14.2 14.3  Hematocrit 39.0 - 52.0 % 42.9 42.1 43.1  Platelets 150 - 400 K/uL - - 243   Lab Results  Component Value Date   CREATININE 0.81 10/02/2014    Disposition: Home  Discharge instruction: He was instructed to be ambulatory but to refrain from heavy lifting, strenuous activity, or driving.  He was instructed on JP, urethral and catheter care.  Discharge medications:     Medication List    STOP taking these medications        ibuprofen 200 MG tablet  Commonly known as:  ADVIL,MOTRIN     OMEGA 3 PO      TAKE these medications        acetaminophen 500 MG tablet  Commonly known as:  TYLENOL  Take 500 mg by mouth every 6 (six) hours as needed (Pain).     ciprofloxacin 500 MG tablet  Commonly known as:  CIPRO  Take 1 tablet (500 mg total) by mouth 2 (two) times daily. Start day prior to office visit for foley removal     HYDROcodone-acetaminophen 5-325 MG per tablet  Commonly known as:  NORCO  Take 1-2 tablets by mouth every 6 (six) hours as needed.     losartan-hydrochlorothiazide 50-12.5 MG per tablet  Commonly known as:  HYZAAR  Take 1 tablet by mouth every morning.     tetrahydrozoline 0.05 % ophthalmic solution  Place 1 drop into both eyes 3 (three) times daily as needed (Dry/red eyes).        Followup: He will followup in 1 week for catheter removal and to discuss his surgical pathology results.

## 2016-07-12 ENCOUNTER — Other Ambulatory Visit (HOSPITAL_COMMUNITY): Payer: Self-pay | Admitting: Internal Medicine

## 2016-07-12 DIAGNOSIS — F17200 Nicotine dependence, unspecified, uncomplicated: Secondary | ICD-10-CM

## 2016-07-21 ENCOUNTER — Encounter (HOSPITAL_COMMUNITY): Payer: Self-pay

## 2016-07-21 ENCOUNTER — Ambulatory Visit (HOSPITAL_COMMUNITY)
Admission: RE | Admit: 2016-07-21 | Discharge: 2016-07-21 | Disposition: A | Discharge status: Home / Self Care | Payer: BLUE CROSS/BLUE SHIELD | Source: Ambulatory Visit | Attending: Internal Medicine | Admitting: Internal Medicine

## 2016-07-21 DIAGNOSIS — F17200 Nicotine dependence, unspecified, uncomplicated: Secondary | ICD-10-CM

## 2017-11-10 DIAGNOSIS — Z6833 Body mass index (BMI) 33.0-33.9, adult: Secondary | ICD-10-CM | POA: Diagnosis not present

## 2017-11-10 DIAGNOSIS — F17200 Nicotine dependence, unspecified, uncomplicated: Secondary | ICD-10-CM | POA: Diagnosis not present

## 2017-11-10 DIAGNOSIS — I10 Essential (primary) hypertension: Secondary | ICD-10-CM | POA: Diagnosis not present

## 2018-07-30 DIAGNOSIS — C61 Malignant neoplasm of prostate: Secondary | ICD-10-CM | POA: Diagnosis not present

## 2018-08-08 DIAGNOSIS — C61 Malignant neoplasm of prostate: Secondary | ICD-10-CM | POA: Diagnosis not present

## 2018-08-08 DIAGNOSIS — N393 Stress incontinence (female) (male): Secondary | ICD-10-CM | POA: Diagnosis not present

## 2018-08-08 DIAGNOSIS — N5231 Erectile dysfunction following radical prostatectomy: Secondary | ICD-10-CM | POA: Diagnosis not present

## 2018-08-14 DIAGNOSIS — I10 Essential (primary) hypertension: Secondary | ICD-10-CM | POA: Diagnosis not present

## 2018-08-14 DIAGNOSIS — J410 Simple chronic bronchitis: Secondary | ICD-10-CM | POA: Diagnosis not present

## 2018-08-14 DIAGNOSIS — Z0000 Encounter for general adult medical examination without abnormal findings: Secondary | ICD-10-CM | POA: Diagnosis not present

## 2018-08-14 DIAGNOSIS — Z23 Encounter for immunization: Secondary | ICD-10-CM | POA: Diagnosis not present

## 2018-08-14 DIAGNOSIS — Z0001 Encounter for general adult medical examination with abnormal findings: Secondary | ICD-10-CM | POA: Diagnosis not present

## 2018-08-14 DIAGNOSIS — Z6834 Body mass index (BMI) 34.0-34.9, adult: Secondary | ICD-10-CM | POA: Diagnosis not present

## 2018-10-04 ENCOUNTER — Ambulatory Visit (HOSPITAL_COMMUNITY)
Admission: RE | Admit: 2018-10-04 | Discharge: 2018-10-04 | Disposition: A | Discharge status: Home / Self Care | Payer: BLUE CROSS/BLUE SHIELD | Source: Ambulatory Visit | Attending: Internal Medicine | Admitting: Internal Medicine

## 2018-10-04 ENCOUNTER — Other Ambulatory Visit (HOSPITAL_COMMUNITY): Payer: Self-pay | Admitting: Internal Medicine

## 2018-10-04 DIAGNOSIS — M25561 Pain in right knee: Secondary | ICD-10-CM | POA: Insufficient documentation

## 2018-10-04 DIAGNOSIS — I10 Essential (primary) hypertension: Secondary | ICD-10-CM | POA: Diagnosis not present

## 2018-10-04 IMAGING — DX DG KNEE COMPLETE 4+V*R*
4 series · 4 of 4 positions shown · non-contrast
Comparison: None.

CLINICAL DATA: Medial right knee pain for the past 2 weeks. No
known injury.

EXAM:
RIGHT KNEE - COMPLETE 4+ VIEW

[knee ap]
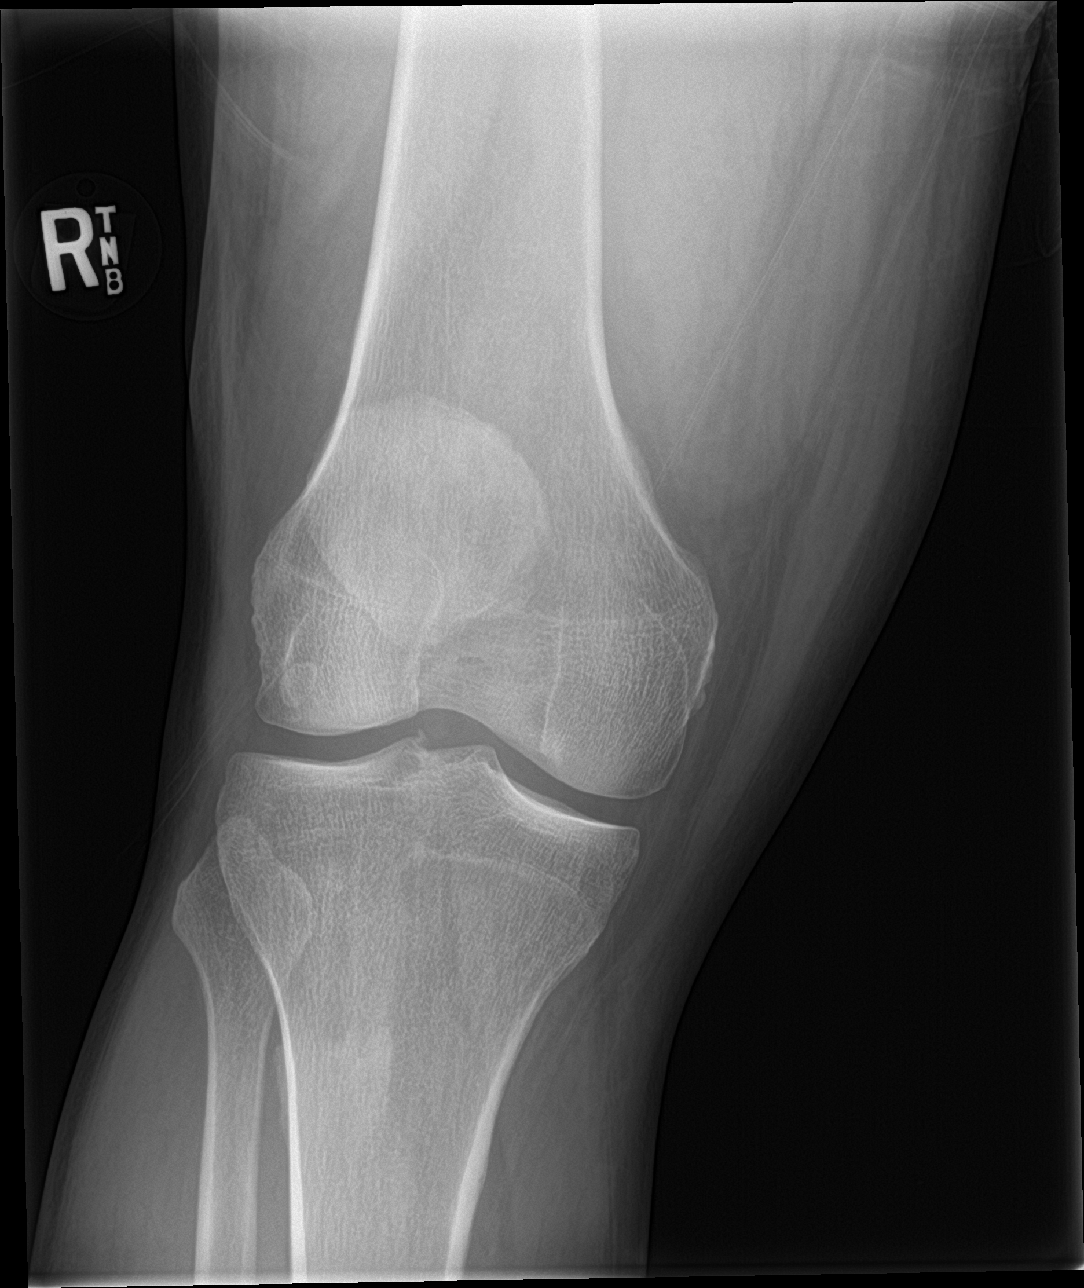

[tunnel]
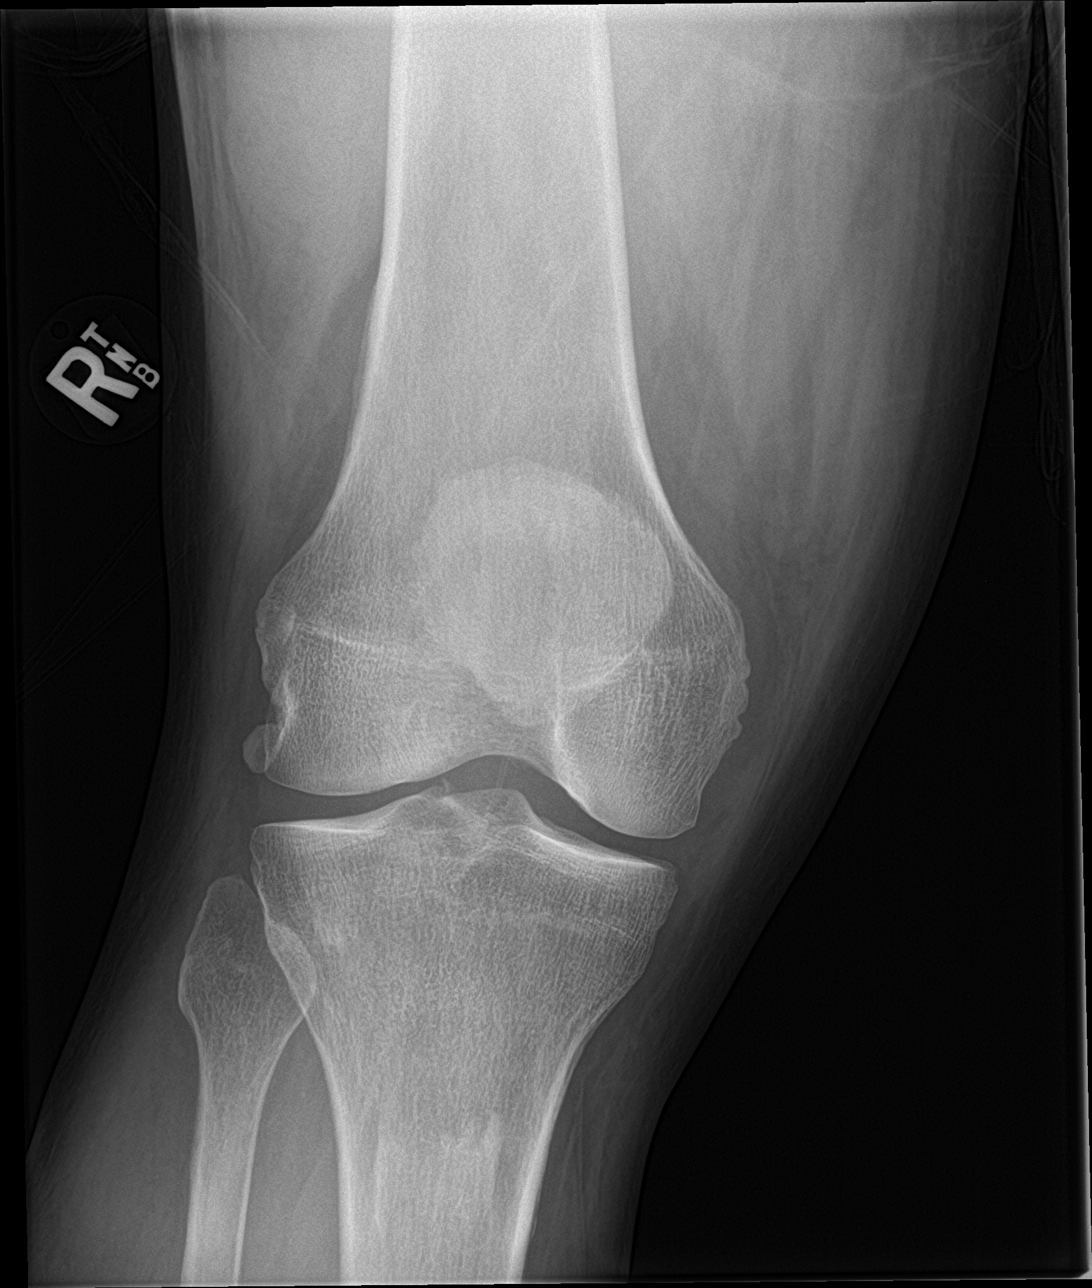

[knee lat]
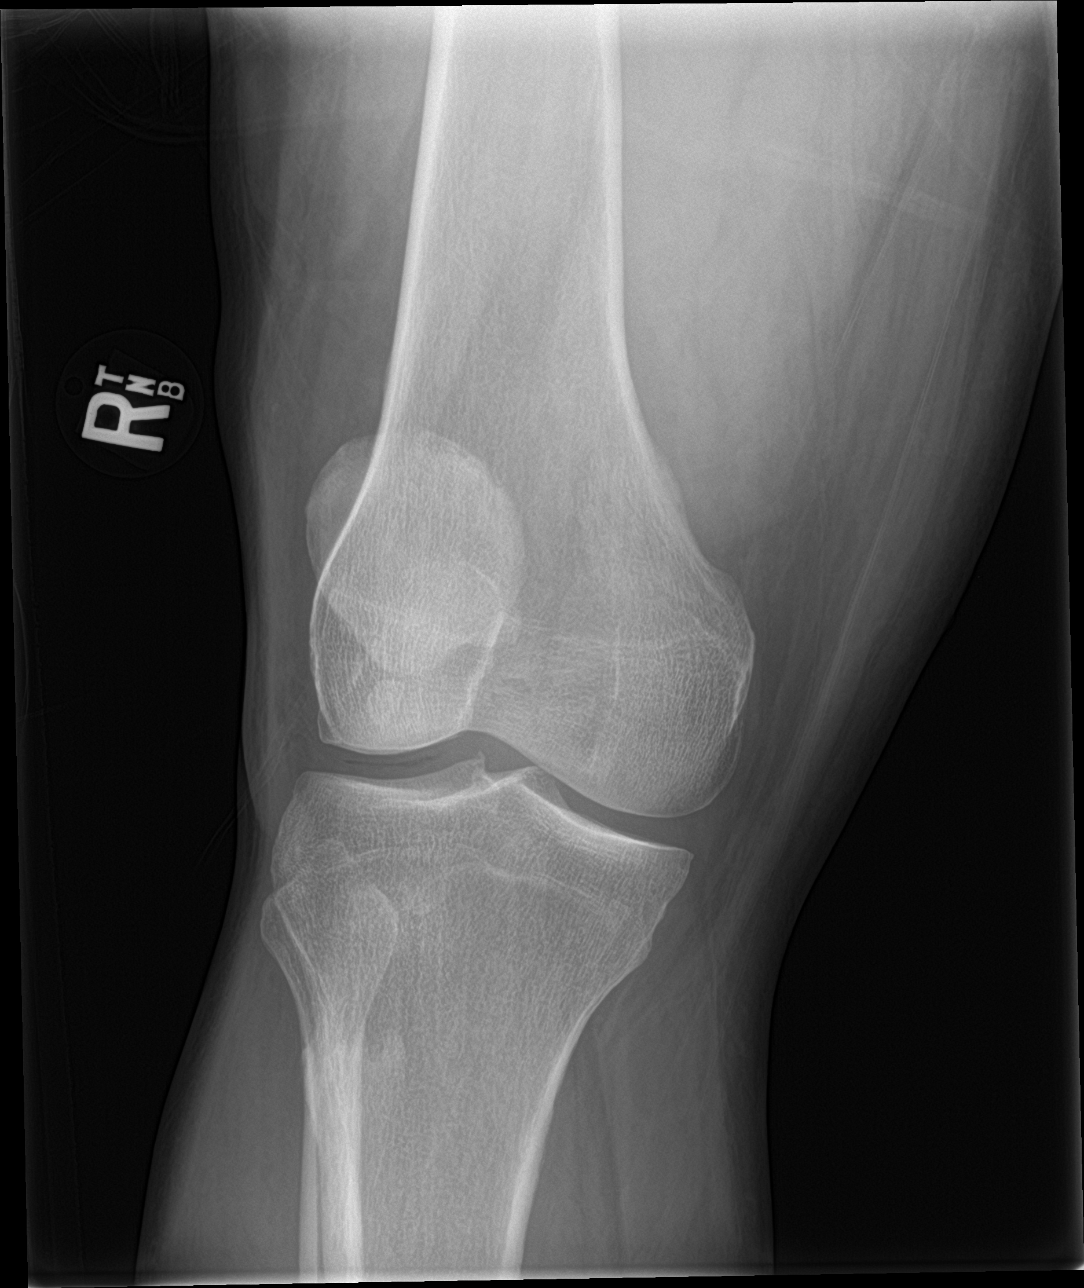

[knee sunrise]
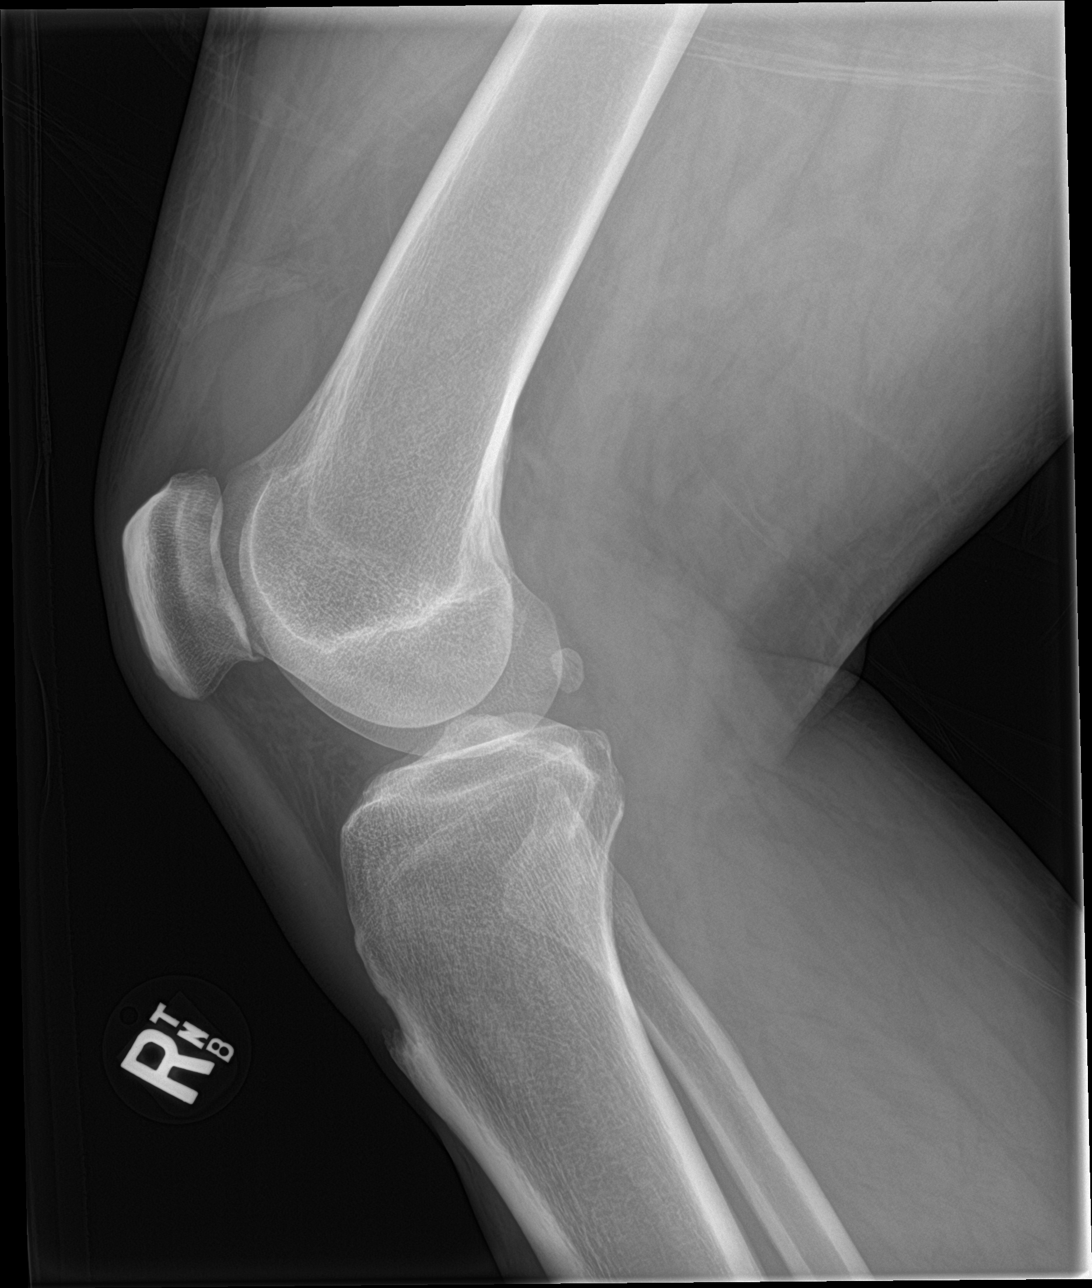

[4 of 4 positions shown; findings below may reference images not displayed]

FINDINGS: Mild to moderate patellofemoral spur formation and joint space
narrowing. Mild lateral tibial spine spur formation. Small to
moderate-sized effusion. No fracture or dislocation seen.
IMPRESSION: 1. Mild to moderate patellofemoral degenerative changes and minimal
lateral tibial spine spur formation.
2. Small to moderate-sized effusion.

## 2018-10-31 ENCOUNTER — Encounter: Payer: Self-pay | Admitting: Gastroenterology

## 2018-12-26 ENCOUNTER — Ambulatory Visit (INDEPENDENT_AMBULATORY_CARE_PROVIDER_SITE_OTHER): Payer: BLUE CROSS/BLUE SHIELD | Admitting: *Deleted

## 2018-12-26 ENCOUNTER — Other Ambulatory Visit: Payer: Self-pay

## 2018-12-26 DIAGNOSIS — Z86010 Personal history of colonic polyps: Secondary | ICD-10-CM

## 2018-12-26 MED ORDER — PEG 3350-KCL-NA BICARB-NACL 420 G PO SOLR: 4000.0000 mL | Freq: Once | ORAL | 0 refills | Status: AC

## 2018-12-26 NOTE — Progress Notes (Signed)
Gastroenterology Pre-Procedure Review  Request Date: 12/26/2018 Requesting Physician: 5 year recall (Dr. Oneida Alar 2015-tubular adenoma)  PATIENT REVIEW QUESTIONS: The patient responded to the following health history questions as indicated:    1. Diabetes Melitis: no 2. Joint replacements in the past 12 months: no 3. Major health problems in the past 3 months: no 4. Has an artificial valve or MVP: no 5. Has a defibrillator: no 6. Has been advised in past to take antibiotics in advance of a procedure like teeth cleaning: no 7. Family history of colon cancer: no  8. Alcohol Use: yes (2 to 3 drinks weekends) 9. History of sleep apnea: no  10. History of coronary artery or other vascular stents placed within the last 12 months: no 11. History of any prior anesthesia complications: no    MEDICATIONS & ALLERGIES:    Patient reports the following regarding taking any blood thinners:   Plavix? no Aspirin? no Coumadin? no Brilinta? no Xarelto? no Eliquis? no Pradaxa? no Savaysa? no Effient? no  Patient confirms/reports the following medications:  Current Outpatient Medications  Medication Sig Dispense Refill  . losartan-hydrochlorothiazide (HYZAAR) 100-12.5 MG tablet Take 1 tablet by mouth daily.     No current facility-administered medications for this visit.     Patient confirms/reports the following allergies:  No Known Allergies  No orders of the defined types were placed in this encounter.   AUTHORIZATION INFORMATION Primary Insurance: [place],  [place] #: [id number] Pre-Cert / Josem Kaufmann required: No  SCHEDULE INFORMATION: Procedure has been scheduled as follows:  Date: 04/05/2019, Time: 8:30 Location: [facility]/Dr. Oneida Alar  This Gastroenterology Pre-Precedure Review Form is being routed to the following provider(s): Neil Crouch, PA

## 2018-12-26 NOTE — Patient Instructions (Signed)
Dustin [name]   [birth year] MRN: [id number]    Procedure Date: 04/05/2019 Time to register: 7:30 Place to register: [facility] Procedure Time: 8:30 Scheduled provider: Barney Drain, MD  PREPARATION FOR COLONOSCOPY WITH TRI-LYTE SPLIT PREP  Please notify us immediately if you are diabetic, take iron supplements, or if you are on Coumadin or any other blood thinners.   You will need to purchase 1 fleet enema and 1 box of Bisacodyl '5mg'$  tablets.   1 DAY BEFORE PROCEDURE:  DATE: 04/04/2019   DAY: Thursday  clear liquids the entire day - NO SOLID FOOD.    At 2:00 pm:  Take 2 Bisacodyl tablets.   At 4:00pm:  Start drinking your solution. Make sure you mix well per instructions on the bottle. Try to drink 1 (one) 8 ounce glass every 10-15 minutes until you have consumed HALF the jug. You should complete by 6:00pm.You must keep the left over solution refrigerated until completed next day.  Continue clear liquids. You must drink plenty of clear liquids to prevent dehyration and kidney failure.     DAY OF PROCEDURE:   DATE: 04/05/2019   DAY: Friday If you take medications for your heart, blood pressure or breathing, you may take these medications.   Five hours before your procedure time @ 3:30am:  Finish remaining amout of bowel prep, drinking 1 (one) 8 ounce glass every 10-15 minutes until complete. You have two hours to consume remaining prep.   Three hours before your procedure time '@5'$ :30am:  Nothing by mouth.   At least one hour before going to the hospital:  Give yourself one Fleet enema. You may take your morning medications with sip of water unless we have instructed otherwise.      Please see below for Dietary Information.  CLEAR LIQUIDS INCLUDE:  Water Jello (NOT red in color)   Ice Popsicles (NOT red in color)   Tea (sugar ok, no milk/cream) Powdered fruit flavored drinks  Coffee (sugar ok, no milk/cream) Gatorade/ Lemonade/ Kool-Aid  (NOT red in color)   Juice:  apple, white grape, white cranberry Soft drinks  Clear bullion, consomme, broth (fat free beef/chicken/vegetable)  Carbonated beverages (any kind)  Strained chicken noodle soup Hard Candy   Remember: Clear liquids are liquids that will allow you to see your fingers on the other side of a clear glass. Be sure liquids are NOT red in color, and not cloudy, but CLEAR.  DO NOT EAT OR DRINK ANY OF THE FOLLOWING:  Dairy products of any kind   Cranberry juice Tomato juice / V8 juice   Grapefruit juice Orange juice     Red grape juice  Do not eat any solid foods, including such foods as: cereal, oatmeal, yogurt, fruits, vegetables, creamed soups, eggs, bread, crackers, pureed foods in a blender, etc.   HELPFUL HINTS FOR DRINKING PREP SOLUTION:   Make sure prep is extremely cold. Mix and refrigerate the the morning of the prep. You may also put in the freezer.   You may try mixing some Crystal Light or Country Time Lemonade if you prefer. Mix in small amounts; add more if necessary.  Try drinking through a straw  Rinse mouth with water or a mouthwash between glasses, to remove after-taste.  Try sipping on a cold beverage /ice/ popsicles between glasses of prep.  Place a piece of sugar-free hard candy in mouth between glasses.  If you become nauseated, try consuming smaller amounts, or stretch out the time between glasses.  Stop for 30-60 minutes, then slowly start back drinking.        OTHER INSTRUCTIONS  You will need a responsible adult at least 66 years of age to accompany you and drive you home. This person must remain in the waiting room during your procedure. The hospital will cancel your procedure if you do not have a responsible adult with you.   1. Wear loose fitting clothing that is easily removed. 2. Leave jewelry and other valuables at home.  3. Remove all body piercing jewelry and leave at home. 4. Total time from sign-in until discharge is approximately 2-3  hours. 5. You should go home directly after your procedure and rest. You can resume normal activities the day after your procedure. 6. The day of your procedure you should not:  Drive  Make legal decisions  Operate machinery  Drink alcohol  Return to work   You may call the office (Dept: [phone]) before 5:00pm, or page the doctor on call ([phone]) after 5:00pm, for further instructions, if necessary.   Insurance Information YOU WILL NEED TO CHECK WITH YOUR INSURANCE COMPANY FOR THE BENEFITS OF COVERAGE YOU HAVE FOR THIS PROCEDURE.  UNFORTUNATELY, NOT ALL INSURANCE COMPANIES HAVE BENEFITS TO COVER ALL OR PART OF THESE TYPES OF PROCEDURES.  IT IS YOUR RESPONSIBILITY TO CHECK YOUR BENEFITS, HOWEVER, WE WILL BE GLAD TO ASSIST YOU WITH ANY CODES YOUR INSURANCE COMPANY MAY NEED.    PLEASE NOTE THAT MOST INSURANCE COMPANIES WILL NOT COVER A SCREENING COLONOSCOPY FOR PEOPLE UNDER THE AGE OF 50  IF YOU HAVE BCBS INSURANCE, YOU MAY HAVE BENEFITS FOR A SCREENING COLONOSCOPY BUT IF POLYPS ARE FOUND THE DIAGNOSIS WILL CHANGE AND THEN YOU MAY HAVE A DEDUCTIBLE THAT WILL NEED TO BE MET. SO PLEASE MAKE SURE YOU CHECK YOUR BENEFITS FOR A SCREENING COLONOSCOPY AS WELL AS A DIAGNOSTIC COLONOSCOPY.  

## 2018-12-27 NOTE — Progress Notes (Signed)
Ok to schedule.

## 2018-12-27 NOTE — Addendum Note (Signed)
Addended by: Metro Kung on: 12/27/2018 02:16 PM   Modules accepted: Orders, SmartSet

## 2019-02-12 DIAGNOSIS — M1711 Unilateral primary osteoarthritis, right knee: Secondary | ICD-10-CM | POA: Diagnosis not present

## 2019-02-12 DIAGNOSIS — I10 Essential (primary) hypertension: Secondary | ICD-10-CM | POA: Diagnosis not present

## 2019-02-12 DIAGNOSIS — F17200 Nicotine dependence, unspecified, uncomplicated: Secondary | ICD-10-CM | POA: Diagnosis not present

## 2019-03-12 ENCOUNTER — Other Ambulatory Visit: Payer: Self-pay

## 2019-03-12 ENCOUNTER — Ambulatory Visit: Payer: BLUE CROSS/BLUE SHIELD | Admitting: Orthopaedic Surgery

## 2019-03-12 ENCOUNTER — Encounter: Payer: Self-pay | Admitting: Orthopaedic Surgery

## 2019-03-12 VITALS — BP 145/86 | HR 77 | Temp 98.2°F | Ht 69.0 in | Wt 241.0 lb

## 2019-03-12 DIAGNOSIS — G8929 Other chronic pain: Secondary | ICD-10-CM | POA: Diagnosis not present

## 2019-03-12 DIAGNOSIS — M25561 Pain in right knee: Secondary | ICD-10-CM | POA: Diagnosis not present

## 2019-03-12 NOTE — Progress Notes (Signed)
Subjective:    Patient ID: Dustin [name], male    DOB: 11/09/1950, 68 y.o.   MRN: [id number]  HPI He has had pain in the right knee for over six months or so.  He has had swelling, popping and more recently giving way.  He ha no redness.  He had x-rays done 10-04-18 which showed some mild DJD.  He is getting worse.  He has seen Dr. Legrand Rams.  He has tried Advil, heat, ice, Icy Hot, brace with little help.  He would like something done for the knee.  He is a smoker.  I have told him to try to cut back.   Review of Systems  Constitutional: Positive for activity change.  Musculoskeletal: Positive for arthralgias, gait problem and joint swelling.  All other systems reviewed and are negative.  For Review of Systems, all other systems reviewed and are negative.  The following is a summary of the past history medically, past history surgically, known current medicines, social history and family history.  This information is gathered electronically by the computer from prior information and documentation.  I review this each visit and have found including this information at this point in the chart is beneficial and informative.   Past Medical History:  Diagnosis Date  . Arthritis   . Colon polyps   . HTN (hypertension)   . Joint pain     Past Surgical History:  Procedure Laterality Date  . COLONOSCOPY  10/07/2008   [facility]:LEXNTZGY colorectal polyps/Small internal hemorrhoids. multiple simple adenomas.  . COLONOSCOPY N/A 11/26/2013   Procedure: COLONOSCOPY;  Surgeon: Danie Binder, MD;  Location: [facility];  Service: Endoscopy;  Laterality: N/A;  10:15AM  . LYMPH NODE DISSECTION Bilateral 10/01/2014   Procedure: LYMPH NODE DISSECTION;  Surgeon: Malka So, MD;  Location: [facility];  Service: Urology;  Laterality: Bilateral;  . RECTAL SURGERY  2010   Ziegler: necrotic hemorrhoidal tissue  . ROBOT ASSISTED LAPAROSCOPIC RADICAL PROSTATECTOMY N/A 10/01/2014   Procedure: ROBOTIC ASSISTED  LAPAROSCOPIC RADICAL PROSTATECTOMY;  Surgeon: Malka So, MD;  Location: [facility];  Service: Urology;  Laterality: N/A;  . UMBILICAL HERNIA REPAIR N/A 10/01/2014   Procedure: HERNIA REPAIR UMBILICAL ADULT;  Surgeon: Ralene Ok, MD;  Location: [facility];  Service: General;  Laterality: N/A;    Current Outpatient Medications on File Prior to Visit  Medication Sig Dispense Refill  . losartan-hydrochlorothiazide (HYZAAR) 100-12.5 MG tablet Take 1 tablet by mouth daily.     No current facility-administered medications on file prior to visit.     Social History   Socioeconomic History  . Marital status: Married    Spouse name: Not on file  . Number of children: 0  . Years of education: Not on file  . Highest education level: Not on file  Occupational History  . Not on file  Social Needs  . Financial resource strain: Not on file  . Food insecurity:    Worry: Not on file    Inability: Not on file  . Transportation needs:    Medical: Not on file    Non-medical: Not on file  Tobacco Use  . Smoking status: Current Every Day Smoker    Packs/day: 0.50    Years: 40.00    Pack years: 20.00    Types: Cigarettes  . Smokeless tobacco: Never Used  Substance and Sexual Activity  . Alcohol use: Yes    Comment: occasional  . Drug use: No  . Sexual activity:  Not on file  Lifestyle  . Physical activity:    Days per week: Not on file    Minutes per session: Not on file  . Stress: Not on file  Relationships  . Social connections:    Talks on phone: Not on file    Gets together: Not on file    Attends religious service: Not on file    Active member of club or organization: Not on file    Attends meetings of clubs or organizations: Not on file    Relationship status: Not on file  . Intimate partner violence:    Fear of current or ex partner: Not on file    Emotionally abused: Not on file    Physically abused: Not on file    Forced sexual activity: Not on file  Other Topics Concern   . Not on file  Social History Narrative  . Not on file    Family History  Problem Relation Age of Onset  . Colon cancer Neg Hx     BP (!) 145/86   Pulse 77   Temp 98.2 F (36.8 C)   Ht 5\' 9"  (1.753 m)   Wt 241 lb (109.3 kg)   BMI 35.59 kg/m   Body mass index is 35.59 kg/m.     Objective:   Physical Exam Vitals signs reviewed.  Constitutional:      Appearance: He is well-developed.  HENT:     Head: Normocephalic and atraumatic.  Eyes:     Conjunctiva/sclera: Conjunctivae normal.     Pupils: Pupils are equal, round, and reactive to light.  Neck:     Musculoskeletal: Normal range of motion and neck supple.  Cardiovascular:     Rate and Rhythm: Normal rate and regular rhythm.  Pulmonary:     Effort: Pulmonary effort is normal.  Abdominal:     Palpations: Abdomen is soft.  Musculoskeletal:     Right knee: He exhibits decreased range of motion and swelling. Tenderness found. Medial joint line tenderness noted.       Legs:  Skin:    General: Skin is warm and dry.  Neurological:     Mental Status: He is alert and oriented to person, place, and time.     Cranial Nerves: No cranial nerve deficit.     Motor: No abnormal muscle tone.     Coordination: Coordination normal.     Deep Tendon Reflexes: Reflexes are normal and symmetric. Reflexes normal.  Psychiatric:        Behavior: Behavior normal.        Thought Content: Thought content normal.        Judgment: Judgment normal.     I have reviewed the x-rays from 10-04-18.      Assessment & Plan:   Encounter Diagnosis  Name Primary?  . Chronic pain of right knee Yes   I am concerned about a medial meniscus tear.  I will get MRI.  PROCEDURE NOTE:  The patient requests injections of the right knee , verbal consent was obtained.  The right knee was prepped appropriately after time out was performed.   Sterile technique was observed and injection of 1 cc of Depo-Medrol 40 mg with several cc's of plain  xylocaine. Anesthesia was provided by ethyl chloride and a 20-gauge needle was used to inject the knee area. The injection was tolerated well.  A band aid dressing was applied.  The patient was advised to apply ice later today and tomorrow to the  injection sight as needed.  Return after the MRI.  Call if any problem.  Precautions discussed.   Electronically Signed Sanjuana Kava, MD 5/26/20209:02 AM

## 2019-03-25 ENCOUNTER — Encounter: Payer: Self-pay | Admitting: *Deleted

## 2019-03-25 ENCOUNTER — Telehealth: Payer: Self-pay | Admitting: *Deleted

## 2019-03-25 NOTE — Telephone Encounter (Signed)
Pt is scheduled for COVID 19 screening on 04/02/2019.  Pt is aware to remain in quarantine once testing is done.  Pt voiced understanding and requested a work note.  He will come by to pick it up.

## 2019-04-01 ENCOUNTER — Telehealth: Payer: Self-pay | Admitting: Gastroenterology

## 2019-04-01 NOTE — Telephone Encounter (Signed)
Papers are in the drawer upfront and I LMOM for patient that it's a $29 fee and he can pick them up

## 2019-04-01 NOTE — Telephone Encounter (Signed)
Dustin [name] has located the paperwork and notified the patient.

## 2019-04-01 NOTE — Telephone Encounter (Signed)
Pt said he dropped off FMLA papers and was following up on them. I don't remember him coming in last week and can't locate where the forms are. He said he has the work note, but needed the FMLA forms. He will be calling back this afternoon.

## 2019-04-02 ENCOUNTER — Other Ambulatory Visit (HOSPITAL_COMMUNITY)
Admission: RE | Admit: 2019-04-02 | Discharge: 2019-04-02 | Disposition: A | Discharge status: Home / Self Care | Payer: BC Managed Care – PPO | Source: Ambulatory Visit | Attending: Gastroenterology | Admitting: Gastroenterology

## 2019-04-02 ENCOUNTER — Other Ambulatory Visit: Payer: Self-pay

## 2019-04-02 DIAGNOSIS — Z1159 Encounter for screening for other viral diseases: Secondary | ICD-10-CM | POA: Insufficient documentation

## 2019-04-03 LAB — NOVEL CORONAVIRUS, NAA (HOSP ORDER, SEND-OUT TO REF LAB; TAT 18-24 HRS): SARS-CoV-2, NAA: NOT DETECTED

## 2019-04-03 NOTE — Telephone Encounter (Signed)
REVIEWED-NO ADDITIONAL RECOMMENDATIONS. 

## 2019-04-05 ENCOUNTER — Encounter (HOSPITAL_COMMUNITY)
Admission: RE | Disposition: A | Discharge status: Home / Self Care | Payer: Self-pay | Source: Home / Self Care | Attending: Gastroenterology

## 2019-04-05 ENCOUNTER — Other Ambulatory Visit: Payer: Self-pay

## 2019-04-05 ENCOUNTER — Ambulatory Visit (HOSPITAL_COMMUNITY)
Admission: RE | Admit: 2019-04-05 | Discharge: 2019-04-05 | Disposition: A | Discharge status: Home / Self Care | Payer: BC Managed Care – PPO | Attending: Gastroenterology | Admitting: Gastroenterology

## 2019-04-05 ENCOUNTER — Encounter (HOSPITAL_COMMUNITY): Payer: Self-pay | Admitting: *Deleted

## 2019-04-05 DIAGNOSIS — D125 Benign neoplasm of sigmoid colon: Secondary | ICD-10-CM | POA: Insufficient documentation

## 2019-04-05 DIAGNOSIS — Z79899 Other long term (current) drug therapy: Secondary | ICD-10-CM | POA: Diagnosis not present

## 2019-04-05 DIAGNOSIS — I10 Essential (primary) hypertension: Secondary | ICD-10-CM | POA: Insufficient documentation

## 2019-04-05 DIAGNOSIS — Z86010 Personal history of colonic polyps: Secondary | ICD-10-CM

## 2019-04-05 DIAGNOSIS — K5730 Diverticulosis of large intestine without perforation or abscess without bleeding: Secondary | ICD-10-CM | POA: Diagnosis not present

## 2019-04-05 DIAGNOSIS — K635 Polyp of colon: Secondary | ICD-10-CM

## 2019-04-05 DIAGNOSIS — D123 Benign neoplasm of transverse colon: Secondary | ICD-10-CM | POA: Insufficient documentation

## 2019-04-05 DIAGNOSIS — Z1211 Encounter for screening for malignant neoplasm of colon: Secondary | ICD-10-CM | POA: Diagnosis not present

## 2019-04-05 DIAGNOSIS — M1990 Unspecified osteoarthritis, unspecified site: Secondary | ICD-10-CM | POA: Insufficient documentation

## 2019-04-05 DIAGNOSIS — K648 Other hemorrhoids: Secondary | ICD-10-CM | POA: Insufficient documentation

## 2019-04-05 DIAGNOSIS — F17210 Nicotine dependence, cigarettes, uncomplicated: Secondary | ICD-10-CM | POA: Diagnosis not present

## 2019-04-05 HISTORY — PX: COLONOSCOPY: SHX5424

## 2019-04-05 HISTORY — PX: POLYPECTOMY: SHX5525

## 2019-04-05 SURGERY — COLONOSCOPY
Anesthesia: Moderate Sedation

## 2019-04-05 MED ORDER — STERILE WATER FOR IRRIGATION IR SOLN
Status: DC | PRN
  Administered 2019-04-05: 2.5 mL

## 2019-04-05 MED ORDER — MEPERIDINE HCL 100 MG/ML IJ SOLN
INTRAMUSCULAR | Status: AC
  Filled 2019-04-05: qty 2

## 2019-04-05 MED ORDER — MIDAZOLAM HCL 5 MG/5ML IJ SOLN
INTRAMUSCULAR | Status: DC | PRN
  Administered 2019-04-05: 2 mg via INTRAVENOUS
  Administered 2019-04-05: 1 mg via INTRAVENOUS
  Administered 2019-04-05: 2 mg via INTRAVENOUS

## 2019-04-05 MED ORDER — SODIUM CHLORIDE 0.9 % IV SOLN
INTRAVENOUS | Status: DC
  Administered 2019-04-05: 08:00:00 via INTRAVENOUS

## 2019-04-05 MED ORDER — MEPERIDINE HCL 100 MG/ML IJ SOLN
INTRAMUSCULAR | Status: DC | PRN
  Administered 2019-04-05: 50 mg via INTRAVENOUS
  Administered 2019-04-05: 25 mg via INTRAVENOUS

## 2019-04-05 MED ORDER — MIDAZOLAM HCL 5 MG/5ML IJ SOLN
INTRAMUSCULAR | Status: AC
  Filled 2019-04-05: qty 10

## 2019-04-05 NOTE — Discharge Instructions (Signed)
You have MODERATE internal hemorrhoids and diverticulosis IN YOUR LEFT AND RIGHT COLON. YOU HAD FOUR POLYPS REMOVED.    DRINK WATER TO KEEP YOUR URINE LIGHT YELLOW.  CONTINUE YOUR WEIGHT LOSS EFFORTS. YOUR BODY MASS INDEX IS OVER 30 WHICH MEANS YOU ARE OBESE. OBESITY IS ASSOCIATED WITH AN INCREASE FOR ALL CANCERS, INCLUDING ESOPHAGEAL AND COLON CANCER.  FOLLOW A HIGH FIBER DIET. AVOID ITEMS THAT CAUSE BLOATING. See info below.   USE PREPARATION H FOUR TIMES  A DAY IF NEEDED TO RELIEVE RECTAL PAIN/PRESSURE/BLEEDING.   YOUR BIOPSY RESULTS WILL BE BACK IN 5 BUSINESS DAYS.  Next colonoscopy in 3 YEARS IF THREE SIMPLE ADENOMAS REMOVED AND OTHERWISE IN 5 years.  Colonoscopy Care After Read the instructions outlined below and refer to this sheet in the next week. These discharge instructions provide you with general information on caring for yourself after you leave the hospital. While your treatment has been planned according to the most current medical practices available, unavoidable complications occasionally occur. If you have any problems or questions after discharge, call DR. Ani Deoliveira, [phone].  ACTIVITY  You may resume your regular activity, but move at a slower pace for the next 24 hours.   Take frequent rest periods for the next 24 hours.   Walking will help get rid of the air and reduce the bloated feeling in your belly (abdomen).   No driving for 24 hours (because of the medicine (anesthesia) used during the test).   You may shower.   Do not sign any important legal documents or operate any machinery for 24 hours (because of the anesthesia used during the test).    NUTRITION  Drink plenty of fluids.   You may resume your normal diet as instructed by your doctor.   Begin with a light meal and progress to your normal diet. Heavy or fried foods are harder to digest and may make you feel sick to your stomach (nauseated).   Avoid alcoholic beverages for 24 hours or as  instructed.    MEDICATIONS  You may resume your normal medications.   WHAT YOU CAN EXPECT TODAY  Some feelings of bloating in the abdomen.   Passage of more gas than usual.   Spotting of blood in your stool or on the toilet paper  .  IF YOU HAD POLYPS REMOVED DURING THE COLONOSCOPY:  Eat a soft diet IF YOU HAVE NAUSEA, BLOATING, ABDOMINAL PAIN, OR VOMITING.    FINDING OUT THE RESULTS OF YOUR TEST Not all test results are available during your visit. DR. Oneida Alar WILL CALL YOU WITHIN 14 DAYS OF YOUR PROCEDUE WITH YOUR RESULTS. Do not assume everything is normal if you have not heard from DR. Emila Steinhauser, CALL HER OFFICE AT [phone].  SEEK IMMEDIATE MEDICAL ATTENTION AND CALL THE OFFICE: [phone] IF:  You have more than a spotting of blood in your stool.   Your belly is swollen (abdominal distention).   You are nauseated or vomiting.   You have a temperature over 101F.   You have abdominal pain or discomfort that is severe or gets worse throughout the day.  High-Fiber Diet A high-fiber diet changes your normal diet to include more whole grains, legumes, fruits, and vegetables. Changes in the diet involve replacing refined carbohydrates with unrefined foods. The calorie level of the diet is essentially unchanged. The Dietary Reference Intake (recommended amount) for adult males is 38 grams per day. For adult females, it is 25 grams per day. Pregnant and lactating women should consume  28 grams of fiber per day. Fiber is the intact part of a plant that is not broken down during digestion. Functional fiber is fiber that has been isolated from the plant to provide a beneficial effect in the body.  PURPOSE  Increase stool bulk.   Ease and regulate bowel movements.   Lower cholesterol.   REDUCE RISK OF COLON CANCER  INDICATIONS THAT YOU NEED MORE FIBER  Constipation and hemorrhoids.   Uncomplicated diverticulosis (intestine condition) and irritable bowel syndrome.    Weight management.   As a protective measure against hardening of the arteries (atherosclerosis), diabetes, and cancer.   GUIDELINES FOR INCREASING FIBER IN THE DIET  Start adding fiber to the diet slowly. A gradual increase of about 5 more grams (2 slices of whole-wheat bread, 2 servings of most fruits or vegetables, or 1 bowl of high-fiber cereal) per day is best. Too rapid an increase in fiber may result in constipation, flatulence, and bloating.   Drink enough water and fluids to keep your urine clear or pale yellow. Water, juice, or caffeine-free drinks are recommended. Not drinking enough fluid may cause constipation.   Eat a variety of high-fiber foods rather than one type of fiber.   Try to increase your intake of fiber through using high-fiber foods rather than fiber pills or supplements that contain small amounts of fiber.   The goal is to change the types of food eaten. Do not supplement your present diet with high-fiber foods, but replace foods in your present diet.   INCLUDE A VARIETY OF FIBER SOURCES  Replace refined and processed grains with whole grains, canned fruits with fresh fruits, and incorporate other fiber sources. White rice, white breads, and most bakery goods contain little or no fiber.   Brown whole-grain rice, buckwheat oats, and many fruits and vegetables are all good sources of fiber. These include: broccoli, Brussels sprouts, cabbage, cauliflower, beets, sweet potatoes, white potatoes (skin on), carrots, tomatoes, eggplant, squash, berries, fresh fruits, and dried fruits.   Cereals appear to be the richest source of fiber. Cereal fiber is found in whole grains and bran. Bran is the fiber-rich outer coat of cereal grain, which is largely removed in refining. In whole-grain cereals, the bran remains. In breakfast cereals, the largest amount of fiber is found in those with "bran" in their names. The fiber content is sometimes indicated on the label.   You may  need to include additional fruits and vegetables each day.   In baking, for 1 cup white flour, you may use the following substitutions:   1 cup whole-wheat flour minus 2 tablespoons.   1/2 cup white flour plus 1/2 cup whole-wheat flour.   Polyps, Colon  A polyp is extra tissue that grows inside your body. Colon polyps grow in the large intestine. The large intestine, also called the colon, is part of your digestive system. It is a long, hollow tube at the end of your digestive tract where your body makes and stores stool. Most polyps are not dangerous. They are benign. This means they are not cancerous. But over time, some types of polyps can turn into cancer. Polyps that are smaller than a pea are usually not harmful. But larger polyps could someday become or may already be cancerous. To be safe, doctors remove all polyps and test them.   PREVENTION There is not one sure way to prevent polyps. You might be able to lower your risk of getting them if you:  Eat more fruits  and vegetables and less fatty food.   Do not smoke.   Avoid alcohol.   Exercise every day.   Lose weight if you are overweight.   Eating more calcium and folate can also lower your risk of getting polyps. Some foods that are rich in calcium are milk, cheese, and broccoli. Some foods that are rich in folate are chickpeas, kidney beans, and spinach.    Diverticulosis Diverticulosis is a common condition that develops when small pouches (diverticula) form in the wall of the colon. The risk of diverticulosis increases with age. It happens more often in people who eat a low-fiber diet. Most individuals with diverticulosis have no symptoms. Those individuals with symptoms usually experience belly (abdominal) pain, constipation, or loose stools (diarrhea).  HOME CARE INSTRUCTIONS  Increase the amount of fiber in your diet as directed by your caregiver or dietician. This may reduce symptoms of diverticulosis.   Drink at  least 6 to 8 glasses of water each day to prevent constipation.   Try not to strain when you have a bowel movement.   Avoiding nuts and seeds to prevent complications is NOT NECESSARY.   FOODS HAVING HIGH FIBER CONTENT INCLUDE:  Fruits. Apple, peach, pear, tangerine, raisins, prunes.   Vegetables. Brussels sprouts, asparagus, broccoli, cabbage, carrot, cauliflower, romaine lettuce, spinach, summer squash, tomato, winter squash, zucchini.   Starchy Vegetables. Baked beans, kidney beans, lima beans, split peas, lentils, potatoes (with skin).   Grains. Whole wheat bread, brown rice, bran flake cereal, plain oatmeal, white rice, shredded wheat, bran muffins.   SEEK IMMEDIATE MEDICAL CARE IF:  You develop increasing pain or severe bloating.   You have an oral temperature above 101F.   You develop vomiting or bowel movements that are bloody or black.   Hemorrhoids Hemorrhoids are dilated (enlarged) veins around the rectum. Sometimes clots will form in the veins. This makes them swollen and painful. These are called thrombosed hemorrhoids. Causes of hemorrhoids include:  Constipation.   Straining to have a bowel movement.   HEAVY LIFTING   HOME CARE INSTRUCTIONS  Eat a well balanced diet and drink 6 to 8 glasses of water every day to avoid constipation. You may also use a bulk laxative.   Avoid straining to have bowel movements.   Keep anal area dry and clean.   Do not use a donut shaped pillow or sit on the toilet for long periods. This increases blood pooling and pain.   Move your bowels when your body has the urge; this will require less straining and will decrease pain and pressure.

## 2019-04-05 NOTE — H&P (Signed)
Primary Care Physician:  Rosita Fire, MD Primary Gastroenterologist:  Dr. Oneida Alar  Pre-Procedure History & Physical: HPI:  Dustin [name] is a 68 y.o. male here for  PERSONAL HISTORY OF POLYPS.  Past Medical History:  Diagnosis Date  . Arthritis   . Colon polyps   . HTN (hypertension)   . Joint pain     Past Surgical History:  Procedure Laterality Date  . COLONOSCOPY  10/07/2008   [facility]:[id number] colorectal polyps/Small internal hemorrhoids. multiple simple adenomas.  . COLONOSCOPY N/A 11/26/2013   Procedure: COLONOSCOPY;  Surgeon: Danie Binder, MD;  Location: [facility];  Service: Endoscopy;  Laterality: N/A;  10:15AM  . LYMPH NODE DISSECTION Bilateral 10/01/2014   Procedure: LYMPH NODE DISSECTION;  Surgeon: Malka So, MD;  Location: [facility];  Service: Urology;  Laterality: Bilateral;  . RECTAL SURGERY  2010   Ziegler: necrotic hemorrhoidal tissue  . ROBOT ASSISTED LAPAROSCOPIC RADICAL PROSTATECTOMY N/A 10/01/2014   Procedure: ROBOTIC ASSISTED LAPAROSCOPIC RADICAL PROSTATECTOMY;  Surgeon: Malka So, MD;  Location: [facility];  Service: Urology;  Laterality: N/A;  . UMBILICAL HERNIA REPAIR N/A 10/01/2014   Procedure: HERNIA REPAIR UMBILICAL ADULT;  Surgeon: Ralene Ok, MD;  Location: [facility];  Service: General;  Laterality: N/A;    Prior to Admission medications   Medication Sig Start Date End Date Taking? Authorizing Provider  GAVILYTE-G 236 g solution Take 40,000 mLs by mouth once. 04/01/19  Yes [provider]  GOODYS EXTRA STRENGTH [phone] MG PACK Take 1 packet by mouth daily as needed (pain.).   Yes [provider]  hydrochlorothiazide (HYDRODIURIL) 12.5 MG tablet Take 12.5 mg by mouth daily. 04/01/19  Yes [provider]  ibuprofen (ADVIL) 200 MG tablet Take 400 mg by mouth 2 (two) times daily as needed (arthritis).   Yes [provider]  losartan (COZAAR) 100 MG tablet Take 100 mg by mouth daily. 01/02/19  Yes [provider]  naproxen sodium (ALEVE) 220 MG tablet Take 220 mg by mouth.   Yes [provider]  Omega-3 Fatty Acids (OMEGA 3 PO) Take 1 capsule by mouth daily.   Yes [provider]    Allergies as of 12/27/2018  . (No Known Allergies)    Family History  Problem Relation Age of Onset  . Colon cancer Neg Hx     Social History   Socioeconomic History  . Marital status: Married    Spouse name: Not on file  . Number of children: 0  . Years of education: Not on file  . Highest education level: Not on file  Occupational History  . Not on file  Social Needs  . Financial resource strain: Not on file  . Food insecurity    Worry: Not on file    Inability: Not on file  . Transportation needs    Medical: Not on file    Non-medical: Not on file  Tobacco Use  . Smoking status: Current Every Day Smoker    Packs/day: 0.50    Years: 40.00    Pack years: 20.00    Types: Cigarettes  . Smokeless tobacco: Never Used  Substance and Sexual Activity  . Alcohol use: Yes    Comment: occasional  . Drug use: No  . Sexual activity: Not on file  Lifestyle  . Physical activity    Days per week: Not on file    Minutes per session: Not on file  . Stress: Not on file  Relationships  . Social  connections    Talks on phone: Not on file    Gets together: Not on file    Attends religious service: Not on file    Active member of club or organization: Not on file    Attends meetings of clubs or organizations: Not on file    Relationship status: Not on file  . Intimate partner violence    Fear of current or ex partner: Not on file    Emotionally abused: Not on file    Physically abused: Not on file    Forced sexual activity: Not on file  Other Topics Concern  . Not on file  Social History Narrative  . Not on file    Review of Systems: See HPI, otherwise negative ROS   Physical Exam: BP 129/79   Pulse 68   Temp 98.2 F (36.8 C) (Oral)   Resp 17   Ht 5\' 9"   (1.753 m)   Wt 108.9 kg   SpO2 99%   BMI 35.44 kg/m  General:   Alert,  pleasant and cooperative in NAD Head:  Normocephalic and atraumatic. Neck:  Supple; Lungs:  Clear throughout to auscultation.    Heart:  Regular rate and rhythm. Abdomen:  Soft, nontender and nondistended. Normal bowel sounds, without guarding, and without rebound.   Neurologic:  Alert and  oriented x4;  grossly normal neurologically.  Impression/Plan:     PERSONAL HISTORY OF POLYPS.  PLAN: 1. TCS TODAY. DISCUSSED PROCEDURE, BENEFITS, & RISKS: < 1% chance of medication reaction, bleeding, perforation, ASPIRATION, or rupture of spleen/liver requiring surgery to fix it and missed polyps < 1 cm 10-20% of the time.

## 2019-04-05 NOTE — Op Note (Signed)
[facility] Patient Name: Dustin [name] Procedure Date: 04/05/2019 8:37 AM MRN: [id number] Date of Birth: Jun 10, 1951 Attending MD: Barney Drain MD, MD CSN: [id number] Age: 68 Admit Type: Outpatient Procedure:                Colonoscopy WITH COLD SNARE POLYPECTOMY Indications:              Personal history of colonic polyps Providers:                Barney Drain MD, MD, Lurline Del, RN, Nelma Rothman,                            Technician Referring MD:             Rosita Fire MD, MD Medicines:                Meperidine 75 mg IV, Midazolam 5 mg IV Complications:            No immediate complications. Estimated Blood Loss:     Estimated blood loss was minimal. Procedure:                Pre-Anesthesia Assessment:                           - Prior to the procedure, a History and Physical                            was performed, and patient medications and                            allergies were reviewed. The patient's tolerance of                            previous anesthesia was also reviewed. The risks                            and benefits of the procedure and the sedation                            options and risks were discussed with the patient.                            All questions were answered, and informed consent                            was obtained. Prior Anticoagulants: The patient has                            taken no previous anticoagulant or antiplatelet                            agents except for NSAID medication. ASA Grade                            Assessment: II - A patient with mild systemic  disease. After reviewing the risks and benefits,                            the patient was deemed in satisfactory condition to                            undergo the procedure. After obtaining informed                            consent, the colonoscope was passed under direct                            vision. Throughout the procedure,  the patient's                            blood pressure, pulse, and oxygen saturations were                            monitored continuously. The CF-HQ190L ([id number])                            scope was introduced through the anus and advanced                            to the the cecum, identified by appendiceal orifice                            and ileocecal valve. The colonoscopy was somewhat                            difficult due to a tortuous colon. Successful                            completion of the procedure was aided by                            straightening and shortening the scope to obtain                            bowel loop reduction and COLOWRAP. The patient                            tolerated the procedure well. The quality of the                            bowel preparation was excellent. The ileocecal                            valve, appendiceal orifice, and rectum were                            photographed. Scope In: 8:57:30 AM Scope Out: 9:14:00 AM Scope Withdrawal Time: 0 hours 14 minutes 19 seconds  Total Procedure Duration: 0  hours 16 minutes 30 seconds  Findings:      Four sessile polyps were found in the sigmoid colon and hepatic flexure.       The polyps were 3 to 4 mm in size. These polyps were removed with a cold       snare. Resection and retrieval were complete.      Multiple small and large-mouthed diverticula were found in the entire       colon.      External and internal hemorrhoids were found. The hemorrhoids were       moderate.      The recto-sigmoid colon and sigmoid colon were mildly tortuous. Impression:               - Four 3 to 4 mm polyps in the sigmoid colon(3) and                            at the hepatic flexure, removed with a cold snare.                            Resected and retrieved.                           - MODERATE Diverticulosis in the entire examined                            colon.                           -  External and internal hemorrhoids.                           - Tortuous colon. Moderate Sedation:      Moderate (conscious) sedation was administered by the endoscopy nurse       and supervised by the endoscopist. The following parameters were       monitored: oxygen saturation, heart rate, blood pressure, and response       to care. Total physician intraservice time was 27 minutes. Recommendation:           - Patient has a contact number available for                            emergencies. The signs and symptoms of potential                            delayed complications were discussed with the                            patient. Return to normal activities tomorrow.                            Written discharge instructions were provided to the                            patient.                           - High  fiber diet.                           - Continue present medications.                           - Await pathology results.                           - Repeat colonoscopy in 3 - 5 years for                            surveillance. Procedure Code(s):        --- Professional ---                           [phone], Colonoscopy, flexible; with removal of                            tumor(s), polyp(s), or other lesion(s) by snare                            technique                           99153, Moderate sedation; each additional 15                            minutes intraservice time                           G0500, Moderate sedation services provided by the                            same physician or other qualified health care                            professional performing a gastrointestinal                            endoscopic service that sedation supports,                            requiring the presence of an independent trained                            observer to assist in the monitoring of the                            patient's level of consciousness and  physiological                            status; initial 15 minutes of intra-service time;                            patient age 68 years or older (additional time 40  be reported with [phone], as appropriate) Diagnosis Code(s):        --- Professional ---                           K63.5, Polyp of colon                           K64.8, Other hemorrhoids                           Z86.010, Personal history of colonic polyps                           K57.30, Diverticulosis of large intestine without                            perforation or abscess without bleeding                           Q43.8, Other specified congenital malformations of                            intestine CPT copyright 2019 [employer]. All rights reserved. The codes documented in this report are preliminary and upon coder review may  be revised to meet current compliance requirements. Barney Drain, MD Barney Drain MD, MD 04/05/2019 9:32:54 AM This report has been signed electronically. Number of Addenda: 0

## 2019-04-06 ENCOUNTER — Other Ambulatory Visit: Payer: Self-pay

## 2019-04-06 ENCOUNTER — Ambulatory Visit
Admission: RE | Admit: 2019-04-06 | Discharge: 2019-04-06 | Disposition: A | Discharge status: Home / Self Care | Payer: BC Managed Care – PPO | Source: Ambulatory Visit | Attending: Orthopaedic Surgery | Admitting: Orthopaedic Surgery

## 2019-04-06 DIAGNOSIS — M25561 Pain in right knee: Secondary | ICD-10-CM | POA: Diagnosis not present

## 2019-04-06 DIAGNOSIS — G8929 Other chronic pain: Secondary | ICD-10-CM

## 2019-04-06 IMAGING — MR MRI OF THE RIGHT KNEE WITHOUT CONTRAST
6 series · 38 of 40 positions shown · non-contrast
Comparison: Plain films right knee [DATE].

CLINICAL DATA: Anterior and medial right knee pain, swelling and
tightness. Symptoms are chronic. No known injury.

EXAM:
MRI OF THE RIGHT KNEE WITHOUT CONTRAST
TECHNIQUE: Multiplanar, multisequence MR imaging of the knee was performed. No
intravenous contrast was administered.

[Series 6: T2 fat-sat · axial · right · 4.0mm · 0.50mm/px · z∈[-59,+94]mm · 9 of 36 slices shown (1 of 3)]
[im 1/36]
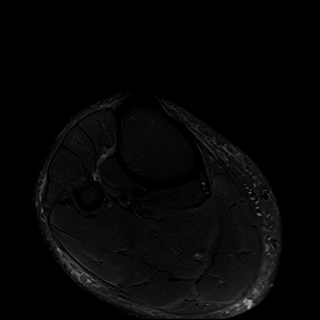
[im 5/36]
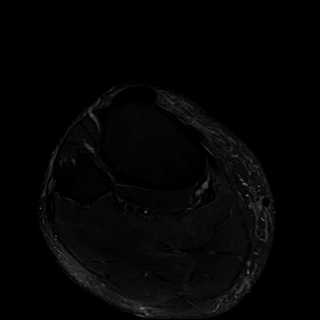
[im 9/36]
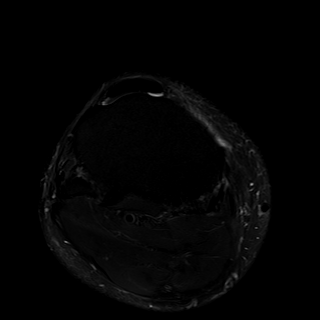
[im 14/36]
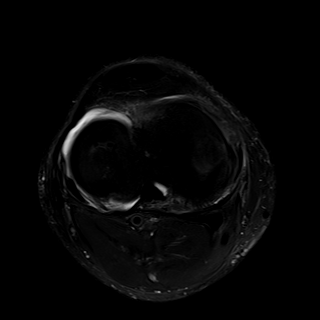
[im 18/36]
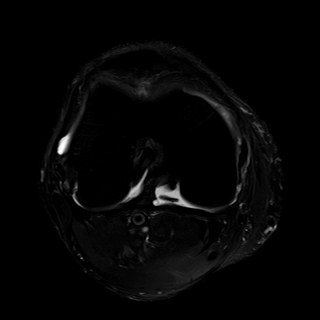
[im 22/36]
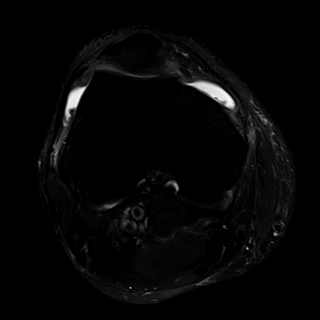
[im 27/36]
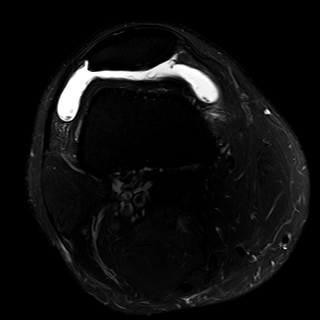
[im 31/36]
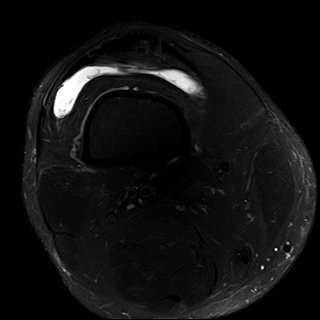
[im 36/36]
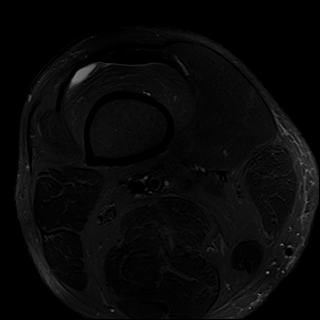

[Series 7: T2 fat-sat · coronal · right · 4.0mm · 0.39mm/px · 6 of 28 slices shown (2 of 3)]
[im 1/28]
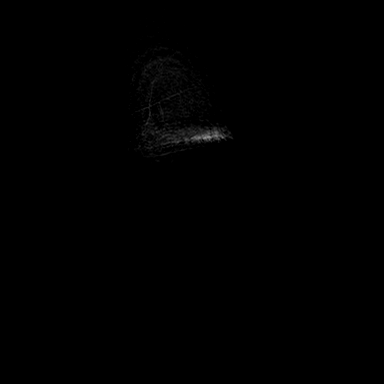
[im 6/28]
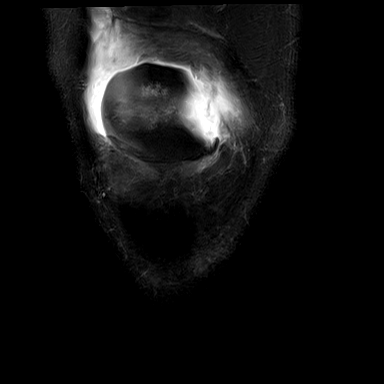
[im 11/28]
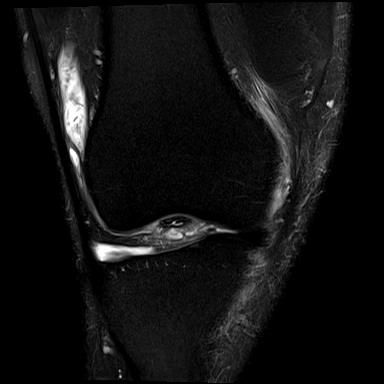
[im 17/28]
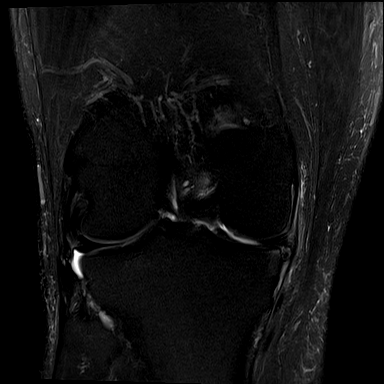
[im 22/28]
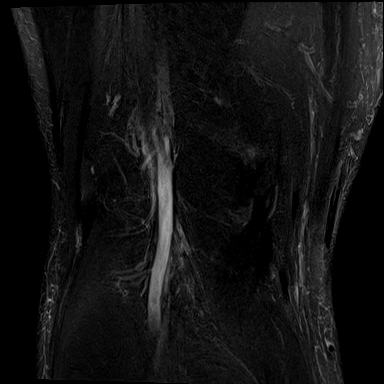
[im 28/28]
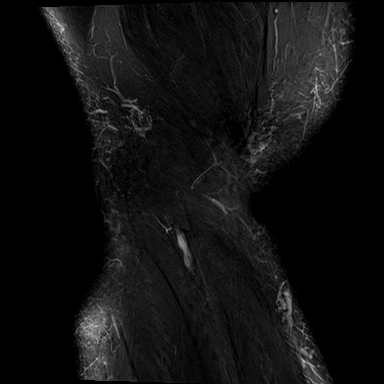

[Series 8: T1 · coronal · right · 4.0mm · 0.39mm/px · 4 of 28 slices shown]
[im 1/28]
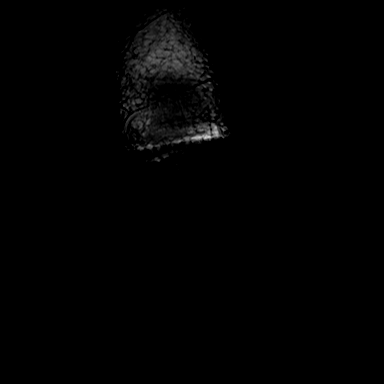
[im 6/28]
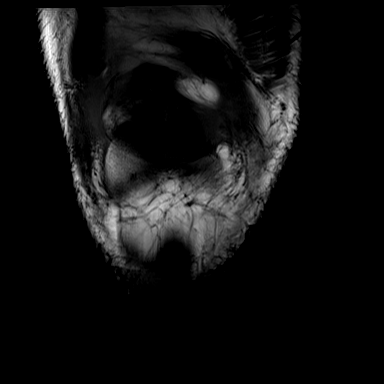
[im 11/28]
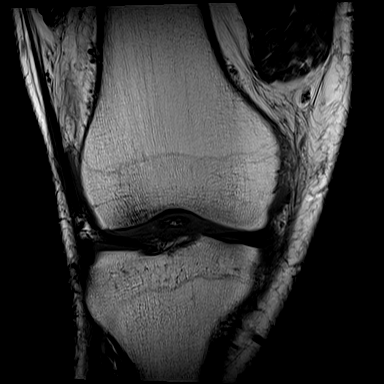
[im 17/28]
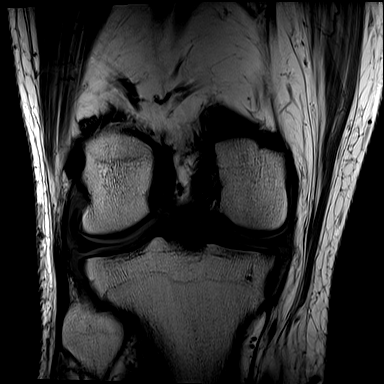

[Series 9: PD fat-sat · coronal · right · 3.0mm · 0.47mm/px · 7 of 34 slices shown (1 of 2)]
[im 1/34]
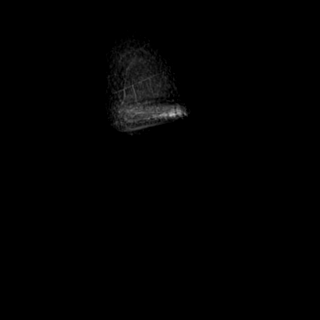
[im 6/34]
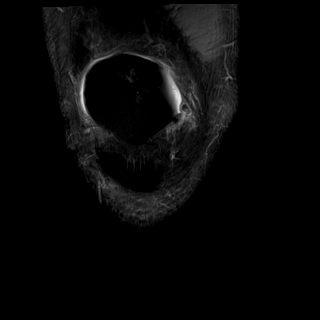
[im 12/34]
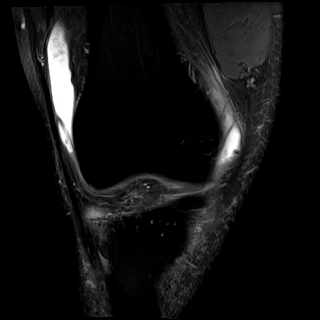
[im 17/34]
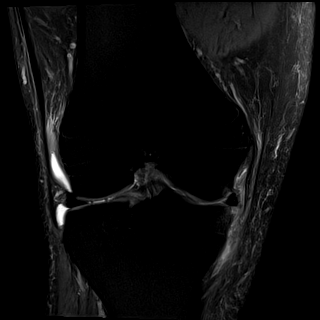
[im 23/34]
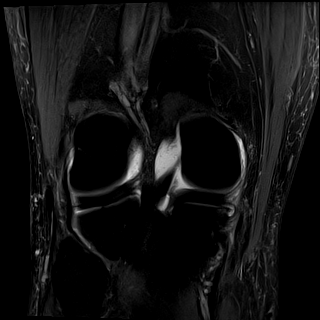
[im 28/34]
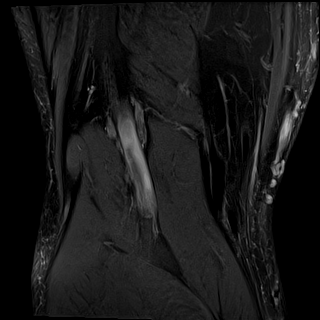
[im 34/34]
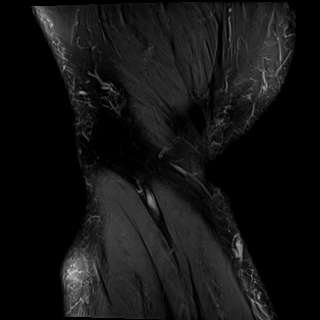

[Series 10: PD fat-sat · sagittal · right · 3.0mm · 0.39mm/px · 6 of 30 slices shown (2 of 2)]
[im 1/30]
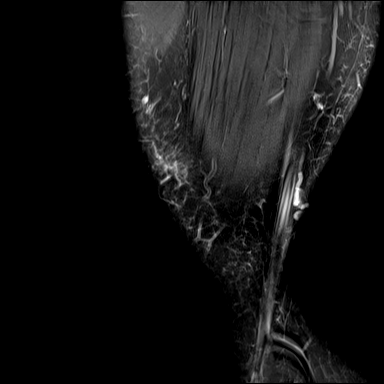
[im 6/30]
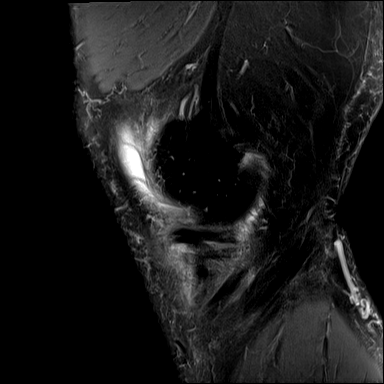
[im 12/30]
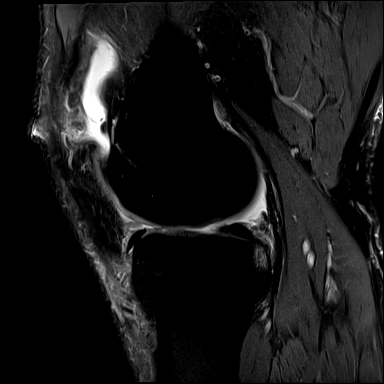
[im 18/30]
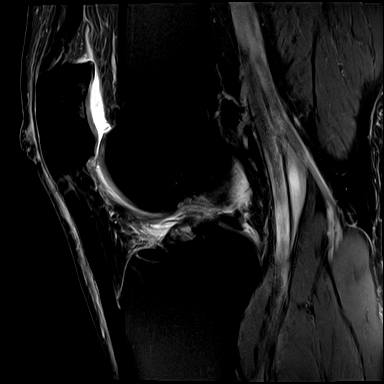
[im 24/30]
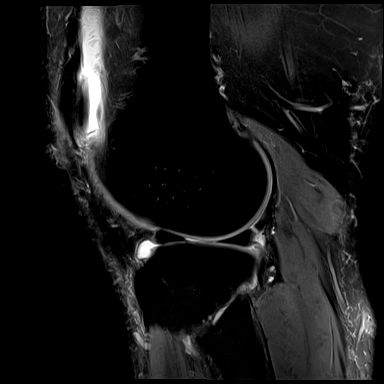
[im 30/30]
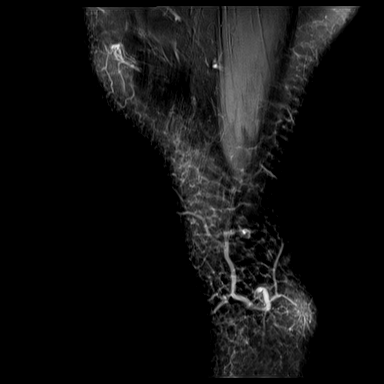

[Series 11: T2 fat-sat · sagittal · right · 3.0mm · 0.39mm/px · 6 of 30 slices shown (3 of 3)]
[im 1/30]
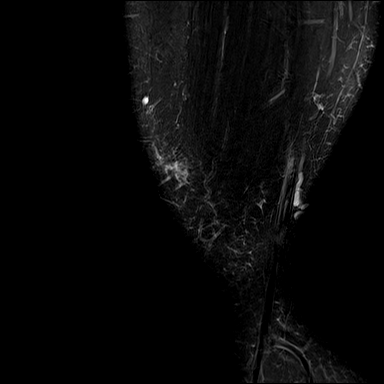
[im 6/30]
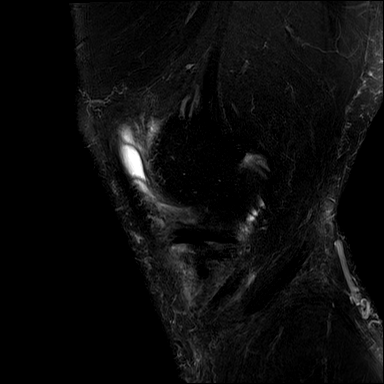
[im 12/30]
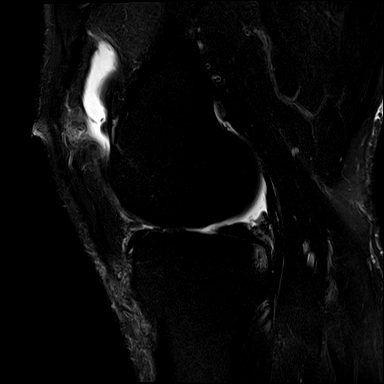
[im 18/30]
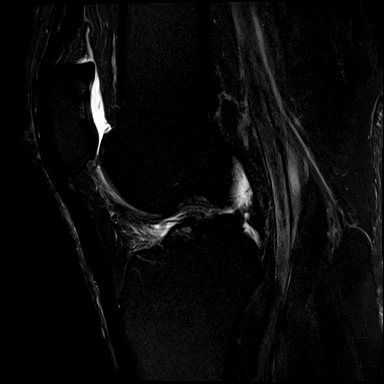
[im 24/30]
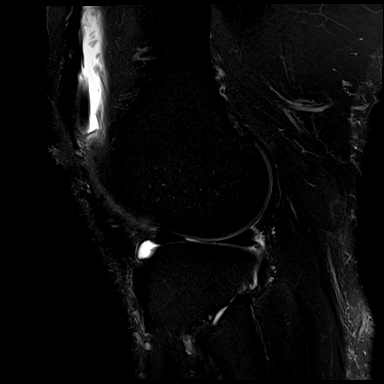
[im 30/30]
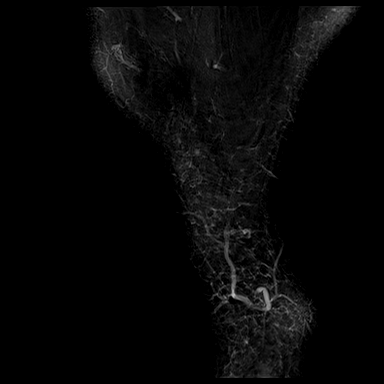

[38 of 40 positions shown; findings below may reference images not displayed]

FINDINGS: MENISCI

Medial meniscus: Horizontal tear in the posterior horn extends into
the posterior body. A tiny flap of meniscus is displaced along the
medial tibia deep to the MCL. The body is degenerated and diminutive
with blunting along its free edge throughout.

Lateral meniscus: Intact.

LIGAMENTS

Cruciates:  Intact.

Collaterals:  Intact.

CARTILAGE

Patellofemoral:  Cartilage of the patella is diffusely thinned.

Medial:  Moderately thinned and irregular.

Lateral:  Minimally degenerated.

Joint:  Small effusion.

Popliteal Fossa:  No Baker's cyst.

Extensor Mechanism:  Intact.

Bones: Minimal marrow edema is seen about the periphery of the
medial compartment and tiny foci of subchondral edema are seen in
the patella at the apex. No fracture or worrisome lesion.

Other: None.
IMPRESSION: Horizontal tear posterior and posterior body of the medial meniscus.
The body of the medial meniscus is degenerated with blunting along
its free edge throughout.

Mild to moderate osteoarthritis most notable in the medial and
patellofemoral compartments.

## 2019-04-08 ENCOUNTER — Telehealth: Payer: Self-pay | Admitting: Gastroenterology

## 2019-04-08 NOTE — Telephone Encounter (Signed)
Please call pt. He had FOUR HYPERPLASTIC POLYPS removed.   DRINK WATER TO KEEP YOUR URINE LIGHT YELLOW.  CONTINUE YOUR WEIGHT LOSS EFFORTS.  FOLLOW A HIGH FIBER DIET. AVOID ITEMS THAT CAUSE BLOATING.   USE PREPARATION H FOUR TIMES  A DAY IF NEEDED TO RELIEVE RECTAL PAIN/PRESSURE/BLEEDING.   Next colonoscopy in 5 years.

## 2019-04-08 NOTE — Telephone Encounter (Signed)
Left message with a family member. Pt is at work and will call back tomorrow 04/09/2019.

## 2019-04-09 NOTE — Telephone Encounter (Signed)
Reminder in epic °

## 2019-04-09 NOTE — Telephone Encounter (Signed)
PT is aware.

## 2019-04-10 ENCOUNTER — Encounter (HOSPITAL_COMMUNITY): Payer: Self-pay | Admitting: Gastroenterology

## 2019-05-15 DIAGNOSIS — F17210 Nicotine dependence, cigarettes, uncomplicated: Secondary | ICD-10-CM | POA: Diagnosis not present

## 2019-05-15 DIAGNOSIS — F17200 Nicotine dependence, unspecified, uncomplicated: Secondary | ICD-10-CM | POA: Diagnosis not present

## 2019-05-15 DIAGNOSIS — I10 Essential (primary) hypertension: Secondary | ICD-10-CM | POA: Diagnosis not present

## 2019-05-15 DIAGNOSIS — J410 Simple chronic bronchitis: Secondary | ICD-10-CM | POA: Diagnosis not present

## 2019-05-21 ENCOUNTER — Other Ambulatory Visit: Payer: Self-pay

## 2019-05-21 ENCOUNTER — Ambulatory Visit (INDEPENDENT_AMBULATORY_CARE_PROVIDER_SITE_OTHER): Payer: BC Managed Care – PPO | Admitting: Orthopaedic Surgery

## 2019-05-21 ENCOUNTER — Encounter: Payer: Self-pay | Admitting: Orthopaedic Surgery

## 2019-05-21 VITALS — BP 137/84 | HR 64 | Temp 98.2°F | Ht 69.0 in | Wt 232.0 lb

## 2019-05-21 DIAGNOSIS — M25561 Pain in right knee: Secondary | ICD-10-CM | POA: Diagnosis not present

## 2019-05-21 DIAGNOSIS — G8929 Other chronic pain: Secondary | ICD-10-CM

## 2019-05-21 NOTE — Progress Notes (Signed)
Patient IP:Dustin [name], male DOB:1950-11-27, 68 y.o. NLZ:[id number]  Chief Complaint  Patient presents with  . Knee Pain    right   . Results    review MRI     HPI  Dustin [name] is a 68 y.o. male who has right knee pain. He had MRI showing: IMPRESSION: Horizontal tear posterior and posterior body of the medial meniscus. The body of the medial meniscus is degenerated with blunting along its free edge throughout.  Mild to moderate osteoarthritis most notable in the medial and patellofemoral compartments.  I have explained the findings to him.  I have recommended arthroscopy.  I have told him about the outpatient procedure.  I will have Dr. Aline Brochure see him.   Body mass index is 34.26 kg/m.  ROS  Review of Systems  Constitutional: Positive for activity change.  Musculoskeletal: Positive for arthralgias, gait problem and joint swelling.  All other systems reviewed and are negative.   All other systems reviewed and are negative.  The following is a summary of the past history medically, past history surgically, known current medicines, social history and family history.  This information is gathered electronically by the computer from prior information and documentation.  I review this each visit and have found including this information at this point in the chart is beneficial and informative.    Past Medical History:  Diagnosis Date  . Arthritis   . Colon polyps   . HTN (hypertension)   . Joint pain     Past Surgical History:  Procedure Laterality Date  . COLONOSCOPY  10/07/2008   TKW:[id number] colorectal polyps/Small internal hemorrhoids. multiple simple adenomas.  . COLONOSCOPY N/A 11/26/2013   Procedure: COLONOSCOPY;  Surgeon: Danie Binder, MD;  [facility]: [facility];  Service: Endoscopy;  Laterality: N/A;  10:15AM  . COLONOSCOPY N/A 04/05/2019   Procedure: COLONOSCOPY;  Surgeon: Danie Binder, MD;  Location: [facility];  Service: Endoscopy;   Laterality: N/A;  8:30  . LYMPH NODE DISSECTION Bilateral 10/01/2014   Procedure: LYMPH NODE DISSECTION;  Surgeon: Malka So, MD;  Location: [facility];  Service: Urology;  Laterality: Bilateral;  . POLYPECTOMY  04/05/2019   Procedure: POLYPECTOMY;  Surgeon: Danie Binder, MD;  Location: [facility];  Service: Endoscopy;;  hepatic flexure, sigmoid x3  . RECTAL SURGERY  2010   Ziegler: necrotic hemorrhoidal tissue  . ROBOT ASSISTED LAPAROSCOPIC RADICAL PROSTATECTOMY N/A 10/01/2014   Procedure: ROBOTIC ASSISTED LAPAROSCOPIC RADICAL PROSTATECTOMY;  Surgeon: Malka So, MD;  Location: [facility];  Service: Urology;  Laterality: N/A;  . UMBILICAL HERNIA REPAIR N/A 10/01/2014   Procedure: HERNIA REPAIR UMBILICAL ADULT;  Surgeon: Ralene Ok, MD;  Location: [facility];  Service: General;  Laterality: N/A;    Family History  Problem Relation Age of Onset  . Colon cancer Neg Hx     Social History Social History   Tobacco Use  . Smoking status: Current Every Day Smoker    Packs/day: 0.50    Years: 40.00    Pack years: 20.00    Types: Cigarettes  . Smokeless tobacco: Never Used  Substance Use Topics  . Alcohol use: Yes    Comment: occasional  . Drug use: No    No Known Allergies  Current Outpatient Medications  Medication Sig Dispense Refill  . GOODYS EXTRA STRENGTH 500-325-65 MG PACK Take 1 packet by mouth daily as needed (pain.).    Marland Kitchen hydrochlorothiazide (HYDRODIURIL) 12.5 MG tablet Take 12.5 mg by mouth  daily.    . ibuprofen (ADVIL) 200 MG tablet Take 400 mg by mouth 2 (two) times daily as needed (arthritis).    Marland Kitchen losartan (COZAAR) 100 MG tablet Take 100 mg by mouth daily.    . naproxen sodium (ALEVE) 220 MG tablet Take 220 mg by mouth.    . Omega-3 Fatty Acids (OMEGA 3 PO) Take 1 capsule by mouth daily.     No current facility-administered medications for this visit.      Physical Exam  Blood pressure 137/84, pulse 64, temperature 98.2 F (36.8 C), height 5\' 9"  (1.753 m),  weight 232 lb (105.2 kg).  Constitutional: overall normal hygiene, normal nutrition, well developed, normal grooming, normal body habitus. Assistive device:none  Musculoskeletal: gait and station Limp right, muscle tone and strength are normal, no tremors or atrophy is present.  .  Neurological: coordination overall normal.  Deep tendon reflex/nerve stretch intact.  Sensation normal.  Cranial nerves II-XII intact.   Skin:   Normal overall no scars, lesions, ulcers or rashes. No psoriasis.  Psychiatric: Alert and oriented x 3.  Recent memory intact, remote memory unclear.  Normal mood and affect. Well groomed.  Good eye contact.  Cardiovascular: overall no swelling, no varicosities, no edema bilaterally, normal temperatures of the legs and arms, no clubbing, cyanosis and good capillary refill.  Lymphatic: palpation is normal.  All other systems reviewed and are negative   The patient has been educated about the nature of the problem(s) and counseled on treatment options.  The patient appeared to understand what I have discussed and is in agreement with it.  Encounter Diagnosis  Name Primary?  . Chronic pain of right knee Yes    PLAN Call if any problems.  Precautions discussed.  Continue current medications.   Return to clinic to see Dr. Aline Brochure for knee surgery   Electronically Signed Sanjuana Kava, MD 8/4/20209:51 AM

## 2019-05-27 ENCOUNTER — Other Ambulatory Visit: Payer: Self-pay

## 2019-05-27 ENCOUNTER — Ambulatory Visit (INDEPENDENT_AMBULATORY_CARE_PROVIDER_SITE_OTHER): Payer: BC Managed Care – PPO | Admitting: Orthopedic Surgery

## 2019-05-27 ENCOUNTER — Encounter: Payer: Self-pay | Admitting: Orthopedic Surgery

## 2019-05-27 VITALS — BP 125/73 | HR 83 | Ht 69.0 in | Wt 234.0 lb

## 2019-05-27 DIAGNOSIS — M1710 Unilateral primary osteoarthritis, unspecified knee: Secondary | ICD-10-CM | POA: Diagnosis not present

## 2019-05-27 DIAGNOSIS — M23321 Other meniscus derangements, posterior horn of medial meniscus, right knee: Secondary | ICD-10-CM

## 2019-05-27 NOTE — Progress Notes (Signed)
Dustin [name]  05/27/2019  HISTORY SECTION :  Chief Complaint  Patient presents with  . Knee Pain    Right knee pain, referred by Dr. Luna Glasgow.   HPI The patient presents for evaluation of right knee pain which he has had for years no trauma he got an injection took some ibuprofen and Aleve did not get better had an MRI which shows a torn medial meniscus and arthritis sort of diffusely about the knee Location medial joint line  Quality dull ache Severity moderate Associated with mechanical symptoms  Review of Systems  All other systems reviewed and are negative.   Family History  Problem Relation Age of Onset  . Colon cancer Neg Hx    Social History   Tobacco Use  . Smoking status: Current Every Day Smoker    Packs/day: 0.50    Years: 40.00    Pack years: 20.00    Types: Cigarettes  . Smokeless tobacco: Never Used  Substance Use Topics  . Alcohol use: Yes    Comment: occasional  . Drug use: No     Past Medical History:  Diagnosis Date  . Arthritis   . Colon polyps   . HTN (hypertension)   . Joint pain     Past Surgical History:  Procedure Laterality Date  . COLONOSCOPY  10/07/2008   PJA:[id number] colorectal polyps/Small internal hemorrhoids. multiple simple adenomas.  . COLONOSCOPY N/A 11/26/2013   Procedure: COLONOSCOPY;  Surgeon: Danie Binder, MD;  [facility];  Service: Endoscopy;  Laterality: N/A;  10:15AM  . COLONOSCOPY N/A 04/05/2019   Procedure: COLONOSCOPY;  Surgeon: Danie Binder, MD;  [facility];  Service: Endoscopy;  Laterality: N/A;  8:30  . LYMPH NODE DISSECTION Bilateral 10/01/2014   Procedure: LYMPH NODE DISSECTION;  Surgeon: Malka So, MD;  Location: [facility];  Service: Urology;  Laterality: Bilateral;  . POLYPECTOMY  04/05/2019   Procedure: POLYPECTOMY;  Surgeon: Danie Binder, MD;  [facility];  Service: Endoscopy;;  hepatic flexure, sigmoid x3  . RECTAL SURGERY  2010   Ziegler: necrotic  hemorrhoidal tissue  . ROBOT ASSISTED LAPAROSCOPIC RADICAL PROSTATECTOMY N/A 10/01/2014   Procedure: ROBOTIC ASSISTED LAPAROSCOPIC RADICAL PROSTATECTOMY;  Surgeon: Malka So, MD;  Location: [facility];  Service: Urology;  Laterality: N/A;  . UMBILICAL HERNIA REPAIR N/A 10/01/2014   Procedure: HERNIA REPAIR UMBILICAL ADULT;  Surgeon: Ralene Ok, MD;  Location: [facility];  Service: General;  Laterality: N/A;     No Known Allergies   Current Outpatient Medications:  .  GOODYS EXTRA STRENGTH 500-325-65 MG PACK, Take 1 packet by mouth daily as needed (pain.)., Disp: , Rfl:  .  hydrochlorothiazide (HYDRODIURIL) 12.5 MG tablet, Take 12.5 mg by mouth daily., Disp: , Rfl:  .  ibuprofen (ADVIL) 200 MG tablet, Take 400 mg by mouth 2 (two) times daily as needed (arthritis)., Disp: , Rfl:  .  losartan (COZAAR) 100 MG tablet, Take 100 mg by mouth daily., Disp: , Rfl:  .  naproxen sodium (ALEVE) 220 MG tablet, Take 220 mg by mouth., Disp: , Rfl:  .  Omega-3 Fatty Acids (OMEGA 3 PO), Take 1 capsule by mouth daily., Disp: , Rfl:    PHYSICAL EXAM SECTION: 1) BP 125/73   Pulse 83   Ht 5\' 9"  (1.753 m)   Wt 234 lb (106.1 kg)   BMI 34.56 kg/m   Body mass index is 34.56 kg/m. General appearance: Well-developed well-nourished no gross deformities  2) Cardiovascular normal pulse and perfusion in all 4 extremities normal color without edema  3) Neurologically deep tendon reflexes are equal and normal, no sensation loss or deficits no pathologic reflexes  4) Psychological: Awake alert and oriented x3 mood and affect normal  5) Skin no lacerations or ulcerations no nodularity no palpable masses, no erythema or nodularity  6) Musculoskeletal:   Right knee medial joint line is tender there is no joint effusion and his range of motion seems normal.  Ligaments are stable muscle tone is excellent strength is normal in the knee  MEDICAL DECISION SECTION:  Encounter Diagnoses  Name Primary?  . Derangement  of posterior horn of medial meniscus of right knee Yes  . Primary localized osteoarthritis of knee     Imaging X-ray 4 views of the knee do not really show any arthritis  MRI shows a torn medial meniscus with arthritis in 2 compartments   CLINICAL DATA:  Anterior and medial right knee pain, swelling and tightness. Symptoms are chronic. No known injury.   EXAM: MRI OF THE RIGHT KNEE WITHOUT CONTRAST   TECHNIQUE: Multiplanar, multisequence MR imaging of the knee was performed. No intravenous contrast was administered.   COMPARISON:  Plain films right knee 10/04/2018.   FINDINGS: MENISCI   Medial meniscus: Horizontal tear in the posterior horn extends into the posterior body. A tiny flap of meniscus is displaced along the medial tibia deep to the MCL. The body is degenerated and diminutive with blunting along its free edge throughout.   Lateral meniscus: Intact.   LIGAMENTS   Cruciates:  Intact.   Collaterals:  Intact.   CARTILAGE   Patellofemoral:  Cartilage of the patella is diffusely thinned.   Medial:  Moderately thinned and irregular.   Lateral:  Minimally degenerated.   Joint:  Small effusion.   Popliteal Fossa:  No Baker's cyst.   Extensor Mechanism:  Intact.   Bones: Minimal marrow edema is seen about the periphery of the medial compartment and tiny foci of subchondral edema are seen in the patella at the apex. No fracture or worrisome lesion.   Other: None.   IMPRESSION: Horizontal tear posterior and posterior body of the medial meniscus. The body of the medial meniscus is degenerated with blunting along its free edge throughout.   Mild to moderate osteoarthritis most notable in the medial and patellofemoral compartments.     Electronically Signed   By: Inge Rise M.D.   On: 04/06/2019 21:29  Plan:  (Rx., Inj., surg., Frx, MRI/CT, XR:2)  The procedure has been fully reviewed with the patient; The risks and benefits of surgery have  been discussed and explained and understood. Alternative treatment has also been reviewed, questions were encouraged and answered. The postoperative plan is also been reviewed.   Arthroscopy right knee medial meniscectomy  Patient aware may need total knee in the future  Expect out of work 6 weeks    12:03 PM [name], MD  05/27/2019

## 2019-05-27 NOTE — Patient Instructions (Signed)
Out of work note 6 weeks from surgery  Meniscus Injury, Arthroscopy   Arthroscopy is a surgical procedure that involves the use of a small scope that has a camera and surgical instruments on the end (arthroscope). An arthroscope can be used to repair your meniscus injury.  LET [facility] CARE PROVIDER KNOW ABOUT:  Any allergies you have.  All medicines you are taking, including vitamins, herbs, eyedrops, creams, and over-the-counter medicines.  Any recent colds or infections you have had or currently have.  Previous problems you or members of your family have had with the use of anesthetics.  Any blood disorders or blood clotting problems you have.  Previous surgeries you have had.  Medical conditions you have. RISKS AND COMPLICATIONS Generally, this is a safe procedure. However, as with any procedure, problems can occur. Possible problems include:  Damage to nerves or blood vessels.  Excess bleeding.  Blood clots.  Infection. BEFORE THE PROCEDURE  Do not eat or drink for 6-8 hours before the procedure.  Take medicines as directed by your surgeon. Ask your surgeon about changing or stopping your regular medicines.  You may have lab tests the morning of surgery. PROCEDURE  You will be given one of the following:   A medicine that numbs the area (local anesthesia).  A medicine that makes you go to sleep (general anesthesia).  A medicine injected into your spine that numbs your body below the waist (spinal anesthesia). Most often, several small cuts (incisions) are made in the knee. The arthroscope and instruments go into the incisions to repair the damage. The torn portion of the meniscus is removed.   AFTER THE PROCEDURE  You will be taken to the recovery area where your progress will be monitored. When you are awake, stable, and taking fluids without complications, you will be allowed to go home. This is usually the same day. A torn or stretched ligament (ligament  sprain) may take 6-8 weeks to heal.   It takes about the 4-6 WEEKS if your surgeon removed a torn meniscus.  A repaired meniscus may require 6-12 weeks of recovery time.  A torn ligament needing reconstructive surgery may take 6-12 months to heal fully.   This information is not intended to replace advice given to you by your health care provider. Make sure you discuss any questions you have with your health care provider. You have decided to proceed with operative arthroscopy of the knee. You have decided not to continue with nonoperative measures such as but not limited to oral medication, weight loss, activity modification, physical therapy, bracing, or injection.  We will perform operative arthroscopy of the knee. Some of the risks associated with arthroscopic surgery of the knee include but are not limited to Bleeding Infection Swelling Stiffness Blood clot Pain Need for knee replacement surgery    In compliance with recent [place] law in federal regulation regarding opioid use and abuse and addiction, we will taper (stop) opioid medication after 2 weeks.  If you're not comfortable with these risks and would like to continue with nonoperative treatment please let Dr. Aline Brochure know prior to your surgery.

## 2019-05-30 ENCOUNTER — Encounter: Payer: Self-pay | Admitting: Orthopedic Surgery

## 2019-06-04 NOTE — Patient Instructions (Signed)
Dustin [name]  06/04/2019     @PREFPERIOPPHARMACY @   Your procedure is scheduled on  06/11/2019  Report to [facility] at  [place].M.  Call this number if you have problems the morning of surgery:  [phone]   Remember:  Do not eat or drink after midnight.                        Take these medicines the morning of surgery with A SIP OF WATER  None    Do not wear jewelry, make-up or nail polish.  Do not wear lotions, powders, or perfumes.Please wear deodorant and brush your teeth.  Do not shave 48 hours prior to surgery.  Men may shave face and neck.  Do not bring valuables to the hospital.  [facility] is not responsible for any belongings or valuables.  Contacts, dentures or bridgework may not be worn into surgery.  Leave your suitcase in the car.  After surgery it may be brought to your room.  For patients admitted to the hospital, discharge time will be determined by your treatment team.  Patients discharged the day of surgery will not be allowed to drive home.   Name and phone number of your driver:   family Special instructions:  None  Please read over the following fact sheets that you were given. Anesthesia Post-op Instructions and Care and Recovery After Surgery       Arthroscopic Knee Ligament Repair, Care After This sheet gives you information about how to care for yourself after your procedure. Your health care provider may also give you more specific instructions. If you have problems or questions, contact your health care provider. What can I expect after the procedure? After the procedure, it is common to have:  Pain in your knee.  Bruising and swelling on your knee, calf, and ankle for 3-4 days.  Fatigue. Follow these instructions at home: If you have a brace or immobilizer:  Wear the brace or immobilizer as told by your health care provider. Remove it only as told by your health care provider.  Loosen the splint or immobilizer if your  toes tingle, become numb, or turn cold and blue.  Keep the brace or immobilizer clean. Bathing  Do not take baths, swim, or use a hot tub until your health care provider approves. Ask your health care provider if you can take showers.  Keep your bandage (dressing) dry until your health care provider says that it can be removed. Cover it and your brace or immobilizer with a watertight covering when you take a shower. Incision care   Follow instructions from your health care provider about how to take care of your incision. Make sure you: ? Wash your hands with soap and water before you change your bandage (dressing). If soap and water are not available, use hand sanitizer. ? Change your dressing as told by your health care provider. ? Leave stitches (sutures), skin glue, or adhesive strips in place. These skin closures may need to stay in place for 2 weeks or longer. If adhesive strip edges start to loosen and curl up, you may trim the loose edges. Do not remove adhesive strips completely unless your health care provider tells you to do that.  Check your incision area every day for signs of infection. Check for: ? More redness, swelling, or pain. ? More fluid or blood. ? Warmth. ? Pus or  a bad smell. Managing pain, stiffness, and swelling   If directed, put ice on the affected area. ? If you have a removable brace or immobilizer, remove it as told by your health care provider. ? Put ice in a plastic bag. ? Place a towel between your skin and the bag or between your brace or immobilizer and the bag. ? Leave the ice on for 20 minutes, 2-3 times a day.  Move your toes often to avoid stiffness and to lessen swelling.  Raise (elevate) the injured area above the level of your heart while you are sitting or lying down. Driving  Do not drive until your health care provider approves. If you have a brace or immobilizer on your leg, ask your health care provider when it is safe for you to  drive.  Do not drive or use heavy machinery while taking prescription pain medicine. Activity  Rest as directed. Ask your health care provider what activities are safe for you.  Do physical therapy exercises as told by your health care provider. Physical therapy will help you regain strength and motion in your knee.  Follow instructions from your health care provider about: ? When you may start motion exercises. ? When you may start riding a stationary bike and doing other low-impact activities. ? When you may start to jog and do other high-impact activities. Safety  Do not use the injured limb to support your body weight until your health care provider says that you can. Use crutches as told by your health care provider. General instructions  Do not use any products that contain nicotine or tobacco, such as cigarettes and e-cigarettes. These can delay bone healing. If you need help quitting, ask your health care provider.  To prevent or treat constipation while you are taking prescription pain medicine, your health care provider may recommend that you: ? Drink enough fluid to keep your urine clear or pale yellow. ? Take over-the-counter or prescription medicines. ? Eat foods that are high in fiber, such as fresh fruits and vegetables, whole grains, and beans. ? Limit foods that are high in fat and processed sugars, such as fried and sweet foods.  Take over-the-counter and prescription medicines only as told by your health care provider.  Keep all follow-up visits as told by your health care provider. This is important. Contact a health care provider if:  You have more redness, swelling, or pain around an incision.  You have more fluid or blood coming from an incision.  Your incision feels warm to the touch.  You have a fever.  You have pain or swelling in your knee, and it gets worse.  You have pain that does not get better with medicine. Get help right away if:  You have  trouble breathing.  You have pus or a bad smell coming from an incision.  You have numbness and tingling near the knee joint. Summary  After the procedure, it is common to have knee pain with bruising and swelling on your knee, calf, and ankle.  Icing your knee and raising your leg above the level of your heart will help control the pain and the swelling.  Do physical therapy exercises as told by your health care provider. Physical therapy will help you regain strength and motion in your knee. This information is not intended to replace advice given to you by your health care provider. Make sure you discuss any questions you have with your health care provider. Document Released: 07/24/2013  Document Revised: 09/15/2017 Document Reviewed: 09/27/2016 Elsevier Patient Education  2020 [employer] Anesthesia, Adult, Care After This sheet gives you information about how to care for yourself after your procedure. Your health care provider may also give you more specific instructions. If you have problems or questions, contact your health care provider. What can I expect after the procedure? After the procedure, the following side effects are common:  Pain or discomfort at the IV site.  Nausea.  Vomiting.  Sore throat.  Trouble concentrating.  Feeling cold or chills.  Weak or tired.  Sleepiness and fatigue.  Soreness and body aches. These side effects can affect parts of the body that were not involved in surgery. Follow these instructions at home:  For at least 24 hours after the procedure:  Have a responsible adult stay with you. It is important to have someone help care for you until you are awake and alert.  Rest as needed.  Do not: ? Participate in activities in which you could fall or become injured. ? Drive. ? Use heavy machinery. ? Drink alcohol. ? Take sleeping pills or medicines that cause drowsiness. ? Make important decisions or sign legal  documents. ? Take care of children on your own. Eating and drinking  Follow any instructions from your health care provider about eating or drinking restrictions.  When you feel hungry, start by eating small amounts of foods that are soft and easy to digest (bland), such as toast. Gradually return to your regular diet.  Drink enough fluid to keep your urine pale yellow.  If you vomit, rehydrate by drinking water, juice, or clear broth. General instructions  If you have sleep apnea, surgery and certain medicines can increase your risk for breathing problems. Follow instructions from your health care provider about wearing your sleep device: ? Anytime you are sleeping, including during daytime naps. ? While taking prescription pain medicines, sleeping medicines, or medicines that make you drowsy.  Return to your normal activities as told by your health care provider. Ask your health care provider what activities are safe for you.  Take over-the-counter and prescription medicines only as told by your health care provider.  If you smoke, do not smoke without supervision.  Keep all follow-up visits as told by your health care provider. This is important. Contact a health care provider if:  You have nausea or vomiting that does not get better with medicine.  You cannot eat or drink without vomiting.  You have pain that does not get better with medicine.  You are unable to pass urine.  You develop a skin rash.  You have a fever.  You have redness around your IV site that gets worse. Get help right away if:  You have difficulty breathing.  You have chest pain.  You have blood in your urine or stool, or you vomit blood. Summary  After the procedure, it is common to have a sore throat or nausea. It is also common to feel tired.  Have a responsible adult stay with you for the first 24 hours after general anesthesia. It is important to have someone help care for you until you  are awake and alert.  When you feel hungry, start by eating small amounts of foods that are soft and easy to digest (bland), such as toast. Gradually return to your regular diet.  Drink enough fluid to keep your urine pale yellow.  Return to your normal activities as told by your health care provider.  Ask your health care provider what activities are safe for you. This information is not intended to replace advice given to you by your health care provider. Make sure you discuss any questions you have with your health care provider. Document Released: 01/09/2001 Document Revised: 10/06/2017 Document Reviewed: 05/19/2017 Elsevier Patient Education  2020 [employer]. How to Use Chlorhexidine for Bathing Chlorhexidine gluconate (CHG) is a germ-killing (antiseptic) solution that is used to clean the skin. It can get rid of the bacteria that normally live on the skin and can keep them away for about 24 hours. To clean your skin with CHG, you may be given:  A CHG solution to use in the shower or as part of a sponge bath.  A prepackaged cloth that contains CHG. Cleaning your skin with CHG may help lower the risk for infection:  While you are staying in the intensive care unit of the hospital.  If you have a vascular access, such as a central line, to provide short-term or long-term access to your veins.  If you have a catheter to drain urine from your bladder.  If you are on a ventilator. A ventilator is a machine that helps you breathe by moving air in and out of your lungs.  After surgery. What are the risks? Risks of using CHG include:  A skin reaction.  Hearing loss, if CHG gets in your ears.  Eye injury, if CHG gets in your eyes and is not rinsed out.  The CHG product catching fire. Make sure that you avoid smoking and flames after applying CHG to your skin. Do not use CHG:  If you have a chlorhexidine allergy or have previously reacted to chlorhexidine.  On babies younger  than 7 months of age. How to use CHG solution  Use CHG only as told by your health care provider, and follow the instructions on the label.  Use the full amount of CHG as directed. Usually, this is one bottle. During a shower Follow these steps when using CHG solution during a shower (unless your health care provider gives you different instructions): 1. Start the shower. 2. Use your normal soap and shampoo to wash your face and hair. 3. Turn off the shower or move out of the shower stream. 4. Pour the CHG onto a clean washcloth. Do not use any type of brush or rough-edged sponge. 5. Starting at your neck, lather your body down to your toes. Make sure you follow these instructions: ? If you will be having surgery, pay special attention to the part of your body where you will be having surgery. Scrub this area for at least 1 minute. ? Do not use CHG on your head or face. If the solution gets into your ears or eyes, rinse them well with water. ? Avoid your genital area. ? Avoid any areas of skin that have broken skin, cuts, or scrapes. ? Scrub your back and under your arms. Make sure to wash skin folds. 6. Let the lather sit on your skin for 1-2 minutes or as long as told by your health care provider. 7. Thoroughly rinse your entire body in the shower. Make sure that all body creases and crevices are rinsed well. 8. Dry off with a clean towel. Do not put any substances on your body afterward-such as powder, lotion, or perfume-unless you are told to do so by your health care provider. Only use lotions that are recommended by the manufacturer. 9. Put on clean clothes or pajamas.  10. If it is the night before your surgery, sleep in clean sheets.  During a sponge bath Follow these steps when using CHG solution during a sponge bath (unless your health care provider gives you different instructions): 1. Use your normal soap and shampoo to wash your face and hair. 2. Pour the CHG onto a clean  washcloth. 3. Starting at your neck, lather your body down to your toes. Make sure you follow these instructions: ? If you will be having surgery, pay special attention to the part of your body where you will be having surgery. Scrub this area for at least 1 minute. ? Do not use CHG on your head or face. If the solution gets into your ears or eyes, rinse them well with water. ? Avoid your genital area. ? Avoid any areas of skin that have broken skin, cuts, or scrapes. ? Scrub your back and under your arms. Make sure to wash skin folds. 4. Let the lather sit on your skin for 1-2 minutes or as long as told by your health care provider. 5. Using a different clean, wet washcloth, thoroughly rinse your entire body. Make sure that all body creases and crevices are rinsed well. 6. Dry off with a clean towel. Do not put any substances on your body afterward-such as powder, lotion, or perfume-unless you are told to do so by your health care provider. Only use lotions that are recommended by the manufacturer. 7. Put on clean clothes or pajamas. 8. If it is the night before your surgery, sleep in clean sheets. How to use CHG prepackaged cloths  Only use CHG cloths as told by your health care provider, and follow the instructions on the label.  Use the CHG cloth on clean, dry skin.  Do not use the CHG cloth on your head or face unless your health care provider tells you to.  When washing with the CHG cloth: ? Avoid your genital area. ? Avoid any areas of skin that have broken skin, cuts, or scrapes. Before surgery Follow these steps when using a CHG cloth to clean before surgery (unless your health care provider gives you different instructions): 1. Using the CHG cloth, vigorously scrub the part of your body where you will be having surgery. Scrub using a back-and-forth motion for 3 minutes. The area on your body should be completely wet with CHG when you are done scrubbing. 2. Do not rinse. Discard  the cloth and let the area air-dry. Do not put any substances on the area afterward, such as powder, lotion, or perfume. 3. Put on clean clothes or pajamas. 4. If it is the night before your surgery, sleep in clean sheets.  For general bathing Follow these steps when using CHG cloths for general bathing (unless your health care provider gives you different instructions). 1. Use a separate CHG cloth for each area of your body. Make sure you wash between any folds of skin and between your fingers and toes. Wash your body in the following order, switching to a new cloth after each step: ? The front of your neck, shoulders, and chest. ? Both of your arms, under your arms, and your hands. ? Your stomach and groin area, avoiding the genitals. ? Your right leg and foot. ? Your left leg and foot. ? The back of your neck, your back, and your buttocks. 2. Do not rinse. Discard the cloth and let the area air-dry. Do not put any substances on your body afterward-such as  powder, lotion, or perfume-unless you are told to do so by your health care provider. Only use lotions that are recommended by the manufacturer. 3. Put on clean clothes or pajamas. Contact a health care provider if:  Your skin gets irritated after scrubbing.  You have questions about using your solution or cloth. Get help right away if:  Your eyes become very red or swollen.  Your eyes itch badly.  Your skin itches badly and is red or swollen.  Your hearing changes.  You have trouble seeing.  You have swelling or tingling in your mouth or throat.  You have trouble breathing.  You swallow any chlorhexidine. Summary  Chlorhexidine gluconate (CHG) is a germ-killing (antiseptic) solution that is used to clean the skin. Cleaning your skin with CHG may help to lower your risk for infection.  You may be given CHG to use for bathing. It may be in a bottle or in a prepackaged cloth to use on your skin. Carefully follow your health  care provider's instructions and the instructions on the product label.  Do not use CHG if you have a chlorhexidine allergy.  Contact your health care provider if your skin gets irritated after scrubbing. This information is not intended to replace advice given to you by your health care provider. Make sure you discuss any questions you have with your health care provider. Document Released: 06/27/2012 Document Revised: 12/20/2018 Document Reviewed: 08/31/2017 Elsevier Patient Education  2020 [employer].

## 2019-06-07 ENCOUNTER — Encounter (HOSPITAL_COMMUNITY): Payer: Self-pay

## 2019-06-07 ENCOUNTER — Other Ambulatory Visit: Payer: Self-pay

## 2019-06-07 ENCOUNTER — Encounter (HOSPITAL_COMMUNITY)
Admission: RE | Admit: 2019-06-07 | Discharge: 2019-06-07 | Disposition: A | Discharge status: Home / Self Care | Payer: BC Managed Care – PPO | Source: Ambulatory Visit | Attending: Orthopedic Surgery | Admitting: Orthopedic Surgery

## 2019-06-07 ENCOUNTER — Other Ambulatory Visit (HOSPITAL_COMMUNITY)
Admission: RE | Admit: 2019-06-07 | Discharge: 2019-06-07 | Disposition: A | Discharge status: Home / Self Care | Payer: BC Managed Care – PPO | Source: Ambulatory Visit | Attending: Orthopedic Surgery | Admitting: Orthopedic Surgery

## 2019-06-07 DIAGNOSIS — Z20828 Contact with and (suspected) exposure to other viral communicable diseases: Secondary | ICD-10-CM | POA: Diagnosis not present

## 2019-06-07 DIAGNOSIS — Z01812 Encounter for preprocedural laboratory examination: Secondary | ICD-10-CM | POA: Insufficient documentation

## 2019-06-07 HISTORY — DX: Malignant (primary) neoplasm, unspecified: C80.1

## 2019-06-07 LAB — BASIC METABOLIC PANEL
Anion gap: 10 (ref 5–15)
BUN: 14 mg/dL (ref 8–23)
CO2: 24 mmol/L (ref 22–32)
Calcium: 9 mg/dL (ref 8.9–10.3)
Chloride: 103 mmol/L (ref 98–111)
Creatinine, Ser: 0.8 mg/dL (ref 0.61–1.24)
GFR calc Af Amer: >60 mL/min (ref >60)
GFR calc non Af Amer: >60 mL/min (ref >60)
Glucose, Bld: 114 mg/dL — ABNORMAL HIGH (ref 70–99)
Potassium: 3.4 mmol/L — ABNORMAL LOW (ref 3.5–5.1)
Sodium: 137 mmol/L (ref 135–145)

## 2019-06-07 LAB — CBC WITH DIFFERENTIAL/PLATELET
Abs Immature Granulocytes: 0.02 10*3/uL (ref 0.00–0.07)
Basophils Absolute: 0 10*3/uL (ref 0.0–0.1)
Basophils Relative: 1 %
Eosinophils Absolute: 0.3 10*3/uL (ref 0.0–0.5)
Eosinophils Relative: 4 %
HCT: 45 % (ref 39.0–52.0)
Hemoglobin: 14.6 g/dL (ref 13.0–17.0)
Immature Granulocytes: 0 %
Lymphocytes Relative: 27 %
Lymphs Abs: 1.8 10*3/uL (ref 0.7–4.0)
MCH: 28.4 pg (ref 26.0–34.0)
MCHC: 32.4 g/dL (ref 30.0–36.0)
MCV: 87.5 fL (ref 80.0–100.0)
Monocytes Absolute: 0.7 10*3/uL (ref 0.1–1.0)
Monocytes Relative: 10 %
Neutro Abs: 3.8 10*3/uL (ref 1.7–7.7)
Neutrophils Relative %: 58 %
Platelets: 249 10*3/uL (ref 150–400)
RBC: 5.14 MIL/uL (ref 4.22–5.81)
RDW: 12.5 % (ref 11.5–15.5)
WBC: 6.6 10*3/uL (ref 4.0–10.5)
nRBC: 0 % (ref 0.0–0.2)

## 2019-06-07 LAB — SARS CORONAVIRUS 2 (TAT 6-24 HRS): SARS Coronavirus 2: NEGATIVE

## 2019-06-10 NOTE — H&P (Signed)
Dustin [name]   05/27/2019   HISTORY SECTION :       Chief Complaint  Patient presents with  . Knee Pain      Right knee pain, referred by Dr. Luna Glasgow.   HPI The patient presents for evaluation of right knee pain which he has had for years no trauma he got an injection took some ibuprofen and Aleve did not get better had an MRI which shows a torn medial meniscus and arthritis sort of diffusely about the knee Location medial joint line   Quality dull ache Severity moderate Associated with mechanical symptoms   Review of Systems  All other systems reviewed and are negative.          Family History  Problem Relation Age of Onset  . Colon cancer Neg Hx     Social History         Tobacco Use  . Smoking status: Current Every Day Smoker      Packs/day: 0.50      Years: 40.00      Pack years: 20.00      Types: Cigarettes  . Smokeless tobacco: Never Used  Substance Use Topics  . Alcohol use: Yes      Comment: occasional  . Drug use: No           Past Medical History:  Diagnosis Date  . Arthritis    . Colon polyps    . HTN (hypertension)    . Joint pain            Past Surgical History:  Procedure Laterality Date  . COLONOSCOPY   10/07/2008    [id number] colorectal polyps/Small internal hemorrhoids. multiple simple adenomas.  . COLONOSCOPY N/A 11/26/2013    Procedure: COLONOSCOPY;  Surgeon: Danie Binder, MD;  Location: [facility];  Service: Endoscopy;  Laterality: N/A;  10:15AM  . COLONOSCOPY N/A 04/05/2019    Procedure: COLONOSCOPY;  Surgeon: Danie Binder, MD;  Location: [facility];  Service: Endoscopy;  Laterality: N/A;  8:30  . LYMPH NODE DISSECTION Bilateral 10/01/2014    Procedure: LYMPH NODE DISSECTION;  Surgeon: Malka So, MD;  Location: [facility];  Service: Urology;  Laterality: Bilateral;  . POLYPECTOMY   04/05/2019    Procedure: POLYPECTOMY;  Surgeon: Danie Binder, MD;  Location: [facility];  Service: Endoscopy;;  hepatic flexure,  sigmoid x3  . RECTAL SURGERY   2010    Ziegler: necrotic hemorrhoidal tissue  . ROBOT ASSISTED LAPAROSCOPIC RADICAL PROSTATECTOMY N/A 10/01/2014    Procedure: ROBOTIC ASSISTED LAPAROSCOPIC RADICAL PROSTATECTOMY;  Surgeon: Malka So, MD;  Location: [facility];  Service: Urology;  Laterality: N/A;  . UMBILICAL HERNIA REPAIR N/A 10/01/2014    Procedure: HERNIA REPAIR UMBILICAL ADULT;  Surgeon: Ralene Ok, MD;  Location: [facility];  Service: General;  Laterality: N/A;       No Known Allergies    Current Outpatient Medications:  .  GOODYS EXTRA STRENGTH 500-325-65 MG PACK, Take 1 packet by mouth daily as needed (pain.)., Disp: , Rfl:  .  hydrochlorothiazide (HYDRODIURIL) 12.5 MG tablet, Take 12.5 mg by mouth daily., Disp: , Rfl:  .  ibuprofen (ADVIL) 200 MG tablet, Take 400 mg by mouth 2 (two) times daily as needed (arthritis)., Disp: , Rfl:  .  losartan (COZAAR) 100 MG tablet, Take 100 mg by mouth daily., Disp: , Rfl:  .  naproxen sodium (ALEVE) 220 MG tablet, Take 220 mg by mouth., Disp: , Rfl:  .  Omega-3 Fatty Acids (OMEGA 3 PO), Take 1 capsule by mouth daily., Disp: , Rfl:      PHYSICAL EXAM SECTION: 1) BP 125/73   Pulse 83   Ht 5\' 9"  (1.753 m)   Wt 234 lb (106.1 kg)   BMI 34.56 kg/m   Body mass index is 34.56 kg/m. General appearance: Well-developed well-nourished no gross deformities  2) Cardiovascular normal pulse and perfusion in all 4 extremities normal color without edema  3) Neurologically deep tendon reflexes are equal and normal, no sensation loss or deficits no pathologic reflexes   4) Psychological: Awake alert and oriented x3 mood and affect normal   5) Skin no lacerations or ulcerations no nodularity no palpable masses, no erythema or nodularity   6) Musculoskeletal:    Right knee medial joint line is tender there is no joint effusion and his range of motion seems normal.  Ligaments are stable muscle tone is excellent strength is normal in the knee   MEDICAL  DECISION SECTION:      Encounter Diagnoses  Name Primary?  . Derangement of posterior horn of medial meniscus of right knee Yes  . Primary localized osteoarthritis of knee       Imaging X-ray 4 views of the knee do not really show any arthritis   MRI shows a torn medial meniscus with arthritis in 2 compartments     CLINICAL DATA:  Anterior and medial right knee pain, swelling and tightness. Symptoms are chronic. No known injury.   EXAM: MRI OF THE RIGHT KNEE WITHOUT CONTRAST   TECHNIQUE: Multiplanar, multisequence MR imaging of the knee was performed. No intravenous contrast was administered.   COMPARISON:  Plain films right knee 10/04/2018.   FINDINGS: MENISCI   Medial meniscus: Horizontal tear in the posterior horn extends into the posterior body. A tiny flap of meniscus is displaced along the medial tibia deep to the MCL. The body is degenerated and diminutive with blunting along its free edge throughout.   Lateral meniscus: Intact.   LIGAMENTS   Cruciates:  Intact.   Collaterals:  Intact.   CARTILAGE   Patellofemoral:  Cartilage of the patella is diffusely thinned.   Medial:  Moderately thinned and irregular.   Lateral:  Minimally degenerated.   Joint:  Small effusion.   Popliteal Fossa:  No Baker's cyst.   Extensor Mechanism:  Intact.   Bones: Minimal marrow edema is seen about the periphery of the medial compartment and tiny foci of subchondral edema are seen in the patella at the apex. No fracture or worrisome lesion.   Other: None.   IMPRESSION: Horizontal tear posterior and posterior body of the medial meniscus. The body of the medial meniscus is degenerated with blunting along its free edge throughout.   Mild to moderate osteoarthritis most notable in the medial and patellofemoral compartments.     Electronically Signed   By: Inge Rise M.D.   On: 04/06/2019 21:29   Plan:  (Rx., Inj., surg., Frx, MRI/CT, XR:2)   The  procedure has been fully reviewed with the patient; The risks and benefits of surgery have been discussed and explained and understood. Alternative treatment has also been reviewed, questions were encouraged and answered. The postoperative plan is also been reviewed.     Arthroscopy right knee medial meniscectomy   Patient aware may need total knee in the future   Expect out of work 6 weeks       12:03 PM [name], MD

## 2019-06-11 ENCOUNTER — Other Ambulatory Visit: Payer: Self-pay

## 2019-06-11 ENCOUNTER — Ambulatory Visit (HOSPITAL_COMMUNITY)
Admission: RE | Admit: 2019-06-11 | Discharge: 2019-06-11 | Disposition: A | Discharge status: Home / Self Care | Payer: BC Managed Care – PPO | Attending: Orthopedic Surgery | Admitting: Orthopedic Surgery

## 2019-06-11 ENCOUNTER — Encounter (HOSPITAL_COMMUNITY)
Admission: RE | Disposition: A | Discharge status: Home / Self Care | Payer: Self-pay | Source: Home / Self Care | Attending: Orthopedic Surgery

## 2019-06-11 ENCOUNTER — Ambulatory Visit (HOSPITAL_COMMUNITY): Payer: BC Managed Care – PPO | Admitting: Anesthesiology

## 2019-06-11 ENCOUNTER — Encounter (HOSPITAL_COMMUNITY): Payer: Self-pay | Admitting: Anesthesiology

## 2019-06-11 DIAGNOSIS — F17210 Nicotine dependence, cigarettes, uncomplicated: Secondary | ICD-10-CM | POA: Diagnosis not present

## 2019-06-11 DIAGNOSIS — Z9889 Other specified postprocedural states: Secondary | ICD-10-CM

## 2019-06-11 DIAGNOSIS — M1711 Unilateral primary osteoarthritis, right knee: Secondary | ICD-10-CM | POA: Insufficient documentation

## 2019-06-11 DIAGNOSIS — M23321 Other meniscus derangements, posterior horn of medial meniscus, right knee: Secondary | ICD-10-CM | POA: Diagnosis not present

## 2019-06-11 DIAGNOSIS — M1990 Unspecified osteoarthritis, unspecified site: Secondary | ICD-10-CM | POA: Diagnosis not present

## 2019-06-11 DIAGNOSIS — S83241A Other tear of medial meniscus, current injury, right knee, initial encounter: Secondary | ICD-10-CM | POA: Insufficient documentation

## 2019-06-11 DIAGNOSIS — Z79899 Other long term (current) drug therapy: Secondary | ICD-10-CM | POA: Insufficient documentation

## 2019-06-11 DIAGNOSIS — X58XXXA Exposure to other specified factors, initial encounter: Secondary | ICD-10-CM | POA: Insufficient documentation

## 2019-06-11 DIAGNOSIS — M2241 Chondromalacia patellae, right knee: Secondary | ICD-10-CM | POA: Diagnosis not present

## 2019-06-11 DIAGNOSIS — M23331 Other meniscus derangements, other medial meniscus, right knee: Secondary | ICD-10-CM | POA: Diagnosis not present

## 2019-06-11 DIAGNOSIS — I10 Essential (primary) hypertension: Secondary | ICD-10-CM | POA: Diagnosis not present

## 2019-06-11 DIAGNOSIS — Z791 Long term (current) use of non-steroidal anti-inflammatories (NSAID): Secondary | ICD-10-CM | POA: Insufficient documentation

## 2019-06-11 HISTORY — PX: KNEE ARTHROSCOPY WITH MEDIAL MENISECTOMY: SHX5651

## 2019-06-11 SURGERY — ARTHROSCOPY, KNEE, WITH MEDIAL MENISCECTOMY
Anesthesia: General | Site: Knee | Laterality: Right

## 2019-06-11 MED ORDER — SEVOFLURANE IN SOLN
RESPIRATORY_TRACT | Status: AC
  Filled 2019-06-11: qty 250

## 2019-06-11 MED ORDER — DEXAMETHASONE SODIUM PHOSPHATE 4 MG/ML IJ SOLN
INTRAMUSCULAR | Status: DC | PRN
  Administered 2019-06-11: 10 mg via INTRAVENOUS

## 2019-06-11 MED ORDER — LIDOCAINE 2% (20 MG/ML) 5 ML SYRINGE
INTRAMUSCULAR | Status: DC | PRN
  Administered 2019-06-11: 40 mg via INTRAVENOUS

## 2019-06-11 MED ORDER — HYDROMORPHONE HCL 1 MG/ML IJ SOLN: 0.5000 mg | INTRAMUSCULAR | Status: DC | PRN

## 2019-06-11 MED ORDER — LIDOCAINE 2% (20 MG/ML) 5 ML SYRINGE
INTRAMUSCULAR | Status: AC
  Filled 2019-06-11: qty 5

## 2019-06-11 MED ORDER — PROPOFOL 10 MG/ML IV BOLUS
INTRAVENOUS | Status: AC
  Filled 2019-06-11: qty 40

## 2019-06-11 MED ORDER — GLYCOPYRROLATE PF 0.2 MG/ML IJ SOSY
PREFILLED_SYRINGE | INTRAMUSCULAR | Status: DC | PRN
  Administered 2019-06-11: .2 mg via INTRAVENOUS

## 2019-06-11 MED ORDER — DEXAMETHASONE SODIUM PHOSPHATE 10 MG/ML IJ SOLN
INTRAMUSCULAR | Status: AC
  Filled 2019-06-11: qty 1

## 2019-06-11 MED ORDER — BUPIVACAINE-EPINEPHRINE (PF) 0.5% -1:200000 IJ SOLN
INTRAMUSCULAR | Status: AC
  Filled 2019-06-11: qty 60

## 2019-06-11 MED ORDER — SODIUM CHLORIDE 0.9 % IR SOLN
Status: DC | PRN
  Administered 2019-06-11: 1000 mL

## 2019-06-11 MED ORDER — HYDROCODONE-ACETAMINOPHEN 7.5-325 MG PO TABS: 1.0000 | ORAL_TABLET | ORAL | 0 refills | Status: DC | PRN

## 2019-06-11 MED ORDER — ARTIFICIAL TEARS OPHTHALMIC OINT
TOPICAL_OINTMENT | OPHTHALMIC | Status: AC
  Filled 2019-06-11: qty 3.5

## 2019-06-11 MED ORDER — PROMETHAZINE HCL 12.5 MG PO TABS: 12.5000 mg | ORAL_TABLET | Freq: Four times a day (QID) | ORAL | 0 refills | Status: DC | PRN

## 2019-06-11 MED ORDER — ONDANSETRON HCL 4 MG/2ML IJ SOLN: 4.0000 mg | Freq: Once | INTRAMUSCULAR | Status: DC

## 2019-06-11 MED ORDER — ONDANSETRON HCL 4 MG/2ML IJ SOLN
4.0000 mg | Freq: Once | INTRAMUSCULAR | Status: AC | PRN
  Administered 2019-06-11: 4 mg via INTRAVENOUS
  Filled 2019-06-11: qty 2

## 2019-06-11 MED ORDER — IBUPROFEN 400 MG PO TABS
ORAL_TABLET | ORAL | Status: AC
  Filled 2019-06-11: qty 1

## 2019-06-11 MED ORDER — LACTATED RINGERS IV SOLN
INTRAVENOUS | Status: DC | PRN
  Administered 2019-06-11: 10:00:00 via INTRAVENOUS

## 2019-06-11 MED ORDER — HYDROCODONE-ACETAMINOPHEN 7.5-325 MG PO TABS
1.0000 | ORAL_TABLET | Freq: Once | ORAL | Status: AC
  Administered 2019-06-11: 12:00:00 1 via ORAL

## 2019-06-11 MED ORDER — FENTANYL CITRATE (PF) 100 MCG/2ML IJ SOLN
INTRAMUSCULAR | Status: DC | PRN
  Administered 2019-06-11: 50 ug via INTRAVENOUS
  Administered 2019-06-11 (×4): 25 ug via INTRAVENOUS
  Administered 2019-06-11 (×2): 50 ug via INTRAVENOUS

## 2019-06-11 MED ORDER — GLYCOPYRROLATE PF 0.2 MG/ML IJ SOSY
PREFILLED_SYRINGE | INTRAMUSCULAR | Status: AC
  Filled 2019-06-11: qty 1

## 2019-06-11 MED ORDER — ONDANSETRON HCL 4 MG/2ML IJ SOLN
INTRAMUSCULAR | Status: AC
  Filled 2019-06-11: qty 2

## 2019-06-11 MED ORDER — LACTATED RINGERS IV SOLN
Freq: Once | INTRAVENOUS | Status: AC
  Administered 2019-06-11: 09:00:00 via INTRAVENOUS

## 2019-06-11 MED ORDER — SUCCINYLCHOLINE CHLORIDE 200 MG/10ML IV SOSY
PREFILLED_SYRINGE | INTRAVENOUS | Status: AC
  Filled 2019-06-11: qty 10

## 2019-06-11 MED ORDER — CEFAZOLIN SODIUM-DEXTROSE 2-4 GM/100ML-% IV SOLN
2.0000 g | INTRAVENOUS | Status: AC
  Administered 2019-06-11: 10:00:00 2 g via INTRAVENOUS
  Filled 2019-06-11: qty 100

## 2019-06-11 MED ORDER — ONDANSETRON HCL 4 MG/2ML IJ SOLN
INTRAMUSCULAR | Status: DC | PRN
  Administered 2019-06-11: 4 mg via INTRAVENOUS

## 2019-06-11 MED ORDER — IBUPROFEN 400 MG PO TABS
400.0000 mg | ORAL_TABLET | Freq: Once | ORAL | Status: AC
  Administered 2019-06-11: 400 mg via ORAL

## 2019-06-11 MED ORDER — FENTANYL CITRATE (PF) 250 MCG/5ML IJ SOLN
INTRAMUSCULAR | Status: AC
  Filled 2019-06-11: qty 5

## 2019-06-11 MED ORDER — HYDROCODONE-ACETAMINOPHEN 7.5-325 MG PO TABS
ORAL_TABLET | ORAL | Status: AC
  Filled 2019-06-11: qty 1

## 2019-06-11 MED ORDER — EPINEPHRINE PF 1 MG/ML IJ SOLN
INTRAMUSCULAR | Status: AC
  Filled 2019-06-11: qty 5

## 2019-06-11 MED ORDER — SODIUM CHLORIDE 0.9 % IR SOLN
Status: DC | PRN
  Administered 2019-06-11 (×3): 3000 mL

## 2019-06-11 MED ORDER — BUPIVACAINE-EPINEPHRINE (PF) 0.5% -1:200000 IJ SOLN
INTRAMUSCULAR | Status: DC | PRN
  Administered 2019-06-11: 7 mL
  Administered 2019-06-11: 53 mL

## 2019-06-11 MED ORDER — CHLORHEXIDINE GLUCONATE 4 % EX LIQD: 60.0000 mL | Freq: Once | CUTANEOUS | Status: DC

## 2019-06-11 MED ORDER — PROPOFOL 10 MG/ML IV BOLUS
INTRAVENOUS | Status: DC | PRN
  Administered 2019-06-11: 200 mg via INTRAVENOUS

## 2019-06-11 MED ORDER — MEPERIDINE HCL 50 MG/ML IJ SOLN: 6.2500 mg | INTRAMUSCULAR | Status: DC | PRN

## 2019-06-11 SURGICAL SUPPLY — 47 items
BANDAGE ELASTIC 6 LF NS (GAUZE/BANDAGES/DRESSINGS) ×3 IMPLANT
BLADE AGGRESSIVE PLUS 4.0 (BLADE) ×3 IMPLANT
BLADE SURG SZ11 CARB STEEL (BLADE) ×3 IMPLANT
CHLORAPREP W/TINT 26 (MISCELLANEOUS) ×3 IMPLANT
CLOTH BEACON ORANGE TIMEOUT ST (SAFETY) ×3 IMPLANT
COOLER CRYO IC GRAV AND TUBE (ORTHOPEDIC SUPPLIES) ×3 IMPLANT
COVER WAND RF STERILE (DRAPES) ×3 IMPLANT
CUFF CRYO KNEE18X23 MED (MISCELLANEOUS) ×3 IMPLANT
CUFF TOURN SGL QUICK 34 (TOURNIQUET CUFF) ×2
CUFF TRNQT CYL 34X4.125X (TOURNIQUET CUFF) ×1 IMPLANT
CUTTER TOMCAT 5.0MM (BURR) ×3 IMPLANT
DECANTER SPIKE VIAL GLASS SM (MISCELLANEOUS) ×6 IMPLANT
GAUZE 4X4 16PLY RFD (DISPOSABLE) ×3 IMPLANT
GAUZE SPONGE 4X4 12PLY STRL (GAUZE/BANDAGES/DRESSINGS) ×3 IMPLANT
GAUZE SPONGE 4X4 16PLY XRAY LF (GAUZE/BANDAGES/DRESSINGS) ×3 IMPLANT
GAUZE XEROFORM 5X9 LF (GAUZE/BANDAGES/DRESSINGS) ×3 IMPLANT
GLOVE BIOGEL M 7.0 STRL (GLOVE) ×3 IMPLANT
GLOVE BIOGEL PI IND STRL 7.0 (GLOVE) ×2 IMPLANT
GLOVE BIOGEL PI INDICATOR 7.0 (GLOVE) ×4
GLOVE SKINSENSE NS SZ8.0 LF (GLOVE) ×2
GLOVE SKINSENSE STRL SZ8.0 LF (GLOVE) ×1 IMPLANT
GLOVE SS N UNI LF 8.5 STRL (GLOVE) ×3 IMPLANT
GOWN STRL REUS W/ TWL LRG LVL3 (GOWN DISPOSABLE) ×1 IMPLANT
GOWN STRL REUS W/TWL LRG LVL3 (GOWN DISPOSABLE) ×2
GOWN STRL REUS W/TWL XL LVL3 (GOWN DISPOSABLE) ×3 IMPLANT
IV NS IRRIG 3000ML ARTHROMATIC (IV SOLUTION) ×9 IMPLANT
KIT BLADEGUARD II DBL (SET/KITS/TRAYS/PACK) ×3 IMPLANT
KIT TURNOVER CYSTO (KITS) ×3 IMPLANT
MANIFOLD NEPTUNE II (INSTRUMENTS) ×3 IMPLANT
MARKER SKIN DUAL TIP RULER LAB (MISCELLANEOUS) ×3 IMPLANT
NEEDLE HYPO 18GX1.5 BLUNT FILL (NEEDLE) ×3 IMPLANT
NEEDLE HYPO 21X1.5 SAFETY (NEEDLE) ×3 IMPLANT
NEEDLE SPNL 18GX3.5 QUINCKE PK (NEEDLE) ×3 IMPLANT
NS IRRIG 1000ML POUR BTL (IV SOLUTION) ×3 IMPLANT
PACK ARTHRO LIMB DRAPE STRL (MISCELLANEOUS) ×3 IMPLANT
PAD ABD 5X9 TENDERSORB (GAUZE/BANDAGES/DRESSINGS) ×3 IMPLANT
PAD ARMBOARD 7.5X6 YLW CONV (MISCELLANEOUS) ×3 IMPLANT
PADDING CAST COTTON 6X4 STRL (CAST SUPPLIES) ×3 IMPLANT
PROBE BIPOLAR 50 DEGREE SUCT (MISCELLANEOUS) ×3 IMPLANT
SET ARTHROSCOPY INST (INSTRUMENTS) ×3 IMPLANT
SET BASIN LINEN APH (SET/KITS/TRAYS/PACK) ×3 IMPLANT
SUT ETHILON 3 0 FSL (SUTURE) ×3 IMPLANT
SYR 10ML LL (SYRINGE) ×3 IMPLANT
SYR 30ML LL (SYRINGE) ×3 IMPLANT
TUBE CONNECTING 12'X1/4 (SUCTIONS) ×2
TUBE CONNECTING 12X1/4 (SUCTIONS) ×4 IMPLANT
TUBING ARTHRO INFLOW-ONLY STRL (TUBING) ×3 IMPLANT

## 2019-06-11 NOTE — Discharge Instructions (Signed)
Remove the Cryo/Cuff when you are walking ° °Remove the dressing tomorrow, apply band-aids ° °Start knee exercises by bending the knee 25×3 times a day on the second day after surgery ° °Apply weight to the operative knee and leg as tolerated °

## 2019-06-11 NOTE — Anesthesia Postprocedure Evaluation (Signed)
Anesthesia Post Note Late entry for 1205  Patient: Dustin [name]  Procedure(s) Performed: RIGHT KNEE ARTHROSCOPY WITH PARTIAL MEDIAL MENISECTOMY (Right Knee)  Anesthesia Type: General     Last Vitals:  Vitals:   06/11/19 1216 06/11/19 1218  BP: (P) 118/62 118/67  Pulse: (!) (P) 59 (!) 59  Resp: (P) 16 16  Temp: (P) 36.7 C 36.7 C  SpO2: (P) 96% 96%    Last Pain:  Vitals:   06/11/19 1218  TempSrc:   PainSc: 2                  Wilmer Santillo A

## 2019-06-11 NOTE — Transfer of Care (Signed)
Immediate Anesthesia Transfer of Care Note  Patient: Dustin [name]  Procedure(s) Performed: RIGHT KNEE ARTHROSCOPY WITH PARTIAL MEDIAL MENISECTOMY (Right Knee)  Patient Location: PACU  Anesthesia Type:General  Level of Consciousness: awake, oriented and patient cooperative  Airway & Oxygen Therapy: Patient Spontanous Breathing and Patient connected to face mask oxygen  Post-op Assessment: Report given to RN and Post -op Vital signs reviewed and stable  Post vital signs: Reviewed and stable  Last Vitals:  Vitals Value Taken Time  BP 137/91 06/11/19 1119  Temp    Pulse 71 06/11/19 1122  Resp 15 06/11/19 1122  SpO2 100 % 06/11/19 1122  Vitals shown include unvalidated device data.  Last Pain:  Vitals:   06/11/19 0833  TempSrc: Oral  PainSc: 5       Patients Stated Pain Goal: 5 ([id number])  Complications: No apparent anesthesia complications

## 2019-06-11 NOTE — Interval H&P Note (Signed)
History and Physical Interval Note:  06/11/2019 10:00 AM BP 129/77   Pulse 67   Temp 98.4 F (36.9 C) (Oral)   Resp 17   SpO2 94%   Maretta Los  has presented today for surgery, with the diagnosis of torn medial meniscus right knee.  The various methods of treatment have been discussed with the patient and family. After consideration of risks, benefits and other options for treatment, the patient has consented to  Procedure(s): RIGHT KNEE ARTHROSCOPY WITH MEDIAL MENISECTOMY (Right) as a surgical intervention.  The patient's history has been reviewed, patient examined, no change in status, stable for surgery.  I have reviewed the patient's chart and labs.  Questions were answered to the patient's satisfaction.     [name]

## 2019-06-11 NOTE — Anesthesia Preprocedure Evaluation (Signed)
Anesthesia Evaluation  Patient identified by MRN, date of birth, ID band Patient awake    Reviewed: Allergy & Precautions, NPO status , Patient's Chart, lab work & pertinent test results  Airway Mallampati: III  TM Distance: >3 FB Neck ROM: Full    Dental  (+) Lower Dentures, Upper Dentures   Pulmonary Current Smoker,    Pulmonary exam normal breath sounds clear to auscultation       Cardiovascular Exercise Tolerance: Good hypertension, Pt. on medications Normal cardiovascular exam     Neuro/Psych    GI/Hepatic negative GI ROS, Neg liver ROS,   Endo/Other  negative endocrine ROS  Renal/GU negative Renal ROS     Musculoskeletal  (+) Arthritis , Osteoarthritis,    Abdominal   Peds  Hematology negative hematology ROS (+)   Anesthesia Other Findings   Reproductive/Obstetrics                             Anesthesia Physical Anesthesia Plan  ASA: II  Anesthesia Plan: General   Post-op Pain Management:    Induction: Intravenous  PONV Risk Score and Plan: 2 and Ondansetron and Dexamethasone  Airway Management Planned: LMA  Additional Equipment:   Intra-op Plan:   Post-operative Plan:   Informed Consent: I have reviewed the patients History and Physical, chart, labs and discussed the procedure including the risks, benefits and alternatives for the proposed anesthesia with the patient or authorized representative who has indicated his/her understanding and acceptance.     Dental advisory given  Plan Discussed with: CRNA  Anesthesia Plan Comments:         Anesthesia Quick Evaluation

## 2019-06-11 NOTE — Op Note (Signed)
06/11/2019  11:13 AM  PATIENT:  Dustin [name]  68 y.o. male  PRE-OPERATIVE DIAGNOSIS:  torn medial meniscus right knee  POST-OPERATIVE DIAGNOSIS:  torn medial meniscus right , primary osteoarthritis  PROCEDURE:  Procedure(s): RIGHT KNEE ARTHROSCOPY WITH PARTIAL MEDIAL MENISECTOMY (Right)  SURGEON:  Surgeon(s) and Role:    Carole Civil, MD - Primary  Knee arthroscopy dictation  The patient was identified in the preoperative holding area using 2 approved identification mechanisms. The chart was reviewed and updated. The surgical site was confirmed as right  knee and marked with an indelible marker.  The patient was taken to the operating room for anesthesia. After successful  General  anesthesia, ancef  was used as IV antibiotics.  The patient was placed in the supine position with the (right) the operative extremity in an arthroscopic leg holder and the opposite extremity in a padded leg holder.  The timeout was executed.  A lateral portal was established with an 11 blade and the scope was introduced into the joint. A diagnostic arthroscopy was performed in circumferential manner examining the entire knee joint. A medial portal was established and the diagnostic arthroscopy was repeated using a probe to palpate intra-articular structures as they were encountered.   The operative findings are   Medial 3 out of 4 chondral degeneration medial femoral condyle, torn medial meniscus Lateral normal lateral compartment with small insignificant tear free edge lateral meniscus anterior horn Patellofemoral grade III chondromalacia undersurface patella Notch normal ACL and PCL Multiple areas of loose articular cartilage  The medial meniscus was resected using a duckbill forceps. The meniscal fragments were removed with a motorized shaver. The meniscus was balanced with a combination of a motorized shaver and a 50 ArthroCare wand until a stable rim was obtained.  The anterior horn  free edge tear was resected with a shaver  Large bore arthroscopic shaver was used to remove multiple pieces of loose articular cartilage  The arthroscopic pump was placed on the wash mode and any excess debris was removed from the joint using suction.  60 cc of Marcaine with epinephrine was injected through the arthroscope.  The portals were closed with 3-0 nylon suture.  A sterile bandage, Ace wrap and Cryo/Cuff was placed and the Cryo/Cuff was activated. The patient was taken to the recovery room in stable condition.   PHYSICIAN ASSISTANT:   ASSISTANTS: none   ANESTHESIA:   general  EBL:  5 mL   BLOOD ADMINISTERED:none  DRAINS: none   LOCAL MEDICATIONS USED:  MARCAINE     SPECIMEN:  No Specimen  DISPOSITION OF SPECIMEN:  N/A  COUNTS:  YES  TOURNIQUET:  * Missing tourniquet times found for documented tourniquets in log: [id number] *  DICTATION: .Viviann Spare Dictation  PLAN OF CARE: Discharge to home after PACU  PATIENT DISPOSITION:  PACU - hemodynamically stable.   Delay start of Pharmacological VTE agent (>24hrs) due to surgical blood loss or risk of bleeding: not applicable

## 2019-06-11 NOTE — Brief Op Note (Signed)
06/11/2019  11:13 AM  PATIENT:  Dustin [name]  68 y.o. male  PRE-OPERATIVE DIAGNOSIS:  torn medial meniscus right knee  POST-OPERATIVE DIAGNOSIS:  torn medial meniscus right , primary osteoarthritis  PROCEDURE:  Procedure(s): RIGHT KNEE ARTHROSCOPY WITH PARTIAL MEDIAL MENISECTOMY (Right)  SURGEON:  Surgeon(s) and Role:    Carole Civil, MD - Primary  Knee arthroscopy dictation  The patient was identified in the preoperative holding area using 2 approved identification mechanisms. The chart was reviewed and updated. The surgical site was confirmed as right  knee and marked with an indelible marker.  The patient was taken to the operating room for anesthesia. After successful  General  anesthesia, ancef  was used as IV antibiotics.  The patient was placed in the supine position with the (right) the operative extremity in an arthroscopic leg holder and the opposite extremity in a padded leg holder.  The timeout was executed.  A lateral portal was established with an 11 blade and the scope was introduced into the joint. A diagnostic arthroscopy was performed in circumferential manner examining the entire knee joint. A medial portal was established and the diagnostic arthroscopy was repeated using a probe to palpate intra-articular structures as they were encountered.   The operative findings are   Medial 3 out of 4 chondral degeneration medial femoral condyle, torn medial meniscus Lateral normal lateral compartment with small insignificant tear free edge lateral meniscus anterior horn Patellofemoral grade III chondromalacia undersurface patella Notch normal ACL and PCL Multiple areas of loose articular cartilage  The medial meniscus was resected using a duckbill forceps. The meniscal fragments were removed with a motorized shaver. The meniscus was balanced with a combination of a motorized shaver and a 50 ArthroCare wand until a stable rim was obtained.  The anterior horn  free edge tear was resected with a shaver  Large bore arthroscopic shaver was used to remove multiple pieces of loose articular cartilage  The arthroscopic pump was placed on the wash mode and any excess debris was removed from the joint using suction.  60 cc of Marcaine with epinephrine was injected through the arthroscope.  The portals were closed with 3-0 nylon suture.  A sterile bandage, Ace wrap and Cryo/Cuff was placed and the Cryo/Cuff was activated. The patient was taken to the recovery room in stable condition.   PHYSICIAN ASSISTANT:   ASSISTANTS: none   ANESTHESIA:   general  EBL:  5 mL   BLOOD ADMINISTERED:none  DRAINS: none   LOCAL MEDICATIONS USED:  MARCAINE     SPECIMEN:  No Specimen  DISPOSITION OF SPECIMEN:  N/A  COUNTS:  YES  TOURNIQUET:  * Missing tourniquet times found for documented tourniquets in log: [id number] *  DICTATION: .Viviann Spare Dictation  PLAN OF CARE: Discharge to home after PACU  PATIENT DISPOSITION:  PACU - hemodynamically stable.   Delay start of Pharmacological VTE agent (>24hrs) due to surgical blood loss or risk of bleeding: not applicable

## 2019-06-11 NOTE — Anesthesia Procedure Notes (Signed)
Procedure Name: LMA Insertion Date/Time: 06/11/2019 10:09 AM Performed by: Mallorey Odonell A, CRNA Pre-anesthesia Checklist: Patient identified, Emergency Drugs available, Suction available, Patient being monitored and Timeout performed Patient Re-evaluated:Patient Re-evaluated prior to induction Oxygen Delivery Method: Circle system utilized Preoxygenation: Pre-oxygenation with 100% oxygen Induction Type: IV induction LMA: LMA inserted LMA Size: 5.0 Number of attempts: 1 Placement Confirmation: breath sounds checked- equal and bilateral and positive ETCO2 Tube secured with: Tape Dental Injury: Teeth and Oropharynx as per pre-operative assessment

## 2019-06-12 ENCOUNTER — Encounter (HOSPITAL_COMMUNITY): Payer: Self-pay | Admitting: Orthopedic Surgery

## 2019-06-18 DIAGNOSIS — Z9889 Other specified postprocedural states: Secondary | ICD-10-CM | POA: Insufficient documentation

## 2019-06-19 ENCOUNTER — Other Ambulatory Visit: Payer: Self-pay

## 2019-06-19 ENCOUNTER — Encounter: Payer: Self-pay | Admitting: Orthopedic Surgery

## 2019-06-19 ENCOUNTER — Ambulatory Visit (INDEPENDENT_AMBULATORY_CARE_PROVIDER_SITE_OTHER): Payer: BC Managed Care – PPO | Admitting: Orthopedic Surgery

## 2019-06-19 DIAGNOSIS — Z9889 Other specified postprocedural states: Secondary | ICD-10-CM

## 2019-06-19 MED ORDER — HYDROCODONE-ACETAMINOPHEN 5-325 MG PO TABS: 1.0000 | ORAL_TABLET | Freq: Four times a day (QID) | ORAL | 0 refills | Status: DC | PRN

## 2019-06-19 NOTE — Progress Notes (Signed)
Chief Complaint  Patient presents with  . Routine Post Op    right knee scope 06/11/19    The operative findings are   Medial 3 out of 4 chondral degeneration medial femoral condyle, torn medial meniscus Lateral normal lateral compartment with small insignificant tear free edge lateral meniscus anterior horn Patellofemoral grade III chondromalacia undersurface patella Notch normal ACL and PCL Multiple areas of loose articular cartilage  The patient is bending and straightening the knee normally he is using a cane he would like another prescription for some hydrocodone I reduced the dose and went over the opioid prescribing advising for him  Home exercises advance as tolerated come back in 3 weeks  Encounter Diagnosis  Name Primary?  . S/P right knee arthroscopy 06/11/2019

## 2019-06-19 NOTE — Patient Instructions (Signed)
Home exercises  Ice   norco 5 mg every 6 hrs as needed

## 2019-07-10 ENCOUNTER — Ambulatory Visit (INDEPENDENT_AMBULATORY_CARE_PROVIDER_SITE_OTHER): Payer: BC Managed Care – PPO | Admitting: Orthopedic Surgery

## 2019-07-10 ENCOUNTER — Encounter: Payer: Self-pay | Admitting: Orthopedic Surgery

## 2019-07-10 ENCOUNTER — Other Ambulatory Visit: Payer: Self-pay

## 2019-07-10 VITALS — Temp 97.3°F

## 2019-07-10 DIAGNOSIS — Z9889 Other specified postprocedural states: Secondary | ICD-10-CM

## 2019-07-10 NOTE — Patient Instructions (Signed)
RTW OCT 5   TAKE ALEVE FOR PAIN  USE ICE FOR SWELLING

## 2019-07-10 NOTE — Progress Notes (Signed)
Chief Complaint  Patient presents with  . Post-op Follow-up    06/11/2019 improving after right knee scope had some drainage around 06/24/2019 lasted a few days    68 year old male status post arthroscopy right knee August 25 complains of some soreness.  Took an Aleve about 2 3 days ago seemed to do well with that  He is regained his motion his knee has no effusion he is walking without assistive device or limp  We have advised him to take Aleve and use ice as needed call us if that does not work  He can return to work on October 5

## 2019-08-12 DIAGNOSIS — C61 Malignant neoplasm of prostate: Secondary | ICD-10-CM | POA: Diagnosis not present

## 2019-08-15 DIAGNOSIS — R739 Hyperglycemia, unspecified: Secondary | ICD-10-CM | POA: Diagnosis not present

## 2019-08-15 DIAGNOSIS — F17210 Nicotine dependence, cigarettes, uncomplicated: Secondary | ICD-10-CM | POA: Diagnosis not present

## 2019-08-15 DIAGNOSIS — Z0001 Encounter for general adult medical examination with abnormal findings: Secondary | ICD-10-CM | POA: Diagnosis not present

## 2019-08-15 DIAGNOSIS — I10 Essential (primary) hypertension: Secondary | ICD-10-CM | POA: Diagnosis not present

## 2019-08-15 DIAGNOSIS — Z23 Encounter for immunization: Secondary | ICD-10-CM | POA: Diagnosis not present

## 2019-08-15 DIAGNOSIS — J410 Simple chronic bronchitis: Secondary | ICD-10-CM | POA: Diagnosis not present

## 2019-08-15 DIAGNOSIS — F17200 Nicotine dependence, unspecified, uncomplicated: Secondary | ICD-10-CM | POA: Diagnosis not present

## 2019-08-19 DIAGNOSIS — N393 Stress incontinence (female) (male): Secondary | ICD-10-CM | POA: Diagnosis not present

## 2019-08-19 DIAGNOSIS — N5231 Erectile dysfunction following radical prostatectomy: Secondary | ICD-10-CM | POA: Diagnosis not present

## 2019-08-19 DIAGNOSIS — Z8546 Personal history of malignant neoplasm of prostate: Secondary | ICD-10-CM | POA: Diagnosis not present

## 2019-11-05 DIAGNOSIS — R9721 Rising PSA following treatment for malignant neoplasm of prostate: Secondary | ICD-10-CM | POA: Diagnosis not present

## 2019-11-05 DIAGNOSIS — C61 Malignant neoplasm of prostate: Secondary | ICD-10-CM | POA: Diagnosis not present

## 2019-11-05 DIAGNOSIS — N499 Inflammatory disorder of unspecified male genital organ: Secondary | ICD-10-CM | POA: Diagnosis not present

## 2019-11-19 DIAGNOSIS — Z8546 Personal history of malignant neoplasm of prostate: Secondary | ICD-10-CM | POA: Diagnosis not present

## 2019-11-20 DIAGNOSIS — Z6834 Body mass index (BMI) 34.0-34.9, adult: Secondary | ICD-10-CM | POA: Diagnosis not present

## 2019-11-20 DIAGNOSIS — I10 Essential (primary) hypertension: Secondary | ICD-10-CM | POA: Diagnosis not present

## 2019-11-20 DIAGNOSIS — J410 Simple chronic bronchitis: Secondary | ICD-10-CM | POA: Diagnosis not present

## 2019-11-20 DIAGNOSIS — F17200 Nicotine dependence, unspecified, uncomplicated: Secondary | ICD-10-CM | POA: Diagnosis not present

## 2019-11-25 DIAGNOSIS — Z8546 Personal history of malignant neoplasm of prostate: Secondary | ICD-10-CM | POA: Diagnosis not present

## 2019-11-25 DIAGNOSIS — N5231 Erectile dysfunction following radical prostatectomy: Secondary | ICD-10-CM | POA: Diagnosis not present

## 2019-11-25 DIAGNOSIS — N499 Inflammatory disorder of unspecified male genital organ: Secondary | ICD-10-CM | POA: Diagnosis not present

## 2019-11-25 DIAGNOSIS — N393 Stress incontinence (female) (male): Secondary | ICD-10-CM | POA: Diagnosis not present

## 2020-02-10 DIAGNOSIS — J410 Simple chronic bronchitis: Secondary | ICD-10-CM | POA: Diagnosis not present

## 2020-02-10 DIAGNOSIS — I10 Essential (primary) hypertension: Secondary | ICD-10-CM | POA: Diagnosis not present

## 2020-02-10 DIAGNOSIS — L02212 Cutaneous abscess of back [any part, except buttock]: Secondary | ICD-10-CM | POA: Diagnosis not present

## 2020-02-11 ENCOUNTER — Encounter (HOSPITAL_COMMUNITY): Payer: Self-pay | Admitting: Emergency Medicine

## 2020-02-11 ENCOUNTER — Other Ambulatory Visit: Payer: Self-pay

## 2020-02-11 ENCOUNTER — Emergency Department (HOSPITAL_COMMUNITY)
Admission: EM | Admit: 2020-02-11 | Discharge: 2020-02-11 | Disposition: A | Discharge status: Home / Self Care | Payer: BC Managed Care – PPO | Attending: Emergency Medicine | Admitting: Emergency Medicine

## 2020-02-11 DIAGNOSIS — L0291 Cutaneous abscess, unspecified: Secondary | ICD-10-CM

## 2020-02-11 DIAGNOSIS — L02212 Cutaneous abscess of back [any part, except buttock]: Secondary | ICD-10-CM | POA: Insufficient documentation

## 2020-02-11 DIAGNOSIS — I10 Essential (primary) hypertension: Secondary | ICD-10-CM | POA: Insufficient documentation

## 2020-02-11 DIAGNOSIS — F17210 Nicotine dependence, cigarettes, uncomplicated: Secondary | ICD-10-CM | POA: Insufficient documentation

## 2020-02-11 DIAGNOSIS — Z79899 Other long term (current) drug therapy: Secondary | ICD-10-CM | POA: Insufficient documentation

## 2020-02-11 DIAGNOSIS — L02211 Cutaneous abscess of abdominal wall: Secondary | ICD-10-CM | POA: Diagnosis not present

## 2020-02-11 DIAGNOSIS — R222 Localized swelling, mass and lump, trunk: Secondary | ICD-10-CM | POA: Diagnosis not present

## 2020-02-11 MED ORDER — LIDOCAINE-EPINEPHRINE (PF) 2 %-1:200000 IJ SOLN
10.0000 mL | Freq: Once | INTRAMUSCULAR | Status: AC
  Administered 2020-02-11: 10 mL via INTRADERMAL
  Filled 2020-02-11: qty 10

## 2020-02-11 MED ORDER — SULFAMETHOXAZOLE-TRIMETHOPRIM 800-160 MG PO TABS: 1.0000 | ORAL_TABLET | Freq: Two times a day (BID) | ORAL | 0 refills | Status: AC

## 2020-02-11 MED ORDER — ACETAMINOPHEN 325 MG PO TABS
650.0000 mg | ORAL_TABLET | Freq: Once | ORAL | Status: AC
  Administered 2020-02-11: 650 mg via ORAL
  Filled 2020-02-11: qty 2

## 2020-02-11 MED ORDER — POVIDONE-IODINE 10 % EX SOLN
CUTANEOUS | Status: AC
  Administered 2020-02-11: 2
  Filled 2020-02-11: qty 15

## 2020-02-11 NOTE — ED Provider Notes (Signed)
[facility] EMERGENCY DEPARTMENT Provider Note   CSN: [id number] Arrival date & time: 02/11/20  [id number]     History Chief Complaint  Patient presents with  . Abscess    Dustin [name] is a 69 y.o. male.  Patient is a 69 year old male with past medical history of hypertension presenting for back abscess.  Was seen by PCP yesterday and was told to go to the emergency department.  States that he has had this for years now but for the past 1 and half weeks it has gotten worse.  Denies any drainage to the area.  Denies any fevers.  Does report pain.  Does not report any other abscesses on his back.  He has had a normal appetite with no nausea, vomiting, diarrhea.        Past Medical History:  Diagnosis Date  . Arthritis   . Cancer [facility])    prostate  . Colon polyps   . HTN (hypertension)   . Joint pain     Patient Active Problem List   Diagnosis Date Noted  . S/P right knee arthroscopy 06/11/2019 06/18/2019  . Derangement of posterior horn of medial meniscus of right knee   . Prostate cancer ([place]) 10/01/2014  . Personal history of colonic polyps 11/12/2013    Past Surgical History:  Procedure Laterality Date  . COLONOSCOPY  10/07/2008   [id number] colorectal polyps/Small internal hemorrhoids. multiple simple adenomas.  . COLONOSCOPY N/A 11/26/2013   Procedure: COLONOSCOPY;  Surgeon: Danie Binder, MD;  [facility] END[facility];  Service: Endoscopy;  Laterality: N/A;  10:15AM  . COLONOSCOPY N/A 04/05/2019   Procedure: COLONOSCOPY;  Surgeon: Danie Binder, MD;  [facility];  Service: Endoscopy;  Laterality: N/A;  8:30  . KNEE ARTHROSCOPY WITH MEDIAL MENISECTOMY Right 06/11/2019   Procedure: RIGHT KNEE ARTHROSCOPY WITH PARTIAL MEDIAL MENISECTOMY;  Surgeon: Carole Civil, MD;  Location: [facility] ORS;  Service: Orthopedics;  Laterality: Right;  . LYMPH NODE DISSECTION Bilateral 10/01/2014   Procedure: LYMPH NODE DISSECTION;  Surgeon: Malka So, MD;  Location: [facility]  ORS;  Service: Urology;  Laterality: Bilateral;  . POLYPECTOMY  04/05/2019   Procedure: POLYPECTOMY;  Surgeon: Danie Binder, MD;  [facility];  Service: Endoscopy;;  hepatic flexure, sigmoid x3  . RECTAL SURGERY  2010   Ziegler: necrotic hemorrhoidal tissue  . ROBOT ASSISTED LAPAROSCOPIC RADICAL PROSTATECTOMY N/A 10/01/2014   Procedure: ROBOTIC ASSISTED LAPAROSCOPIC RADICAL PROSTATECTOMY;  Surgeon: Malka So, MD;  [facility]S;  Service: Urology;  Laterality: N/A;  . UMBILICAL HERNIA REPAIR N/A 10/01/2014   Procedure: HERNIA REPAIR UMBILICAL ADULT;  Surgeon: Ralene Ok, MD;  Location: [facility] ORS;  Service: General;  Laterality: N/A;       Family History  Problem Relation Age of Onset  . Colon cancer Neg Hx     Social History   Tobacco Use  . Smoking status: Current Every Day Smoker    Packs/day: 0.50    Years: 40.00    Pack years: 20.00    Types: Cigarettes  . Smokeless tobacco: Never Used  Substance Use Topics  . Alcohol use: Yes    Comment: occasional  . Drug use: No    Home Medications Prior to Admission medications   Medication Sig Start Date End Date Taking? Authorizing Provider  GOODYS EXTRA STRENGTH [phone] MG PACK Take 1 packet by mouth daily as needed (pain.).    [provider]  hydrochlorothiazide (HYDRODIURIL) 12.5 MG tablet Take  12.5 mg by mouth daily. 04/01/19   [provider]  losartan (COZAAR) 100 MG tablet Take 100 mg by mouth daily. 01/02/19   [provider]  naproxen sodium (ALEVE) 220 MG tablet Take 220 mg by mouth daily as needed (pain).     [provider]  sulfamethoxazole-trimethoprim (BACTRIM DS) 800-160 MG tablet Take 1 tablet by mouth 2 (two) times daily for 7 days. 02/11/20 02/18/20  Caroline More, DO    Allergies    Patient has no known allergies.  Review of Systems   Review of Systems  Constitutional: Negative for appetite change and fever.  Gastrointestinal: Negative for  diarrhea, nausea and vomiting.    Physical Exam Updated Vital Signs BP (!) 129/112 (BP Location: Right Arm)   Pulse 63   Temp 97.9 F (36.6 C) (Oral)   Resp 18   Ht 5\' 9"  (1.753 m)   Wt 104.3 kg   SpO2 96%   BMI 33.97 kg/m   Physical Exam Constitutional:      General: He is not in acute distress. HENT:     Head: Normocephalic.     Nose: Nose normal.     Mouth/Throat:     Mouth: Mucous membranes are moist.  Eyes:     Conjunctiva/sclera: Conjunctivae normal.  Cardiovascular:     Rate and Rhythm: Normal rate and regular rhythm.     Heart sounds: No murmur. No friction rub. No gallop.   Pulmonary:     Effort: Pulmonary effort is normal. No respiratory distress.     Breath sounds: Normal breath sounds.  Abdominal:     General: Bowel sounds are normal.     Palpations: Abdomen is soft.     Tenderness: There is no abdominal tenderness.  Musculoskeletal:        General: No tenderness. Normal range of motion.     Cervical back: Normal range of motion.  Skin:    Comments: Large abscess noted throughout right flank 1.5cm cyst noted in upper left side of back  Neurological:     General: No focal deficit present.     Mental Status: He is alert.  Psychiatric:        Mood and Affect: Mood normal.     ED Results / Procedures / Treatments   Labs (all labs ordered are listed, but only abnormal results are displayed) Labs Reviewed - No data to display  EKG None  Radiology No results found.  Procedures Ultrasound ED Soft Tissue  Date/Time: 02/11/2020 8:19 AM Performed by: Caroline More, DO Authorized by: Elnora Morrison, MD   Procedure details:    Indications: localization of abscess     Longitudinal view:  Visualized Location:    Location: lower back     Side:  Right Findings:     abscess present Comments:     Supervised by Dr. Reather Converse   .Marland KitchenIncision and Drainage  Date/Time: 02/11/2020 9:23 AM Performed by: Caroline More, DO Authorized by: Elnora Morrison,  MD   Consent:    Consent obtained:  Verbal   Consent given by:  Patient   Risks discussed:  Bleeding, incomplete drainage, infection and pain   Alternatives discussed:  No treatment Location:    Type:  Abscess   Location:  Trunk   Trunk location:  Back Pre-procedure details:    Skin preparation:  Betadine Anesthesia (see MAR for exact dosages):    Anesthesia method:  Local infiltration   Local anesthetic:  Lidocaine 2% WITH epi Procedure type:  Complexity:  Simple Procedure details:    Incision types:  Stab incision   Incision depth:  Dermal   Scalpel blade:  11   Wound management:  Probed and deloculated   Drainage:  Bloody and purulent   Drainage amount:  Copious   Wound treatment:  Wound left open   Packing materials:  None Post-procedure details:    Patient tolerance of procedure:  Tolerated well, no immediate complications   (including critical care time)  Medications Ordered in ED Medications  acetaminophen (TYLENOL) tablet 650 mg (650 mg Oral Given 02/11/20 0831)  lidocaine-EPINEPHrine (XYLOCAINE W/EPI) 2 %-1:200000 (PF) injection 10 mL (10 mLs Intradermal Given by Other 02/11/20 0841)  povidone-iodine (BETADINE) 10 % external solution (2 application  Given [id number])    ED Course  I have reviewed the triage vital signs and the nursing notes.  Pertinent labs & imaging results that were available during my care of the patient were reviewed by me and considered in my medical decision making (see chart for details).    MDM Rules/Calculators/A&P                      Patient presenting with large fluctuant abscess in his right flank.  Has been present for 1/2 weeks.  Has only started antibiotics yesterday with Keflex.  I anticipate patient will need I&D and likely MRSA coverage.  Will perform bedside ultrasound with attending Dr. Reather Converse to characterize abscess.  Pending ultrasound results can consider I&D in the emergency department and MRSA coverage with either  doxycycline or Bactrim.  08:19 Bedside ultrasound performed with the assistance of Dr. Reather Converse.  2 and half centimeter abscess noted.  We will plan for incision and drainage.  09:24 Incision and drainage with copious purulent drainage.  Patient tolerated procedure well without any complication.  I advised that patient follow-up with PCP in the next few days.  Will discharge patient with Bactrim to be taken for 7 days.  ED return precautions discussed.  Patient discussed with Dr. Reather Converse who also saw patient.  Final Clinical Impression(s) / ED Diagnoses Final diagnoses:  Abscess    Rx / DC Orders ED Discharge Orders         Ordered    sulfamethoxazole-trimethoprim (BACTRIM DS) 800-160 MG tablet  2 times daily     02/11/20 [place], [place], DO 02/11/20 [id number]    Elnora Morrison, MD 02/11/20 1025

## 2020-02-11 NOTE — Discharge Instructions (Addendum)
Please follow up with your PCP either Thursday or Friday.  Please continue to take Bactrim twice a day for 7 days.  You can stop taking Keflex.  If you have any worsening infection please come back to the emergency department.

## 2020-02-11 NOTE — ED Triage Notes (Signed)
Pt here for abscess on back. Was sent from Dr. Legrand Rams.

## 2020-03-27 DIAGNOSIS — Z6834 Body mass index (BMI) 34.0-34.9, adult: Secondary | ICD-10-CM | POA: Diagnosis not present

## 2020-03-27 DIAGNOSIS — F17210 Nicotine dependence, cigarettes, uncomplicated: Secondary | ICD-10-CM | POA: Diagnosis not present

## 2020-03-27 DIAGNOSIS — F17200 Nicotine dependence, unspecified, uncomplicated: Secondary | ICD-10-CM | POA: Diagnosis not present

## 2020-03-27 DIAGNOSIS — J410 Simple chronic bronchitis: Secondary | ICD-10-CM | POA: Diagnosis not present

## 2020-03-27 DIAGNOSIS — I10 Essential (primary) hypertension: Secondary | ICD-10-CM | POA: Diagnosis not present

## 2020-08-07 DIAGNOSIS — F17210 Nicotine dependence, cigarettes, uncomplicated: Secondary | ICD-10-CM | POA: Diagnosis not present

## 2020-08-07 DIAGNOSIS — Z23 Encounter for immunization: Secondary | ICD-10-CM | POA: Diagnosis not present

## 2020-08-07 DIAGNOSIS — Z0001 Encounter for general adult medical examination with abnormal findings: Secondary | ICD-10-CM | POA: Diagnosis not present

## 2020-08-07 DIAGNOSIS — F17200 Nicotine dependence, unspecified, uncomplicated: Secondary | ICD-10-CM | POA: Diagnosis not present

## 2020-08-07 DIAGNOSIS — I10 Essential (primary) hypertension: Secondary | ICD-10-CM | POA: Diagnosis not present

## 2020-08-07 DIAGNOSIS — J410 Simple chronic bronchitis: Secondary | ICD-10-CM | POA: Diagnosis not present

## 2020-11-06 DIAGNOSIS — I10 Essential (primary) hypertension: Secondary | ICD-10-CM | POA: Diagnosis not present

## 2020-11-06 DIAGNOSIS — F17200 Nicotine dependence, unspecified, uncomplicated: Secondary | ICD-10-CM | POA: Diagnosis not present

## 2020-11-06 DIAGNOSIS — Z6834 Body mass index (BMI) 34.0-34.9, adult: Secondary | ICD-10-CM | POA: Diagnosis not present

## 2020-11-06 DIAGNOSIS — J410 Simple chronic bronchitis: Secondary | ICD-10-CM | POA: Diagnosis not present

## 2020-12-10 DIAGNOSIS — C61 Malignant neoplasm of prostate: Secondary | ICD-10-CM | POA: Diagnosis not present

## 2020-12-16 DIAGNOSIS — N393 Stress incontinence (female) (male): Secondary | ICD-10-CM | POA: Diagnosis not present

## 2020-12-16 DIAGNOSIS — N5231 Erectile dysfunction following radical prostatectomy: Secondary | ICD-10-CM | POA: Diagnosis not present

## 2020-12-16 DIAGNOSIS — C61 Malignant neoplasm of prostate: Secondary | ICD-10-CM | POA: Diagnosis not present

## 2020-12-16 DIAGNOSIS — R9721 Rising PSA following treatment for malignant neoplasm of prostate: Secondary | ICD-10-CM | POA: Diagnosis not present

## 2020-12-19 ENCOUNTER — Other Ambulatory Visit: Payer: Self-pay

## 2020-12-19 ENCOUNTER — Encounter: Payer: Self-pay | Admitting: Emergency Medicine

## 2020-12-19 ENCOUNTER — Ambulatory Visit
Admission: EM | Admit: 2020-12-19 | Discharge: 2020-12-19 | Disposition: A | Discharge status: Home / Self Care | Payer: BC Managed Care – PPO | Attending: Emergency Medicine | Admitting: Emergency Medicine

## 2020-12-19 DIAGNOSIS — M25562 Pain in left knee: Secondary | ICD-10-CM

## 2020-12-19 MED ORDER — PREDNISONE 20 MG PO TABS: 20.0000 mg | ORAL_TABLET | Freq: Two times a day (BID) | ORAL | 0 refills | Status: AC

## 2020-12-19 NOTE — ED Provider Notes (Signed)
[place]   [id number] 12/19/20 Arrival Time: 0240  CC: LT knee PAIN  SUBJECTIVE: History from: patient. Dustin [name] is a 70 y.o. male complains of left knee pain x few months, worsening over the past few days.  Denies a precipitating event or specific injury.  Localizes the pain to inside of the knee.  Describes the pain as intermittent and achy in character.  Has tried OTC medications with minimal relief.  Symptoms are made worse with walking.  Reports similar symptoms in the past.  Reports fever, chills, erythema, ecchymosis, effusion, weakness, numbness and tingling.  ROS: As per HPI.  All other pertinent ROS negative.     Past Medical History:  Diagnosis Date  . Arthritis   . Cancer [facility])    prostate  . Colon polyps   . HTN (hypertension)   . Joint pain    Past Surgical History:  Procedure Laterality Date  . COLONOSCOPY  10/07/2008   [id number] colorectal polyps/Small internal hemorrhoids. multiple simple adenomas.  . COLONOSCOPY N/A 11/26/2013   Procedure: COLONOSCOPY;  Surgeon: Danie Binder, MD;  [facility];  Service: Endoscopy;  Laterality: N/A;  10:15AM  . COLONOSCOPY N/A 04/05/2019   Procedure: COLONOSCOPY;  Surgeon: Danie Binder, MD;  [facility];  Service: Endoscopy;  Laterality: N/A;  8:30  . KNEE ARTHROSCOPY WITH MEDIAL MENISECTOMY Right 06/11/2019   Procedure: RIGHT KNEE ARTHROSCOPY WITH PARTIAL MEDIAL MENISECTOMY;  Surgeon: Carole Civil, MD;  Location: [facility] ORS;  Service: Orthopedics;  Laterality: Right;  . LYMPH NODE DISSECTION Bilateral 10/01/2014   Procedure: LYMPH NODE DISSECTION;  Surgeon: Malka So, MD;  [facility];  Service: Urology;  Laterality: Bilateral;  . POLYPECTOMY  04/05/2019   Procedure: POLYPECTOMY;  Surgeon: Danie Binder, MD;  [facility];  Service: Endoscopy;;  hepatic flexure, sigmoid x3  . RECTAL SURGERY  2010   Ziegler: necrotic hemorrhoidal tissue  . ROBOT ASSISTED  LAPAROSCOPIC RADICAL PROSTATECTOMY N/A 10/01/2014   Procedure: ROBOTIC ASSISTED LAPAROSCOPIC RADICAL PROSTATECTOMY;  Surgeon: Malka So, MD;  [facility];  Service: Urology;  Laterality: N/A;  . UMBILICAL HERNIA REPAIR N/A 10/01/2014   Procedure: HERNIA REPAIR UMBILICAL ADULT;  Surgeon: Ralene Ok, MD;  Location[facility];  Service: General;  Laterality: N/A;   No Known Allergies No current facility-administered medications on file prior to encounter.   Current Outpatient Medications on File Prior to Encounter  Medication Sig Dispense Refill  . GOODYS EXTRA STRENGTH 500-325-65 MG PACK Take 1 packet by mouth daily as needed (pain.).    Marland Kitchen hydrochlorothiazide (HYDRODIURIL) 12.5 MG tablet Take 12.5 mg by mouth daily.    Marland Kitchen losartan (COZAAR) 100 MG tablet Take 100 mg by mouth daily.    . naproxen sodium (ALEVE) 220 MG tablet Take 220 mg by mouth daily as needed (pain).      Social History   Socioeconomic History  . Marital status: Married    Spouse name: Not on file  . Number of children: 0  . Years of education: Not on file  . Highest education level: Not on file  Occupational History  . Not on file  Tobacco Use  . Smoking status: Current Every Day Smoker    Packs/day: 0.50    Years: 40.00    Pack years: 20.00    Types: Cigarettes  . Smokeless tobacco: Never Used  Vaping Use  . Vaping Use: Never used  Substance and Sexual Activity  .  Alcohol use: Yes    Comment: occasional  . Drug use: No  . Sexual activity: Yes    Birth control/protection: None  Other Topics Concern  . Not on file  Social History Narrative  . Not on file   Social Determinants of Health   Financial Resource Strain: Not on file  Food Insecurity: Not on file  Transportation Needs: Not on file  Physical Activity: Not on file  Stress: Not on file  Social Connections: Not on file  Intimate Partner Violence: Not on file   Family History  Problem Relation Age of Onset  . Colon cancer Neg Hx      OBJECTIVE:  Vitals:   12/19/20 1240  BP: 125/80  Pulse: 75  Resp: 18  Temp: 97.6 F (36.4 C)  TempSrc: Oral  SpO2: 100%    General appearance: ALERT; in no acute distress.  Head: NCAT Lungs: Normal respiratory effort Musculoskeletal: LT knee Inspection: Skin warm, dry, clear and intact without obvious erythema, effusion, or ecchymosis.  Palpation: TTP over medial aspect of knee ROM: FROM active and passive Strength: 5/5 knee flexion, 5/5 knee extension Stability: - Lachman Skin: warm and dry Neurologic: Ambulates without difficulty; Sensation intact about the lower extremities Psychological: alert and cooperative; normal mood and affect  ASSESSMENT & PLAN:  1. Acute pain of left knee     Meds ordered this encounter  Medications  . predniSONE (DELTASONE) 20 MG tablet    Sig: Take 1 tablet (20 mg total) by mouth 2 (two) times daily with a meal for 5 days.    Dispense:  10 tablet    Refill:  0    Order Specific Question:   Supervising Provider    Answer:   Raylene Everts [[id number]]   Continue conservative management of rest, ice, and gentle stretches Prednisone prescribed.  Take as directed and to completion Follow up with orthopedist for further evaluation  Return or go to the ER if you have any new or worsening symptoms (fever, chills, chest pain, redness, swelling, etc...)   Reviewed expectations re: course of current medical issues. Questions answered. Outlined signs and symptoms indicating need for more acute intervention. Patient verbalized understanding. After Visit Summary given.    Lestine Box, PA-C 12/19/20 1306

## 2020-12-19 NOTE — ED Triage Notes (Signed)
Left knee pain since last night.  Has been taking tylenol arthritis for pain with some relief and then pain comes back.

## 2020-12-19 NOTE — Discharge Instructions (Signed)
Continue conservative management of rest, ice, and gentle stretches Prednisone prescribed.  Take as directed and to completion Follow up with orthopedist for further evaluation  Return or go to the ER if you have any new or worsening symptoms (fever, chills, chest pain, redness, swelling, etc...)

## 2021-02-05 DIAGNOSIS — J410 Simple chronic bronchitis: Secondary | ICD-10-CM | POA: Diagnosis not present

## 2021-02-05 DIAGNOSIS — M13861 Other specified arthritis, right knee: Secondary | ICD-10-CM | POA: Diagnosis not present

## 2021-02-05 DIAGNOSIS — J310 Chronic rhinitis: Secondary | ICD-10-CM | POA: Diagnosis not present

## 2021-02-05 DIAGNOSIS — I10 Essential (primary) hypertension: Secondary | ICD-10-CM | POA: Diagnosis not present

## 2021-04-01 ENCOUNTER — Ambulatory Visit: Payer: BC Managed Care – PPO

## 2021-04-01 ENCOUNTER — Other Ambulatory Visit: Payer: Self-pay

## 2021-04-01 ENCOUNTER — Encounter: Payer: Self-pay | Admitting: Orthopedic Surgery

## 2021-04-01 ENCOUNTER — Ambulatory Visit (INDEPENDENT_AMBULATORY_CARE_PROVIDER_SITE_OTHER): Payer: BC Managed Care – PPO | Admitting: Orthopedic Surgery

## 2021-04-01 VITALS — BP 155/88 | HR 72 | Ht 69.0 in | Wt 215.0 lb

## 2021-04-01 DIAGNOSIS — M170 Bilateral primary osteoarthritis of knee: Secondary | ICD-10-CM | POA: Diagnosis not present

## 2021-04-01 DIAGNOSIS — G8929 Other chronic pain: Secondary | ICD-10-CM

## 2021-04-01 DIAGNOSIS — M25562 Pain in left knee: Secondary | ICD-10-CM

## 2021-04-01 MED ORDER — MELOXICAM 7.5 MG PO TABS: 7.5000 mg | ORAL_TABLET | Freq: Two times a day (BID) | ORAL | 5 refills | Status: DC

## 2021-04-01 NOTE — Progress Notes (Signed)
NEW PROBLEM//OFFICE VISIT  Summary assessment and plan:   Right knee chronic pain s/p SARK med men// OA pain   Acute pain x 3 months left knee probably combo of medial OA and medial meniscus tear   MRI left knee   Start meloxicam 7.5 mg bid   Chief Complaint  Patient presents with   Knee Pain    Left    70 year old male had right knee surgery back in 2020 or 2021 presents now with acute pain left knee after visiting the emergency room with the following history  CC: LT knee PAIN   SUBJECTIVE: History from: patient. Dustin [name] is a 70 y.o. male complains of left knee pain x few months, worsening over the past few days.  Denies a precipitating event or specific injury.  Localizes the pain to inside of the knee.  Describes the pain as intermittent and achy in character.  Has tried OTC medications with minimal relief.  Symptoms are made worse with walking.  Reports similar symptoms in the past.  Reports fever, chills, erythema, ecchymosis, effusion, weakness, numbness and tingling.   ROS: As per HPI.  All other pertinent ROS negative.      He was treated with prednisone    He s still having pain over the medial joint line and surrounding knee joint   MEDICAL DECISION MAKING  A.  Encounter Diagnoses  Name Primary?   Chronic pain of left knee    Primary osteoarthritis of both knees Yes    B. DATA ANALYSED:   IMAGING: Interpretation of images: internal xrays show narrowing of the medial joint line with minimal secondary bone changes   Orders: MRI LEFT KNEE   Outside records reviewed: YES    C. MANAGEMENT   START NSAID MRI LEFT KNEE   Meds ordered this encounter  Medications   meloxicam (MOBIC) 7.5 MG tablet    Sig: Take 1 tablet (7.5 mg total) by mouth 2 (two) times daily.    Dispense:  60 tablet    Refill:  5     BP (!) 155/88   Pulse 72   Ht 5\' 9"  (1.753 m)   Wt 215 lb (97.5 kg)   BMI 31.75 kg/m    General appearance: Well-developed  well-nourished no gross deformities  Cardiovascular normal pulse and perfusion normal color without edema  Neurologically no sensation loss or deficits or pathologic reflexes  Psychological: Awake alert and oriented x3 mood and affect normal  Skin no lacerations or ulcerations no nodularity no palpable masses, no erythema or nodularity  Musculoskeletal:   RIGHT KNEE: TENDER MEDIAL JOINTLINE NO EFFUSION, MILD LOSS OF EXTENSION NORMAL FLEXION. LIGAMENTS NORMAL   LEFT KNEE EFFUSION  MEDIAL JOINT LINE TENDER  [place] LIGAMENTS STABLE    Review of Systems  Constitutional:  Negative for fever.  Eyes:  Negative for blurred vision.  Respiratory:  Negative for cough.   Cardiovascular:  Negative for chest pain.    Past Medical History:  Diagnosis Date   Arthritis    Cancer [facility])    prostate   Colon polyps    HTN (hypertension)    Joint pain     Past Surgical History:  Procedure Laterality Date   COLONOSCOPY  10/07/2008   WEX:[id number] colorectal polyps/Small internal hemorrhoids. multiple simple adenomas.   COLONOSCOPY N/A 11/26/2013   Procedure: COLONOSCOPY;  Surgeon: Danie Binder, MD;  [facility];  Service: Endoscopy;  Laterality: N/A;  10:15AM   COLONOSCOPY N/A 04/05/2019  Procedure: COLONOSCOPY;  Surgeon: Danie Binder, MD;  [facility];  Service: Endoscopy;  Laterality: N/A;  8:30   KNEE ARTHROSCOPY WITH MEDIAL MENISECTOMY Right 06/11/2019   Procedure: RIGHT KNEE ARTHROSCOPY WITH PARTIAL MEDIAL MENISECTOMY;  Surgeon: Carole Civil, MD;  Location: [facility] ORS;  Service: Orthopedics;  Laterality: Right;   LYMPH NODE DISSECTION Bilateral 10/01/2014   Procedure: LYMPH NODE DISSECTION;  Surgeon: Malka So, MD;  [facility];  Service: Urology;  Laterality: Bilateral;   POLYPECTOMY  04/05/2019   Procedure: POLYPECTOMY;  Surgeon: Danie Binder, MD;  [facility];  Service: Endoscopy;;  hepatic flexure, sigmoid x3    RECTAL SURGERY  2010   Ziegler: necrotic hemorrhoidal tissue   ROBOT ASSISTED LAPAROSCOPIC RADICAL PROSTATECTOMY N/A 10/01/2014   Procedure: ROBOTIC ASSISTED LAPAROSCOPIC RADICAL PROSTATECTOMY;  Surgeon: Malka So, MD;  [facility];  Service: Urology;  Laterality: N/A;   UMBILICAL HERNIA REPAIR N/A 10/01/2014   Procedure: HERNIA REPAIR UMBILICAL ADULT;  Surgeon: Ralene Ok, MD;  [facility];  Service: General;  Laterality: N/A;    Family History  Problem Relation Age of Onset   Colon cancer Neg Hx    Social History   Tobacco Use   Smoking status: Every Day    Packs/day: 0.50    Years: 40.00    Pack years: 20.00    Types: Cigarettes   Smokeless tobacco: Never  Vaping Use   Vaping Use: Never used  Substance Use Topics   Alcohol use: Yes    Comment: occasional   Drug use: No    No Known Allergies  Current Meds  Medication Sig   GOODYS EXTRA STRENGTH 500-325-65 MG PACK Take 1 packet by mouth daily as needed (pain.).   hydrochlorothiazide (HYDRODIURIL) 12.5 MG tablet Take 12.5 mg by mouth daily.   losartan (COZAAR) 100 MG tablet Take 100 mg by mouth daily.   meloxicam (MOBIC) 7.5 MG tablet Take 1 tablet (7.5 mg total) by mouth 2 (two) times daily.   naproxen sodium (ALEVE) 220 MG tablet Take 220 mg by mouth daily as needed (pain).         [name], MD  04/01/2021 9:17 AM

## 2021-04-01 NOTE — Patient Instructions (Addendum)
Start new medication   Meds ordered this encounter  Medications   meloxicam (MOBIC) 7.5 MG tablet    Sig: Take 1 tablet (7.5 mg total) by mouth 2 (two) times daily.    Dispense:  60 tablet    Refill:  5   MRI schedule  While we are working on your approval for MRI please go ahead and call to schedule your appointment with Ali Chukson within at least one (1) week.   Central Scheduling [phone]

## 2021-04-16 ENCOUNTER — Other Ambulatory Visit: Payer: Self-pay

## 2021-04-16 ENCOUNTER — Ambulatory Visit (HOSPITAL_COMMUNITY)
Admission: RE | Admit: 2021-04-16 | Discharge: 2021-04-16 | Disposition: A | Discharge status: Home / Self Care | Payer: BC Managed Care – PPO | Source: Ambulatory Visit | Attending: Orthopedic Surgery | Admitting: Orthopedic Surgery

## 2021-04-16 DIAGNOSIS — G8929 Other chronic pain: Secondary | ICD-10-CM | POA: Insufficient documentation

## 2021-04-16 DIAGNOSIS — M25562 Pain in left knee: Secondary | ICD-10-CM | POA: Diagnosis not present

## 2021-04-16 IMAGING — MR MR KNEE*L* W/O CM
7 series · 40 of 40 positions shown · non-contrast
Comparison: X-ray [DATE]

CLINICAL DATA: Chronic left knee pain

EXAM:
MRI OF THE LEFT KNEE WITHOUT CONTRAST
TECHNIQUE: Multiplanar, multisequence MR imaging of the knee was performed. No
intravenous contrast was administered.

[Series 8: T2 fat-sat · axial · left · 4.0mm · 0.50mm/px · z∈[-71,+63]mm · 6 of 28 slices shown (1 of 3)]
[im 1/28]
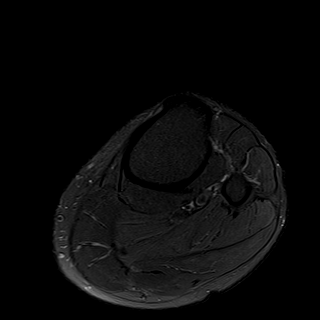
[im 6/28]
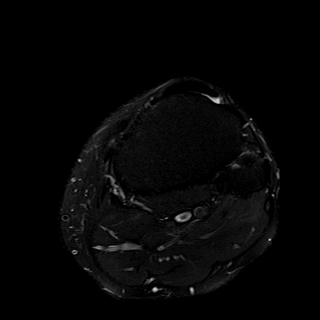
[im 11/28]
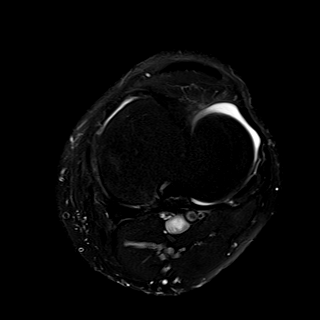
[im 17/28]
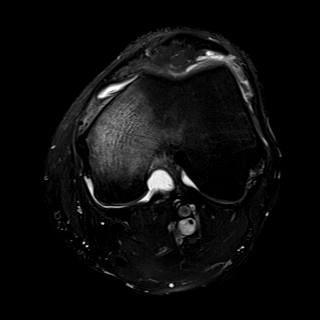
[im 22/28]
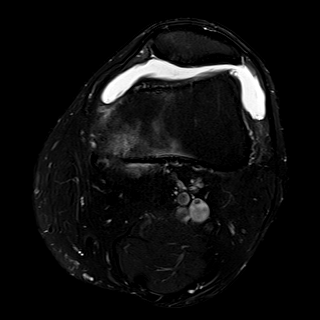
[im 28/28]
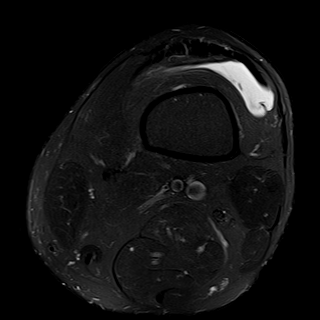

[Series 9: T1 · coronal · left · 4.0mm · 0.55mm/px · 6 of 28 slices shown]
[im 1/28]
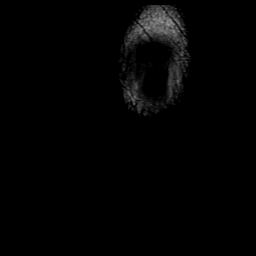
[im 6/28]
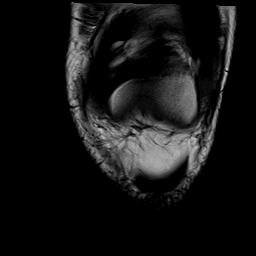
[im 11/28]
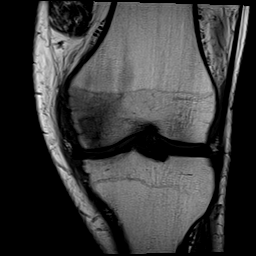
[im 17/28]
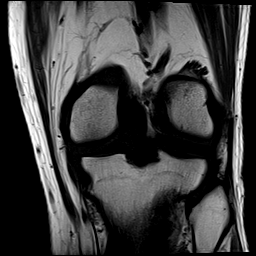
[im 22/28]
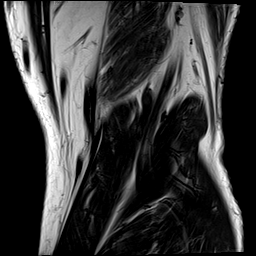
[im 28/28]
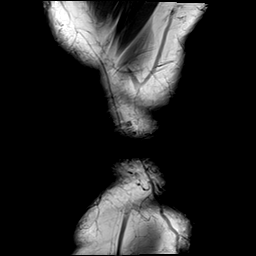

[Series 10: T2 fat-sat · coronal · left · 4.0mm · 0.55mm/px · 6 of 28 slices shown (2 of 3)]
[im 1/28]
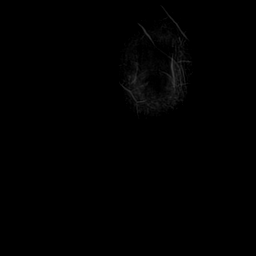
[im 6/28]
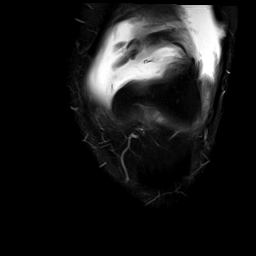
[im 11/28]
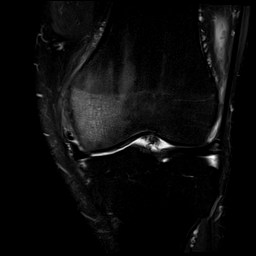
[im 17/28]
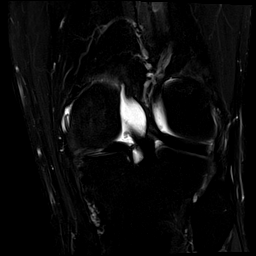
[im 22/28]
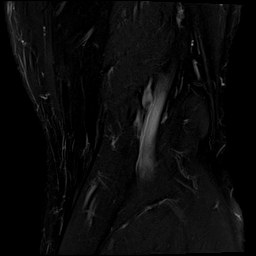
[im 28/28]
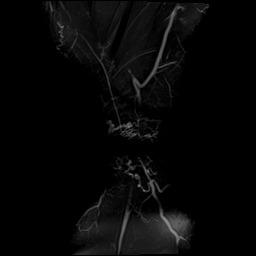

[Series 11: PD fat-sat · coronal · left · 4.0mm · 0.55mm/px · 6 of 28 slices shown (1 of 2)]
[im 1/28]
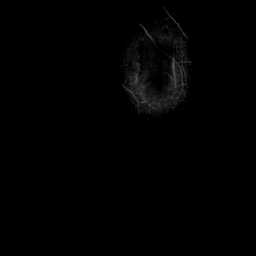
[im 6/28]
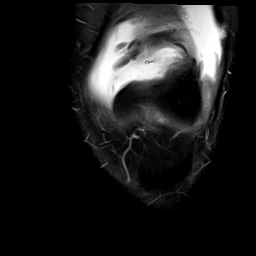
[im 11/28]
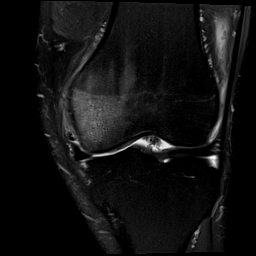
[im 17/28]
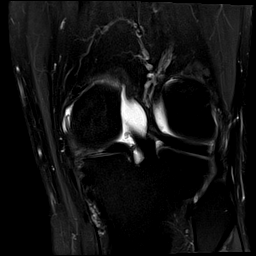
[im 22/28]
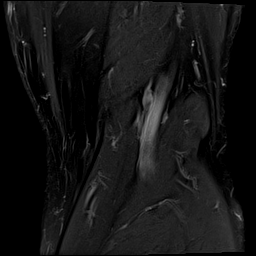
[im 28/28]
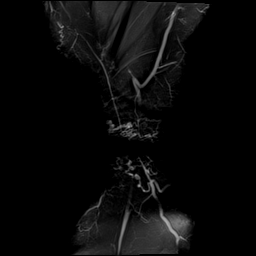

[Series 12: PD fat-sat · sagittal · left · 3.0mm · 0.62mm/px · 6 of 30 slices shown (2 of 2)]
[im 1/30]
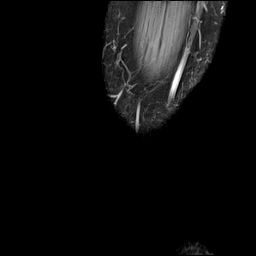
[im 6/30]
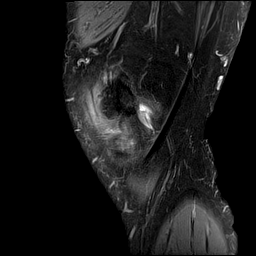
[im 12/30]
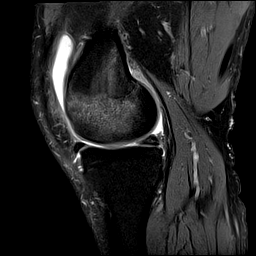
[im 18/30]
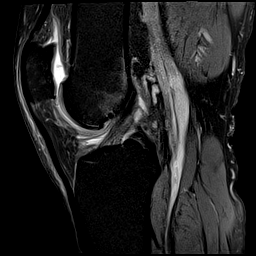
[im 24/30]
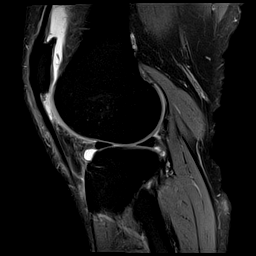
[im 30/30]
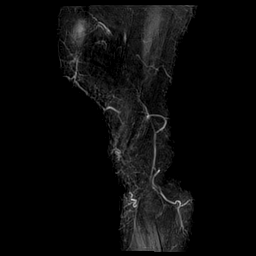

[Series 13: T2 fat-sat · sagittal · left · 3.0mm · 0.62mm/px · 6 of 30 slices shown (3 of 3)]
[im 1/30]
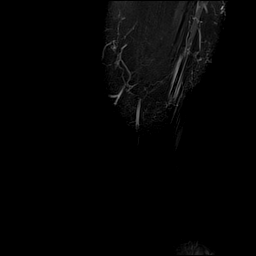
[im 6/30]
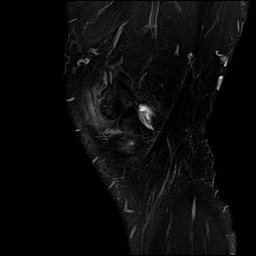
[im 12/30]
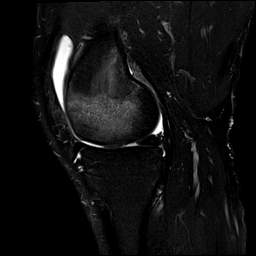
[im 18/30]
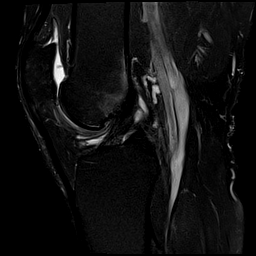
[im 24/30]
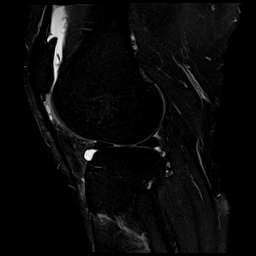
[im 30/30]
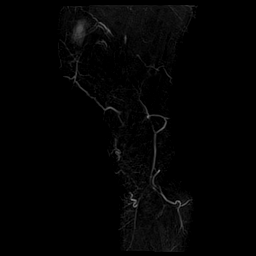

[Series 14: PD · coronal · left · 2.0mm · 0.47mm/px · 4 of 18 slices shown]
[im 1/18]
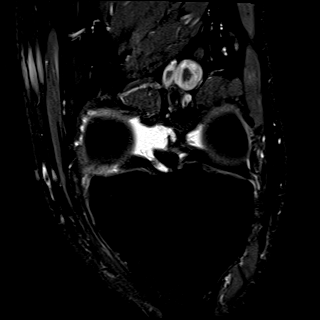
[im 6/18]
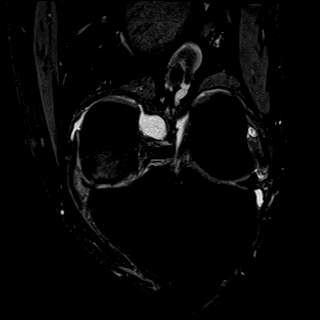
[im 12/18]
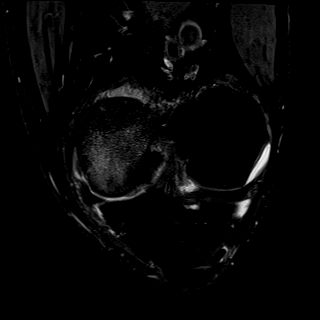
[im 18/18]
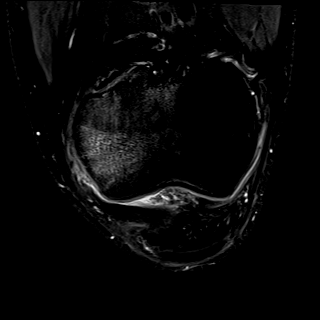

[40 of 40 positions shown; findings below may reference images not displayed]

FINDINGS: MENISCI

Medial meniscus: Extensive complex tearing of the posterior horn and
body segments of the medial meniscus with horizontal-oblique and
radial components. There is a small flap of meniscal tissue
displaced into the inferior gutter.

Lateral meniscus:  Intact.

LIGAMENTS

Cruciates:  Intact ACL and PCL.

Collaterals: Medial collateral ligament is intact. Lateral
collateral ligament complex is intact.

CARTILAGE

Patellofemoral: Mild chondral thinning and surface irregularity of
the lateral greater than medial patellar facets.

Medial: Full-thickness cartilage loss involving the weight-bearing
surfaces of the medial compartment.

Lateral:  No chondral defect.

Joint: Moderate knee joint effusion. Focal edema within the
superolateral aspect of Hoffa's fat.

Popliteal Fossa:  No Baker cyst. Intact popliteus tendon.

Extensor Mechanism: Intact quadriceps tendon and patellar tendon.
Mild proximal patellar tendinosis.

Bones: Small subchondral fracture of the central medial femoral
condyle with extensive bone marrow edema throughout the medial
femoral condyle (series 10, image 15). Remaining osseous structures
are within normal limits. No suspicious bone lesion.

Other: None.
IMPRESSION: 1. Small subchondral fracture of the central medial femoral condyle
with extensive bone marrow edema throughout the medial femoral
condyle. Full-thickness cartilage loss involving the weight-bearing
surfaces of the medial compartment.
2. Extensive complex tearing of the posterior horn and body segments
of the medial meniscus with a small flap of meniscal tissue
displaced into the inferior gutter.
3. Moderate knee joint effusion.
4. Focal edema within the superolateral aspect of Hoffa's fat pad,
which can be seen in the setting of patellar tendon-lateral femoral
condyle friction syndrome.

## 2021-04-20 DIAGNOSIS — L03113 Cellulitis of right upper limb: Secondary | ICD-10-CM | POA: Diagnosis not present

## 2021-04-26 ENCOUNTER — Encounter: Payer: Self-pay | Admitting: Orthopedic Surgery

## 2021-04-26 ENCOUNTER — Ambulatory Visit (INDEPENDENT_AMBULATORY_CARE_PROVIDER_SITE_OTHER): Payer: BC Managed Care – PPO | Admitting: Orthopedic Surgery

## 2021-04-26 ENCOUNTER — Other Ambulatory Visit: Payer: Self-pay

## 2021-04-26 VITALS — BP 133/83 | HR 63 | Ht 69.0 in | Wt 217.4 lb

## 2021-04-26 DIAGNOSIS — M84452D Pathological fracture, left femur, subsequent encounter for fracture with routine healing: Secondary | ICD-10-CM

## 2021-04-26 DIAGNOSIS — M1712 Unilateral primary osteoarthritis, left knee: Secondary | ICD-10-CM | POA: Diagnosis not present

## 2021-04-26 DIAGNOSIS — M23204 Derangement of unspecified medial meniscus due to old tear or injury, left knee: Secondary | ICD-10-CM | POA: Diagnosis not present

## 2021-04-26 DIAGNOSIS — M1710 Unilateral primary osteoarthritis, unspecified knee: Secondary | ICD-10-CM

## 2021-04-26 NOTE — Progress Notes (Signed)
FOLLOW UP   Encounter Diagnoses  Name Primary?   Degenerative tear of medial meniscus of right knee Yes   Insufficiency fracture of right femur with routine healing, subsequent encounter    Primary localized osteoarthritis of knee      Chief Complaint  Patient presents with   Results    MRI results     70 year old male with right knee pain had MRI done here to discuss results and treatment Dr Althia Forts reading:   The MRI shows severe arthritis of the medial compartment subchondral fracture medial femoral condyle torn medial meniscus  Report summary:  IMPRESSION: 1. Small subchondral fracture of the central medial femoral condyle with extensive bone marrow edema throughout the medial femoral condyle. Full-thickness cartilage loss involving the weight-bearing surfaces of the medial compartment. 2. Extensive complex tearing of the posterior horn and body segments of the medial meniscus with a small flap of meniscal tissue displaced into the inferior gutter. 3. Moderate knee joint effusion. 4. Focal edema within the superolateral aspect of Hoffa's fat pad, which can be seen in the setting of patellar tendon-lateral femoral condyle friction syndrome.   Results were reviewed with Mr. Kent and after discussion we have decided to proceed with partial medial meniscectomy left knee followed by 8 weeks of bracing for the subchondral fracture  We have discussed if this is unsuccessful patient will need to have knee replacement  Expect to 3 months.  Out of work after surgery

## 2021-04-26 NOTE — Patient Instructions (Addendum)
Diagnosis arthritis severe right knee, insufficiency fracture right knee, torn meniscus right knee  Out of work 3 months after surgery  Surgery scheduled for July 29  Your surgery will be at [facility] by Dr Aline Brochure  The hospital will contact you with a preoperative appointment to discuss Anesthesia. Please bring your medications with you for the appointment. They will tell you the arrival time and medication instructions when you have your preoperative evaluation. Do not wear nail polish the day of your surgery and if you take Phentermine you need to stop this medication ONE WEEK prior to your surgery.

## 2021-04-28 ENCOUNTER — Encounter: Payer: Self-pay | Admitting: Orthopedic Surgery

## 2021-04-28 ENCOUNTER — Telehealth: Payer: Self-pay | Admitting: Orthopedic Surgery

## 2021-04-28 NOTE — Telephone Encounter (Signed)
Patient question following visit, received via voice message/message left to return call. We have taken care of the appointment note for his follow up/post op visit which is for left knee.

## 2021-05-10 NOTE — Patient Instructions (Addendum)
Dustin [name]  05/10/2021     '@[email or web address]'$ @   Your procedure is scheduled on  05/14/2021.   Report to [facility] at  0700  A.M.   Call this number if you have problems the morning of surgery:  [phone]   Remember:  Do not eat or drink after midnight.      Take these medicines the morning of surgery with A SIP OF WATER            None     Do not wear jewelry, make-up or nail polish.  Do not wear lotions, powders, or perfumes, or deodorant.  Do not shave 48 hours prior to surgery.  Men may shave face and neck.  Do not bring valuables to the hospital.  [facility] is not responsible for any belongings or valuables.  Contacts, dentures or bridgework may not be worn into surgery.  Leave your suitcase in the car.  After surgery it may be brought to your room.  For patients admitted to the hospital, discharge time will be determined by your treatment team.  Patients discharged the day of surgery will not be allowed to drive home and must have someone with them for 24 hours.    Special instructions:    DO NOT smoke tobacco or vape for 24 hours before your procedure.   Please read over the following fact sheets that you were given. Coughing and Deep Breathing, Surgical Site Infection Prevention, Anesthesia Post-op Instructions, and Care and Recovery After Surgery      Arthroscopic Knee Ligament Repair, Care After This sheet gives you information about how to care for yourself after your procedure. Your health care provider may also give you more specific instructions. If you have problems or questions, contact your health careprovider. What can I expect after the procedure? After the procedure, it is common to have: Soreness or pain in your knee. Bruising and swelling on your knee, calf, and ankle for 3-4 days. A small amount of fluid coming from the incisions. Follow these instructions at home: Medicines Take over-the-counter and prescription  medicines only as told by your health care provider. Ask your health care provider if the medicine prescribed to you: Requires you to avoid driving or using machinery. Can cause constipation. You may need to take these actions to prevent or treat constipation: Drink enough fluid to keep your urine pale yellow. Take over-the-counter or prescription medicines. Eat foods that are high in fiber, such as beans, whole grains, and fresh fruits and vegetables. Limit foods that are high in fat and processed sugars, such as fried or sweet foods. If you have a brace or immobilizer: Wear it as told by your health care provider. Remove it only as told by your health care provider. Loosen it if your toes tingle, become numb, or turn cold and blue. Keep it clean and dry. Ask your health care provider when it is safe to drive. Bathing Do not take baths, swim, or use a hot tub until your health care provider approves. Keep your bandage (dressing) dry until your health care provider says that it can be removed. If the brace or immobilizer is not waterproof: Do not let it get wet. Cover it with a watertight covering when you take a bath or shower. Incision care  Follow instructions from your health care provider about how to take care of your incisions. Make sure you: Wash your hands with soap and water for  at least 20 seconds before and after you change your dressing. If soap and water are not available, use hand sanitizer. Change your dressing as told by your health care provider. Leave stitches (sutures), skin glue, or adhesive strips in place. These skin closures may need to stay in place for 2 weeks or longer. If adhesive strip edges start to loosen and curl up, you may trim the loose edges. Do not remove adhesive strips completely unless your health care provider tells you to do that. Check your incision areas every day for signs of infection. Check for: Redness. More swelling or pain. Blood or more  fluid. Warmth. Pus or a bad smell.  Managing pain, stiffness, and swelling  If directed, put ice on the affected area. To do this: If you have a removable brace or immobilizer, remove it as told by your health care provider. Put ice in a plastic bag. Place a towel between your skin and the bag. Leave the ice on for 20 minutes, 2-3 times a day. Remove the ice if your skin turns bright red. This is very important. If you cannot feel pain, heat, or cold, you have a greater risk of damage to the area. Move your toes often to reduce stiffness and swelling. Raise (elevate) the injured area above the level of your heart while you are sitting or lying down.  Activity Do not use your knee to support your body weight until your health care provider says that you can. Use crutches or other devices as told by your health care provider. Do physical therapy exercises as told by your health care provider. Physical therapy will help you regain movement and strength in your knee. Follow instructions from your health care provider about: When you may start motion exercises. When you may start riding a stationary bike and doing other low-impact activities. When you may start to jog and do other high-impact activities. Do not lift anything that is heavier than 10 lb (4.5 kg), or the limit that you are told, until your health care provider says that it is safe. Ask your health care provider what activities are safe for you. General instructions Do not use any products that contain nicotine or tobacco, such as cigarettes, e-cigarettes, and chewing tobacco. These can delay healing. If you need help quitting, ask your health care provider. Wear compression stockings as told by your health care provider. These stockings help to prevent blood clots and reduce swelling in your legs. Keep all follow-up visits. This is important. Contact a health care provider if: You have any of these signs of infection: Redness  around an incision. Blood or more fluid coming from an incision. Warmth coming from an incision. Pus or a bad smell coming from an incision. More swelling or pain in your knee. A fever or chills. You have pain that does not get better with medicine. Your incision opens up. Get help right away if: You have trouble breathing. You have chest pain. You have increased pain or swelling in your calf or at the back of your knee. You have numbness and tingling near the knee joint or in the foot, ankle, or toes. You notice that your foot or toes look darker than normal or are cooler than normal. These symptoms may represent a serious problem that is an emergency. Do not wait to see if the symptoms will go away. Get medical help right away. Call your local emergency services (911 in the [place] Do not drive yourself to the  hospital. Summary After the procedure, it is common to have knee pain with bruising and swelling on your knee, calf, and ankle. Icing your knee and raising your leg above the level of your heart will help control the pain and swelling. Do physical therapy exercises as told by your health care provider. Physical therapy will help you regain movement and strength in your knee. This information is not intended to replace advice given to you by your health care provider. Make sure you discuss any questions you have with your healthcare provider. Document Revised: 03/02/2020 Document Reviewed: 03/02/2020 Elsevier Patient Education  [place] Anesthesia, Adult, Care After This sheet gives you information about how to care for yourself after your procedure. Your health care provider may also give you more specific instructions. If you have problems or questions, contact your health careprovider. What can I expect after the procedure? After the procedure, the following side effects are common: Pain or discomfort at the IV site. Nausea. Vomiting. Sore throat. Trouble  concentrating. Feeling cold or chills. Feeling weak or tired. Sleepiness and fatigue. Soreness and body aches. These side effects can affect parts of the body that were not involved in surgery. Follow these instructions at home: For the time period you were told by your health care provider:  Rest. Do not participate in activities where you could fall or become injured. Do not drive or use machinery. Do not drink alcohol. Do not take sleeping pills or medicines that cause drowsiness. Do not make important decisions or sign legal documents. Do not take care of children on your own.  Eating and drinking Follow any instructions from your health care provider about eating or drinking restrictions. When you feel hungry, start by eating small amounts of foods that are soft and easy to digest (bland), such as toast. Gradually return to your regular diet. Drink enough fluid to keep your urine pale yellow. If you vomit, rehydrate by drinking water, juice, or clear broth. General instructions If you have sleep apnea, surgery and certain medicines can increase your risk for breathing problems. Follow instructions from your health care provider about wearing your sleep device: Anytime you are sleeping, including during daytime naps. While taking prescription pain medicines, sleeping medicines, or medicines that make you drowsy. Have a responsible adult stay with you for the time you are told. It is important to have someone help care for you until you are awake and alert. Return to your normal activities as told by your health care provider. Ask your health care provider what activities are safe for you. Take over-the-counter and prescription medicines only as told by your health care provider. If you smoke, do not smoke without supervision. Keep all follow-up visits as told by your health care provider. This is important. Contact a health care provider if: You have nausea or vomiting that does  not get better with medicine. You cannot eat or drink without vomiting. You have pain that does not get better with medicine. You are unable to pass urine. You develop a skin rash. You have a fever. You have redness around your IV site that gets worse. Get help right away if: You have difficulty breathing. You have chest pain. You have blood in your urine or stool, or you vomit blood. Summary After the procedure, it is common to have a sore throat or nausea. It is also common to feel tired. Have a responsible adult stay with you for the time you are told. It is  important to have someone help care for you until you are awake and alert. When you feel hungry, start by eating small amounts of foods that are soft and easy to digest (bland), such as toast. Gradually return to your regular diet. Drink enough fluid to keep your urine pale yellow. Return to your normal activities as told by your health care provider. Ask your health care provider what activities are safe for you. This information is not intended to replace advice given to you by your health care provider. Make sure you discuss any questions you have with your healthcare provider. Document Revised: 06/18/2020 Document Reviewed: 01/16/2020 Elsevier Patient Education  2022 [place]. How to Use Chlorhexidine for Bathing Chlorhexidine gluconate (CHG) is a germ-killing (antiseptic) solution that is used to clean the skin. It can get rid of the bacteria that normally live on the skin and can keep them away for about 24 hours. To clean your skin with CHG, you may be given: A CHG solution to use in the shower or as part of a sponge bath. A prepackaged cloth that contains CHG. Cleaning your skin with CHG may help lower the risk for infection: While you are staying in the intensive care unit of the hospital. If you have a vascular access, such as a central line, to provide short-term or long-term access to your veins. If you have a  catheter to drain urine from your bladder. If you are on a ventilator. A ventilator is a machine that helps you breathe by moving air in and out of your lungs. After surgery. What are the risks? Risks of using CHG include: A skin reaction. Hearing loss, if CHG gets in your ears. Eye injury, if CHG gets in your eyes and is not rinsed out. The CHG product catching fire. Make sure that you avoid smoking and flames after applying CHG to your skin. Do not use CHG: If you have a chlorhexidine allergy or have previously reacted to chlorhexidine. On babies younger than 50 months of age. How to use CHG solution Use CHG only as told by your health care provider, and follow the instructions on the label. Use the full amount of CHG as directed. Usually, this is one bottle. During a shower Follow these steps when using CHG solution during a shower (unless your health care provider gives you different instructions): Start the shower. Use your normal soap and shampoo to wash your face and hair. Turn off the shower or move out of the shower stream. Pour the CHG onto a clean washcloth. Do not use any type of brush or rough-edged sponge. Starting at your neck, lather your body down to your toes. Make sure you follow these instructions: If you will be having surgery, pay special attention to the part of your body where you will be having surgery. Scrub this area for at least 1 minute. Do not use CHG on your head or face. If the solution gets into your ears or eyes, rinse them well with water. Avoid your genital area. Avoid any areas of skin that have broken skin, cuts, or scrapes. Scrub your back and under your arms. Make sure to wash skin folds. Let the lather sit on your skin for 1-2 minutes or as long as told by your health care provider. Thoroughly rinse your entire body in the shower. Make sure that all body creases and crevices are rinsed well. Dry off with a clean towel. Do not put any substances on  your body afterward--such  as powder, lotion, or perfume--unless you are told to do so by your health care provider. Only use lotions that are recommended by the manufacturer. Put on clean clothes or pajamas. If it is the night before your surgery, sleep in clean sheets.  During a sponge bath Follow these steps when using CHG solution during a sponge bath (unless your health care provider gives you different instructions): Use your normal soap and shampoo to wash your face and hair. Pour the CHG onto a clean washcloth. Starting at your neck, lather your body down to your toes. Make sure you follow these instructions: If you will be having surgery, pay special attention to the part of your body where you will be having surgery. Scrub this area for at least 1 minute. Do not use CHG on your head or face. If the solution gets into your ears or eyes, rinse them well with water. Avoid your genital area. Avoid any areas of skin that have broken skin, cuts, or scrapes. Scrub your back and under your arms. Make sure to wash skin folds. Let the lather sit on your skin for 1-2 minutes or as long as told by your health care provider. Using a different clean, wet washcloth, thoroughly rinse your entire body. Make sure that all body creases and crevices are rinsed well. Dry off with a clean towel. Do not put any substances on your body afterward--such as powder, lotion, or perfume--unless you are told to do so by your health care provider. Only use lotions that are recommended by the manufacturer. Put on clean clothes or pajamas. If it is the night before your surgery, sleep in clean sheets. How to use CHG prepackaged cloths Only use CHG cloths as told by your health care provider, and follow the instructions on the label. Use the CHG cloth on clean, dry skin. Do not use the CHG cloth on your head or face unless your health care provider tells you to. When washing with the CHG cloth: Avoid your genital  area. Avoid any areas of skin that have broken skin, cuts, or scrapes. Before surgery Follow these steps when using a CHG cloth to clean before surgery (unless your health care provider gives you different instructions): Using the CHG cloth, vigorously scrub the part of your body where you will be having surgery. Scrub using a back-and-forth motion for 3 minutes. The area on your body should be completely wet with CHG when you are done scrubbing. Do not rinse. Discard the cloth and let the area air-dry. Do not put any substances on the area afterward, such as powder, lotion, or perfume. Put on clean clothes or pajamas. If it is the night before your surgery, sleep in clean sheets.  For general bathing Follow these steps when using CHG cloths for general bathing (unless your health care provider gives you different instructions). Use a separate CHG cloth for each area of your body. Make sure you wash between any folds of skin and between your fingers and toes. Wash your body in the following order, switching to a new cloth after each step: The front of your neck, shoulders, and chest. Both of your arms, under your arms, and your hands. Your stomach and groin area, avoiding the genitals. Your right leg and foot. Your left leg and foot. The back of your neck, your back, and your buttocks. Do not rinse. Discard the cloth and let the area air-dry. Do not put any substances on your body afterward--such as powder, lotion,  or perfume--unless you are told to do so by your health care provider. Only use lotions that are recommended by the manufacturer. Put on clean clothes or pajamas. Contact a health care provider if: Your skin gets irritated after scrubbing. You have questions about using your solution or cloth. Get help right away if: Your eyes become very red or swollen. Your eyes itch badly. Your skin itches badly and is red or swollen. Your hearing changes. You have trouble seeing. You have  swelling or tingling in your mouth or throat. You have trouble breathing. You swallow any chlorhexidine. Summary Chlorhexidine gluconate (CHG) is a germ-killing (antiseptic) solution that is used to clean the skin. Cleaning your skin with CHG may help to lower your risk for infection. You may be given CHG to use for bathing. It may be in a bottle or in a prepackaged cloth to use on your skin. Carefully follow your health care provider's instructions and the instructions on the product label. Do not use CHG if you have a chlorhexidine allergy. Contact your health care provider if your skin gets irritated after scrubbing. This information is not intended to replace advice given to you by your health care provider. Make sure you discuss any questions you have with your healthcare provider. Document Revised: 02/14/2020 Document Reviewed: 03/20/2020 Elsevier Patient Education  [place].

## 2021-05-11 ENCOUNTER — Encounter (HOSPITAL_COMMUNITY): Payer: Self-pay

## 2021-05-11 ENCOUNTER — Encounter (HOSPITAL_BASED_OUTPATIENT_CLINIC_OR_DEPARTMENT_OTHER)
Admission: RE | Admit: 2021-05-11 | Discharge: 2021-05-11 | Disposition: A | Discharge status: Home / Self Care | Payer: BC Managed Care – PPO | Source: Ambulatory Visit | Attending: Orthopedic Surgery | Admitting: Orthopedic Surgery

## 2021-05-11 ENCOUNTER — Other Ambulatory Visit: Payer: Self-pay

## 2021-05-11 DIAGNOSIS — Z9079 Acquired absence of other genital organ(s): Secondary | ICD-10-CM | POA: Diagnosis not present

## 2021-05-11 DIAGNOSIS — Z01818 Encounter for other preprocedural examination: Secondary | ICD-10-CM | POA: Insufficient documentation

## 2021-05-11 DIAGNOSIS — X58XXXA Exposure to other specified factors, initial encounter: Secondary | ICD-10-CM | POA: Diagnosis not present

## 2021-05-11 DIAGNOSIS — S83232A Complex tear of medial meniscus, current injury, left knee, initial encounter: Secondary | ICD-10-CM | POA: Diagnosis not present

## 2021-05-11 DIAGNOSIS — F17210 Nicotine dependence, cigarettes, uncomplicated: Secondary | ICD-10-CM | POA: Diagnosis not present

## 2021-05-11 DIAGNOSIS — Z8546 Personal history of malignant neoplasm of prostate: Secondary | ICD-10-CM | POA: Diagnosis not present

## 2021-05-11 DIAGNOSIS — Z791 Long term (current) use of non-steroidal anti-inflammatories (NSAID): Secondary | ICD-10-CM | POA: Diagnosis not present

## 2021-05-11 DIAGNOSIS — Z79899 Other long term (current) drug therapy: Secondary | ICD-10-CM | POA: Diagnosis not present

## 2021-05-11 DIAGNOSIS — I10 Essential (primary) hypertension: Secondary | ICD-10-CM | POA: Diagnosis not present

## 2021-05-11 DIAGNOSIS — Z8719 Personal history of other diseases of the digestive system: Secondary | ICD-10-CM | POA: Diagnosis not present

## 2021-05-11 DIAGNOSIS — M1712 Unilateral primary osteoarthritis, left knee: Secondary | ICD-10-CM | POA: Diagnosis not present

## 2021-05-11 LAB — CBC WITH DIFFERENTIAL/PLATELET
Abs Immature Granulocytes: 0.03 10*3/uL (ref 0.00–0.07)
Basophils Absolute: 0 10*3/uL (ref 0.0–0.1)
Basophils Relative: 1 %
Eosinophils Absolute: 0.3 10*3/uL (ref 0.0–0.5)
Eosinophils Relative: 5 %
HCT: 40.7 % (ref 39.0–52.0)
Hemoglobin: 13.4 g/dL (ref 13.0–17.0)
Immature Granulocytes: 1 %
Lymphocytes Relative: 32 %
Lymphs Abs: 2.1 10*3/uL (ref 0.7–4.0)
MCH: 28.9 pg (ref 26.0–34.0)
MCHC: 32.9 g/dL (ref 30.0–36.0)
MCV: 87.9 fL (ref 80.0–100.0)
Monocytes Absolute: 0.7 10*3/uL (ref 0.1–1.0)
Monocytes Relative: 10 %
Neutro Abs: 3.4 10*3/uL (ref 1.7–7.7)
Neutrophils Relative %: 51 %
Platelets: 251 10*3/uL (ref 150–400)
RBC: 4.63 MIL/uL (ref 4.22–5.81)
RDW: 12.5 % (ref 11.5–15.5)
WBC: 6.6 10*3/uL (ref 4.0–10.5)
nRBC: 0 % (ref 0.0–0.2)

## 2021-05-11 LAB — BASIC METABOLIC PANEL
Anion gap: 5 (ref 5–15)
BUN: 16 mg/dL (ref 8–23)
CO2: 27 mmol/L (ref 22–32)
Calcium: 9 mg/dL (ref 8.9–10.3)
Chloride: 103 mmol/L (ref 98–111)
Creatinine, Ser: 0.93 mg/dL (ref 0.61–1.24)
GFR, Estimated: >60 mL/min (ref >60)
Glucose, Bld: 115 mg/dL — ABNORMAL HIGH (ref 70–99)
Potassium: 3.5 mmol/L (ref 3.5–5.1)
Sodium: 135 mmol/L (ref 135–145)

## 2021-05-12 ENCOUNTER — Telehealth: Payer: Self-pay | Admitting: Radiology

## 2021-05-12 DIAGNOSIS — J410 Simple chronic bronchitis: Secondary | ICD-10-CM | POA: Diagnosis not present

## 2021-05-12 DIAGNOSIS — I10 Essential (primary) hypertension: Secondary | ICD-10-CM | POA: Diagnosis not present

## 2021-05-12 DIAGNOSIS — M23204 Derangement of unspecified medial meniscus due to old tear or injury, left knee: Secondary | ICD-10-CM | POA: Diagnosis not present

## 2021-05-12 DIAGNOSIS — M1710 Unilateral primary osteoarthritis, unspecified knee: Secondary | ICD-10-CM | POA: Diagnosis not present

## 2021-05-12 NOTE — Telephone Encounter (Signed)
Authorization not required for the knee scope 29881 CPT code Per automated system [employer] OK the confirmation number is [id number]

## 2021-05-13 NOTE — H&P (Signed)
Chief Complaint  Patient presents with   Knee Pain      Left     70 year old male had right knee surgery back in 2020 or 2021 presents now with acute pain left knee after visiting the emergency room with the following history   CC: LT knee PAIN   SUBJECTIVE: History from: patient. Dustin [name] is a 70 y.o. male complains of left knee pain x few months, worsening over the past few days.  Denies a precipitating event or specific injury.  Localizes the pain to inside of the knee.  Describes the pain as intermittent and achy in character.  Has tried OTC medications with minimal relief.  Symptoms are made worse with walking.  Reports similar symptoms in the past.  Reports fever, chills, erythema, ecchymosis, effusion, weakness, numbness and tingling. He wore a brace to address the subchondral fracture sen on mri with good relief.   He has decided to proceed with arthroscopy to address the media meniscus tear also seen on mri      ROS: As per HPI.  All other pertinent ROS negative.        He was treated with prednisone     He s still having pain over the medial joint line and surrounding knee joint     IMAGING: Interpretation of images: internal xrays show narrowing of the medial joint line with minimal secondary bone changes      BP (!) 155/88   Pulse 72   Ht '5\' 9"'$  (1.753 m)   Wt 215 lb (97.5 kg)   BMI 31.75 kg/m      General appearance: Well-developed well-nourished no gross deformities  Cardiovascular normal pulse and perfusion normal color without edema  Neurologically no sensation loss or deficits or pathologic reflexes   Psychological: Awake alert and oriented x3 mood and affect normal   Skin no lacerations or ulcerations no nodularity no palpable masses, no erythema or nodularity   Musculoskeletal:    RIGHT KNEE: TENDER MEDIAL JOINTLINE NO EFFUSION, MILD LOSS OF EXTENSION NORMAL FLEXION. LIGAMENTS NORMAL   LEFT KNEE EFFUSION MEDIAL JOINT LINE TENDER [place] LIGAMENTS STABLE      Review of Systems Constitutional:  Negative for fever. Eyes:  Negative for blurred vision. Respiratory:  Negative for cough.   Cardiovascular:  Negative for chest pain.          Past Medical History:  Diagnosis Date   Arthritis     Cancer [facility])      prostate   Colon polyps     HTN (hypertension)     Joint pain             Past Surgical History:  Procedure Laterality Date   COLONOSCOPY   10/07/2008    [id number] colorectal polyps/Small internal hemorrhoids. multiple simple adenomas.   COLONOSCOPY N/A 11/26/2013    Procedure: COLONOSCOPY;  Surgeon: Danie Binder, MD;  Location[facility];  Service: Endoscopy;  Laterality: N/A;  10:15AM   COLONOSCOPY N/A 04/05/2019    Procedure: COLONOSCOPY;  Surgeon: Danie Binder, MD;  Location: [facility];  Service: Endoscopy;  Laterality: N/A;  8:30   KNEE ARTHROSCOPY WITH MEDIAL MENISECTOMY Right 06/11/2019    Procedure: RIGHT KNEE ARTHROSCOPY WITH PARTIAL MEDIAL MENISECTOMY;  Surgeon: Carole Civil, MD;  Location: [facility];  Service: Orthopedics;  Laterality: Right;   LYMPH NODE DISSECTION Bilateral 10/01/2014    Procedure: LYMPH NODE DISSECTION;  Surgeon: Malka So, MD;  Location: [facility];  Service: Urology;  Laterality: Bilateral;   POLYPECTOMY   04/05/2019    Procedure: POLYPECTOMY;  Surgeon: Danie Binder, MD;  Location: [facility];  Service: Endoscopy;;  hepatic flexure, sigmoid x3   RECTAL SURGERY   2010    [place]ieg[place]: necrotic hemorrhoidal tissue   ROBOT ASSISTED LAPAROSCOPIC RADICAL PROSTATECTOMY N/A 10/01/2014    Procedure: ROBOTIC ASSISTED LAPAROSCOPIC RADICAL PROSTATECTOMY;  Surgeon: Malka So, MD;  Location: [facility];  Service: Urology;  Laterality: N/A;   UMBILICAL HERNIA REPAIR N/A 10/01/2014    Procedure: HERNIA REPAIR UMBILICAL ADULT;  Surgeon: Ralene Ok, MD;  Location: [facility];  Service: General;  Laterality: N/A;           Family History  Problem Relation  Age of Onset   Colon cancer Neg Hx      Social History         Tobacco Use   Smoking status: Every Day      Packs/day: 0.50      Years: 40.00      Pack years: 20.00      Types: Cigarettes   Smokeless tobacco: Never  Vaping Use   Vaping Use: Never used  Substance Use Topics   Alcohol use: Yes      Comment: occasional   Drug use: No      No Known Allergies       Current Meds  Medication Sig   GOODYS EXTRA STRENGTH 500-325-65 MG PACK Take 1 packet by mouth daily as needed (pain.).   hydrochlorothiazide (HYDRODIURIL) 12.5 MG tablet Take 12.5 mg by mouth daily.   losartan (COZAAR) 100 MG tablet Take 100 mg by mouth daily.   meloxicam (MOBIC) 7.5 MG tablet Take 1 tablet (7.5 mg total) by mouth 2 (two) times daily.   naproxen sodium (ALEVE) 220 MG tablet Take 220 mg by mouth daily as needed (pain).         IMPRESSION: 1. Small subchondral fracture of the central medial femoral condyle with extensive bone marrow edema throughout the medial femoral condyle. Full-thickness cartilage loss involving the weight-bearing surfaces of the medial compartment. 2. Extensive complex tearing of the posterior horn and body segments of the medial meniscus with a small flap of meniscal tissue displaced into the inferior gutter. 3. Moderate knee joint effusion. 4. Focal edema within the superolateral aspect of Hoffa's fat pad, which can be seen in the setting of patellar tendon-lateral femoral condyle friction syndrome.     Electronically Signed   By: Davina Poke D.O.   On: 04/16/2021 14:32    DX  OA left knee Medial meniscus tear left knee  Subchondral fracture left knee   Plan Surgical arthroscopy left knee with medial meniscus resection        [name], MD

## 2021-05-14 ENCOUNTER — Ambulatory Visit (HOSPITAL_COMMUNITY)
Admission: RE | Admit: 2021-05-14 | Discharge: 2021-05-14 | Disposition: A | Discharge status: Home / Self Care | Payer: BC Managed Care – PPO | Attending: Orthopedic Surgery | Admitting: Orthopedic Surgery

## 2021-05-14 ENCOUNTER — Ambulatory Visit (HOSPITAL_COMMUNITY): Payer: BC Managed Care – PPO | Admitting: Certified Registered"

## 2021-05-14 ENCOUNTER — Encounter: Payer: Self-pay | Admitting: Orthopedic Surgery

## 2021-05-14 ENCOUNTER — Encounter (HOSPITAL_COMMUNITY): Payer: Self-pay | Admitting: Orthopedic Surgery

## 2021-05-14 ENCOUNTER — Encounter (HOSPITAL_COMMUNITY)
Admission: RE | Disposition: A | Discharge status: Home / Self Care | Payer: Self-pay | Source: Home / Self Care | Attending: Orthopedic Surgery

## 2021-05-14 ENCOUNTER — Other Ambulatory Visit: Payer: Self-pay

## 2021-05-14 DIAGNOSIS — Z8719 Personal history of other diseases of the digestive system: Secondary | ICD-10-CM | POA: Diagnosis not present

## 2021-05-14 DIAGNOSIS — Z79899 Other long term (current) drug therapy: Secondary | ICD-10-CM | POA: Insufficient documentation

## 2021-05-14 DIAGNOSIS — S83232A Complex tear of medial meniscus, current injury, left knee, initial encounter: Secondary | ICD-10-CM | POA: Insufficient documentation

## 2021-05-14 DIAGNOSIS — M1712 Unilateral primary osteoarthritis, left knee: Secondary | ICD-10-CM | POA: Diagnosis not present

## 2021-05-14 DIAGNOSIS — S83242A Other tear of medial meniscus, current injury, left knee, initial encounter: Secondary | ICD-10-CM | POA: Diagnosis not present

## 2021-05-14 DIAGNOSIS — M23322 Other meniscus derangements, posterior horn of medial meniscus, left knee: Secondary | ICD-10-CM | POA: Diagnosis not present

## 2021-05-14 DIAGNOSIS — I10 Essential (primary) hypertension: Secondary | ICD-10-CM | POA: Diagnosis not present

## 2021-05-14 DIAGNOSIS — Z791 Long term (current) use of non-steroidal anti-inflammatories (NSAID): Secondary | ICD-10-CM | POA: Insufficient documentation

## 2021-05-14 DIAGNOSIS — X58XXXA Exposure to other specified factors, initial encounter: Secondary | ICD-10-CM | POA: Insufficient documentation

## 2021-05-14 DIAGNOSIS — Z9079 Acquired absence of other genital organ(s): Secondary | ICD-10-CM | POA: Diagnosis not present

## 2021-05-14 DIAGNOSIS — Z8546 Personal history of malignant neoplasm of prostate: Secondary | ICD-10-CM | POA: Insufficient documentation

## 2021-05-14 DIAGNOSIS — F17210 Nicotine dependence, cigarettes, uncomplicated: Secondary | ICD-10-CM | POA: Diagnosis not present

## 2021-05-14 HISTORY — PX: KNEE ARTHROSCOPY WITH MEDIAL MENISECTOMY: SHX5651

## 2021-05-14 SURGERY — ARTHROSCOPY, KNEE, WITH MEDIAL MENISCECTOMY
Anesthesia: General | Site: Knee | Laterality: Left

## 2021-05-14 MED ORDER — PROPOFOL 10 MG/ML IV BOLUS
INTRAVENOUS | Status: DC | PRN
  Administered 2021-05-14: 200 mg via INTRAVENOUS

## 2021-05-14 MED ORDER — ACETAMINOPHEN 500 MG PO TABS
500.0000 mg | ORAL_TABLET | Freq: Once | ORAL | Status: AC
  Administered 2021-05-14: 500 mg via ORAL

## 2021-05-14 MED ORDER — EPINEPHRINE PF 1 MG/ML IJ SOLN
INTRAMUSCULAR | Status: AC
  Filled 2021-05-14: qty 5

## 2021-05-14 MED ORDER — FENTANYL CITRATE (PF) 100 MCG/2ML IJ SOLN: 25.0000 ug | INTRAMUSCULAR | Status: DC | PRN

## 2021-05-14 MED ORDER — HYDROCODONE-ACETAMINOPHEN 5-325 MG PO TABS
ORAL_TABLET | ORAL | Status: AC
  Filled 2021-05-14: qty 1

## 2021-05-14 MED ORDER — PROPOFOL 10 MG/ML IV BOLUS
INTRAVENOUS | Status: AC
  Filled 2021-05-14: qty 20

## 2021-05-14 MED ORDER — PHENYLEPHRINE 40 MCG/ML (10ML) SYRINGE FOR IV PUSH (FOR BLOOD PRESSURE SUPPORT)
PREFILLED_SYRINGE | INTRAVENOUS | Status: DC | PRN
  Administered 2021-05-14 (×2): 80 ug via INTRAVENOUS

## 2021-05-14 MED ORDER — DEXAMETHASONE SODIUM PHOSPHATE 4 MG/ML IJ SOLN
INTRAMUSCULAR | Status: DC | PRN
  Administered 2021-05-14: 4 mg via INTRAVENOUS

## 2021-05-14 MED ORDER — BUPIVACAINE-EPINEPHRINE (PF) 0.5% -1:200000 IJ SOLN
INTRAMUSCULAR | Status: DC | PRN
  Administered 2021-05-14: 5 mL
  Administered 2021-05-14: 55 mL

## 2021-05-14 MED ORDER — ACETAMINOPHEN 500 MG PO TABS
ORAL_TABLET | ORAL | Status: AC
  Filled 2021-05-14: qty 1

## 2021-05-14 MED ORDER — GLYCOPYRROLATE PF 0.2 MG/ML IJ SOSY
PREFILLED_SYRINGE | INTRAMUSCULAR | Status: AC
  Filled 2021-05-14: qty 1

## 2021-05-14 MED ORDER — FENTANYL CITRATE (PF) 100 MCG/2ML IJ SOLN
INTRAMUSCULAR | Status: AC
  Filled 2021-05-14: qty 2

## 2021-05-14 MED ORDER — 0.9 % SODIUM CHLORIDE (POUR BTL) OPTIME
TOPICAL | Status: DC | PRN
  Administered 2021-05-14: 1000 mL

## 2021-05-14 MED ORDER — KETOROLAC TROMETHAMINE 30 MG/ML IJ SOLN
INTRAMUSCULAR | Status: DC | PRN
  Administered 2021-05-14: 30 mg via INTRAVENOUS

## 2021-05-14 MED ORDER — ONDANSETRON HCL 4 MG/2ML IJ SOLN
INTRAMUSCULAR | Status: AC
  Filled 2021-05-14: qty 2

## 2021-05-14 MED ORDER — HYDROCODONE-ACETAMINOPHEN 5-325 MG PO TABS: 1.0000 | ORAL_TABLET | ORAL | 0 refills | Status: DC | PRN

## 2021-05-14 MED ORDER — LACTATED RINGERS IV SOLN: INTRAVENOUS | Status: DC

## 2021-05-14 MED ORDER — SODIUM CHLORIDE 0.9 % IR SOLN
Status: DC | PRN
  Administered 2021-05-14 (×3): 3000 mL

## 2021-05-14 MED ORDER — CEFAZOLIN SODIUM-DEXTROSE 2-4 GM/100ML-% IV SOLN
2.0000 g | INTRAVENOUS | Status: AC
  Administered 2021-05-14: 2 g via INTRAVENOUS
  Filled 2021-05-14: qty 100

## 2021-05-14 MED ORDER — LACTATED RINGERS IV SOLN: INTRAVENOUS | Status: DC | PRN

## 2021-05-14 MED ORDER — LIDOCAINE 2% (20 MG/ML) 5 ML SYRINGE
INTRAMUSCULAR | Status: DC | PRN
  Administered 2021-05-14: 100 mg via INTRAVENOUS

## 2021-05-14 MED ORDER — GLYCOPYRROLATE PF 0.2 MG/ML IJ SOSY
PREFILLED_SYRINGE | INTRAMUSCULAR | Status: DC | PRN
  Administered 2021-05-14: .1 mg via INTRAVENOUS

## 2021-05-14 MED ORDER — MIDAZOLAM HCL 5 MG/5ML IJ SOLN
INTRAMUSCULAR | Status: DC | PRN
  Administered 2021-05-14 (×2): 1 mg via INTRAVENOUS

## 2021-05-14 MED ORDER — CHLORHEXIDINE GLUCONATE 0.12 % MT SOLN: 15.0000 mL | Freq: Once | OROMUCOSAL | Status: DC

## 2021-05-14 MED ORDER — KETOROLAC TROMETHAMINE 30 MG/ML IJ SOLN
INTRAMUSCULAR | Status: AC
  Filled 2021-05-14: qty 1

## 2021-05-14 MED ORDER — ORAL CARE MOUTH RINSE: 15.0000 mL | Freq: Once | OROMUCOSAL | Status: DC

## 2021-05-14 MED ORDER — MIDAZOLAM HCL 2 MG/2ML IJ SOLN
INTRAMUSCULAR | Status: AC
  Filled 2021-05-14: qty 2

## 2021-05-14 MED ORDER — HYDROCODONE-ACETAMINOPHEN 5-325 MG PO TABS
1.0000 | ORAL_TABLET | Freq: Once | ORAL | Status: AC
  Administered 2021-05-14: 1 via ORAL

## 2021-05-14 MED ORDER — ONDANSETRON HCL 4 MG/2ML IJ SOLN
INTRAMUSCULAR | Status: DC | PRN
  Administered 2021-05-14: 4 mg via INTRAVENOUS

## 2021-05-14 MED ORDER — FENTANYL CITRATE (PF) 100 MCG/2ML IJ SOLN
INTRAMUSCULAR | Status: DC | PRN
  Administered 2021-05-14: 50 ug via INTRAVENOUS
  Administered 2021-05-14 (×2): 25 ug via INTRAVENOUS
  Administered 2021-05-14 (×2): 50 ug via INTRAVENOUS

## 2021-05-14 MED ORDER — LIDOCAINE HCL (PF) 2 % IJ SOLN
INTRAMUSCULAR | Status: AC
  Filled 2021-05-14: qty 5

## 2021-05-14 MED ORDER — ONDANSETRON HCL 4 MG/2ML IJ SOLN: 4.0000 mg | Freq: Once | INTRAMUSCULAR | Status: DC

## 2021-05-14 MED ORDER — PROMETHAZINE HCL 12.5 MG PO TABS: 12.5000 mg | ORAL_TABLET | Freq: Four times a day (QID) | ORAL | 0 refills | Status: DC | PRN

## 2021-05-14 MED ORDER — PHENYLEPHRINE 40 MCG/ML (10ML) SYRINGE FOR IV PUSH (FOR BLOOD PRESSURE SUPPORT)
PREFILLED_SYRINGE | INTRAVENOUS | Status: AC
  Filled 2021-05-14: qty 10

## 2021-05-14 MED ORDER — BUPIVACAINE-EPINEPHRINE (PF) 0.5% -1:200000 IJ SOLN
INTRAMUSCULAR | Status: AC
  Filled 2021-05-14: qty 60

## 2021-05-14 MED ORDER — ONDANSETRON HCL 4 MG/2ML IJ SOLN: 4.0000 mg | Freq: Once | INTRAMUSCULAR | Status: DC | PRN

## 2021-05-14 SURGICAL SUPPLY — 42 items
ABLATOR ASPIRATE 50D MULTI-PRT (SURGICAL WAND) ×2 IMPLANT
APL PRP STRL LF DISP 70% ISPRP (MISCELLANEOUS) ×1
BAG HAMPER (MISCELLANEOUS) ×2 IMPLANT
BLADE SURG SZ11 CARB STEEL (BLADE) ×2 IMPLANT
BNDG CMPR STD VLCR NS LF 5.8X6 (GAUZE/BANDAGES/DRESSINGS) ×1
BNDG ELASTIC 6X5.8 VLCR NS LF (GAUZE/BANDAGES/DRESSINGS) ×2 IMPLANT
CHLORAPREP W/TINT 26 (MISCELLANEOUS) ×2 IMPLANT
CLOTH BEACON ORANGE TIMEOUT ST (SAFETY) ×2 IMPLANT
COOLER ICEMAN CLASSIC (MISCELLANEOUS) ×2 IMPLANT
CUFF TOURN SGL QUICK 34 (TOURNIQUET CUFF) ×2
CUFF TRNQT CYL 34X4.125X (TOURNIQUET CUFF) ×1 IMPLANT
DISSECTOR 4.0MM X 13CM (MISCELLANEOUS) ×2 IMPLANT
DRAPE HALF SHEET 40X57 (DRAPES) ×2 IMPLANT
GAUZE 4X4 16PLY ~~LOC~~+RFID DBL (SPONGE) ×2 IMPLANT
GAUZE SPONGE 4X4 12PLY STRL (GAUZE/BANDAGES/DRESSINGS) ×2 IMPLANT
GAUZE SPONGE 4X4 16PLY XRAY LF (GAUZE/BANDAGES/DRESSINGS) ×2 IMPLANT
GAUZE XEROFORM 5X9 LF (GAUZE/BANDAGES/DRESSINGS) ×2 IMPLANT
GLOVE SURG UNDER POLY LF SZ7 (GLOVE) ×4 IMPLANT
GOWN STRL REUS W/TWL LRG LVL3 (GOWN DISPOSABLE) ×2 IMPLANT
GOWN STRL REUS W/TWL XL LVL3 (GOWN DISPOSABLE) ×2 IMPLANT
IV NS IRRIG 3000ML ARTHROMATIC (IV SOLUTION) ×6 IMPLANT
KIT BLADEGUARD II DBL (SET/KITS/TRAYS/PACK) ×2 IMPLANT
KIT TURNOVER CYSTO (KITS) ×2 IMPLANT
MANIFOLD NEPTUNE II (INSTRUMENTS) ×2 IMPLANT
MARKER SKIN DUAL TIP RULER LAB (MISCELLANEOUS) ×2 IMPLANT
NEEDLE HYPO 18GX1.5 BLUNT FILL (NEEDLE) ×2 IMPLANT
NEEDLE HYPO 21X1.5 SAFETY (NEEDLE) ×2 IMPLANT
NEEDLE SPNL 18GX3.5 QUINCKE PK (NEEDLE) ×2 IMPLANT
NS IRRIG 1000ML POUR BTL (IV SOLUTION) ×2 IMPLANT
PACK ARTHRO LIMB DRAPE STRL (MISCELLANEOUS) ×2 IMPLANT
PAD ABD 5X9 TENDERSORB (GAUZE/BANDAGES/DRESSINGS) ×2 IMPLANT
PAD ARMBOARD 7.5X6 YLW CONV (MISCELLANEOUS) ×2 IMPLANT
PAD COLD SHLDR WRAP-ON (PAD) ×2 IMPLANT
PAD FOR LEG HOLDER (MISCELLANEOUS) ×2 IMPLANT
PADDING CAST COTTON 6X4 STRL (CAST SUPPLIES) ×2 IMPLANT
SET ARTHROSCOPY INST (INSTRUMENTS) ×2 IMPLANT
SET BASIN LINEN APH (SET/KITS/TRAYS/PACK) ×2 IMPLANT
SUT ETHILON 3 0 FSL (SUTURE) ×2 IMPLANT
SYR 10ML LL (SYRINGE) ×2 IMPLANT
SYR 30ML LL (SYRINGE) ×4 IMPLANT
TUBE CONNECTING 12X1/4 (SUCTIONS) ×6 IMPLANT
TUBING IN/OUT FLOW W/MAIN PUMP (TUBING) ×2 IMPLANT

## 2021-05-14 NOTE — Anesthesia Postprocedure Evaluation (Signed)
Anesthesia Post Note  Patient: Dustin [name]  Procedure(s) Performed: LEFT KNEE ARTHROSCOPY WITH MEDIAL MENISCECTOMY (Left: Knee)  Patient location during evaluation: Phase II Anesthesia Type: General Level of consciousness: awake Pain management: pain level controlled Vital Signs Assessment: post-procedure vital signs reviewed and stable Respiratory status: spontaneous breathing and respiratory function stable Cardiovascular status: blood pressure returned to baseline and stable Postop Assessment: no headache and no apparent nausea or vomiting Anesthetic complications: no Comments: Late entry   No notable events documented.   Last Vitals:  Vitals:   05/14/21 0734 05/14/21 1000  BP: (!) 118/59 138/78  Pulse:  74  Resp:  14  Temp:  36.8 C  SpO2:  100%    Last Pain:  Vitals:   05/14/21 1000  TempSrc:   PainSc: [place]

## 2021-05-14 NOTE — Anesthesia Procedure Notes (Addendum)
Procedure Name: LMA Insertion Date/Time: 05/14/2021 8:42 AM Performed by: Orlie Dakin, CRNA Pre-anesthesia Checklist: Patient identified, Emergency Drugs available, Suction available and Patient being monitored Patient Re-evaluated:Patient Re-evaluated prior to induction Oxygen Delivery Method: Circle system utilized Preoxygenation: Pre-oxygenation with 100% oxygen Induction Type: IV induction Ventilation: Mask ventilation without difficulty LMA: LMA inserted LMA Size: 5.0 Tube type: Oral Number of attempts: 1 Placement Confirmation: positive ETCO2 Tube secured with: Tape Dental Injury: Teeth and Oropharynx as per pre-operative assessment

## 2021-05-14 NOTE — Brief Op Note (Signed)
05/14/2021  9:56 AM  PATIENT:  Dustin [name]  69 y.o. male  PRE-OPERATIVE DIAGNOSIS:  torn medial meniscus left knee  POST-OPERATIVE DIAGNOSIS:  torn medial meniscus left knee  PROCEDURE:  Procedure(s): LEFT KNEE ARTHROSCOPY WITH MEDIAL MENISCECTOMY (Left)  SURGEON:  Surgeon(s) and Role:    Carole Civil, MD - Primary  PHYSICIAN ASSISTANT:   ASSISTANTS: none   ANESTHESIA:   general  EBL:  none   BLOOD ADMINISTERED:none  DRAINS: none   LOCAL MEDICATIONS USED:  MARCAINE     SPECIMEN:  No Specimen  DISPOSITION OF SPECIMEN:  N/A  COUNTS:  YES  TOURNIQUET:  * Missing tourniquet times found for documented tourniquets in log: [id number] *  DICTATION: .Dragon Dictation  PLAN OF CARE: Discharge to home after PACU  PATIENT DISPOSITION:  PACU - hemodynamically stable.   Delay start of Pharmacological VTE agent (>24hrs) due to surgical blood loss or risk of bleeding: not applicable

## 2021-05-14 NOTE — Transfer of Care (Signed)
Immediate Anesthesia Transfer of Care Note  Patient: Dustin [name]  Procedure(s) Performed: LEFT KNEE ARTHROSCOPY WITH MEDIAL MENISCECTOMY (Left: Knee)  Patient Location: PACU  Anesthesia Type:General  Level of Consciousness: awake, alert  and oriented  Airway & Oxygen Therapy: Patient Spontanous Breathing and Patient connected to face mask oxygen  Post-op Assessment: Report given to RN and Post -op Vital signs reviewed and stable  Post vital signs: Reviewed and stable  Last Vitals:  Vitals Value Taken Time  BP    Temp    Pulse 71 05/14/21 0957  Resp 12 05/14/21 0957  SpO2 99 % 05/14/21 0957  Vitals shown include unvalidated device data.  Last Pain:  Vitals:   05/14/21 0714  TempSrc: Oral  PainSc: 6          Complications: No notable events documented.

## 2021-05-14 NOTE — Interval H&P Note (Signed)
History and Physical Interval Note:  05/14/2021 8:30 AM  Dustin [name]  has presented today for surgery, with the diagnosis of torn medial meniscus left knee.  The various methods of treatment have been discussed with the patient and family. After consideration of risks, benefits and other options for treatment, the patient has consented to  Procedure(s): LEFT KNEE ARTHROSCOPY WITH MEDIAL MENISCECTOMY (Left) as a surgical intervention.  The patient's history has been reviewed, patient examined, no change in status, stable for surgery.  I have reviewed the patient's chart and labs.  Questions were answered to the patient's satisfaction.     [name]

## 2021-05-14 NOTE — Op Note (Signed)
05/14/2021  9:57 AM  Knee arthroscopy dictation  Preop diagnosis torn medial meniscus   Postop diagnosis same   Procedure arthroscopy aprtial medial menisectomy  Surgeon Aline Brochure  Operative findings  MEDIAL diffuse grade 3 chondromalacia radial complex tear medial meniscus extending from post horn to the body and the tibial had grade 2 disease   LATERAL normal   PTF patella grade 3 and some grade 4 disease the trochlea was normal   NOTCH normal   Mild synovitis all compartments    The patient was identified in the preoperative holding area using 2 approved identification mechanisms. The chart was reviewed and updated. The surgical site was confirmed as left  knee and marked with an indelible marker.  The patient was taken to the operating room for anesthesia. After successful  GENERAL  anesthesia, ANCEF was used as IV antibiotics.  The patient was placed in the supine position with the (LEFT KNEE/ LEG ) the operative extremity in an arthroscopic leg holder and the opposite extremity in a padded leg holder.  The timeout was executed.  A lateral portal was established with an 11 blade and the scope was introduced into the joint. A diagnostic arthroscopy was performed in circumferential manner examining the entire knee joint. A medial portal was established and the diagnostic arthroscopy was repeated using a probe to palpate intra-articular structures as they were encountered.    The MEDIAL  meniscus was resected using a duckbill forceps. The meniscal fragments were removed with a motorized shaver. The meniscus was balanced with a combination of a motorized shaver and a 50 ArthroCare wand until a stable rim was obtained. MOVING THE SCOPE MEDIALLY TO ACCESS THE BODY OF THE MENISCUS    The arthroscopic pump was placed on the wash mode and any excess debris was removed from the joint using suction.  60 cc of Marcaine with epinephrine was injected through the arthroscope.  The  portals were closed with 3-0 nylon suture.  A sterile bandage, Ace wrap and Cryo/Cuff was placed and the Cryo/Cuff was activated. The patient was taken to the recovery room in stable condition.  PHYSICIAN ASSISTANT: no  ASSISTANTS: none   ANESTHESIA:   GENERAL   EBL:  none   BLOOD ADMINISTERED:none  DRAINS: none   LOCAL MEDICATIONS USED:  MARCAINE     SPECIMEN:  No Specimen  DISPOSITION OF SPECIMEN:  N/A  COUNTS:  YES   DICTATION: .Dragon Dictation  PLAN OF CARE: Discharge to home after PACU  PATIENT DISPOSITION:  PACU - hemodynamically stable.   Delay start of Pharmacological VTE agent (>24hrs) due to surgical blood loss or risk of bleeding: not applicable

## 2021-05-14 NOTE — Anesthesia Preprocedure Evaluation (Signed)
Anesthesia Evaluation  Patient identified by MRN, date of birth, ID band Patient awake    Reviewed: Allergy & Precautions, H&P , NPO status , Patient's Chart, lab work & pertinent test results, reviewed documented beta blocker date and time   Airway Mallampati: II  TM Distance: >3 FB Neck ROM: full    Dental no notable dental hx.    Pulmonary neg pulmonary ROS, Current Smoker and Patient abstained from smoking.,    Pulmonary exam normal breath sounds clear to auscultation       Cardiovascular Exercise Tolerance: Good hypertension, negative cardio ROS   Rhythm:regular Rate:Normal     Neuro/Psych negative neurological ROS  negative psych ROS   GI/Hepatic negative GI ROS, Neg liver ROS,   Endo/Other  negative endocrine ROS  Renal/GU negative Renal ROS  negative genitourinary   Musculoskeletal   Abdominal   Peds  Hematology negative hematology ROS (+)   Anesthesia Other Findings   Reproductive/Obstetrics negative OB ROS                             Anesthesia Physical Anesthesia Plan  ASA: 2  Anesthesia Plan: General and General LMA   Post-op Pain Management:    Induction:   PONV Risk Score and Plan: Ondansetron  Airway Management Planned:   Additional Equipment:   Intra-op Plan:   Post-operative Plan:   Informed Consent: I have reviewed the patients History and Physical, chart, labs and discussed the procedure including the risks, benefits and alternatives for the proposed anesthesia with the patient or authorized representative who has indicated his/her understanding and acceptance.     Dental Advisory Given  Plan Discussed with: CRNA  Anesthesia Plan Comments:         Anesthesia Quick Evaluation

## 2021-05-17 ENCOUNTER — Encounter (HOSPITAL_COMMUNITY): Payer: Self-pay | Admitting: Orthopedic Surgery

## 2021-05-19 ENCOUNTER — Ambulatory Visit (INDEPENDENT_AMBULATORY_CARE_PROVIDER_SITE_OTHER): Payer: BC Managed Care – PPO | Admitting: Orthopedic Surgery

## 2021-05-19 ENCOUNTER — Other Ambulatory Visit: Payer: Self-pay

## 2021-05-19 DIAGNOSIS — G8918 Other acute postprocedural pain: Secondary | ICD-10-CM

## 2021-05-19 MED ORDER — IBUPROFEN 800 MG PO TABS: 800.0000 mg | ORAL_TABLET | Freq: Three times a day (TID) | ORAL | 1 refills | Status: DC | PRN

## 2021-05-19 MED ORDER — HYDROCODONE-ACETAMINOPHEN 7.5-325 MG PO TABS
1.0000 | ORAL_TABLET | Freq: Four times a day (QID) | ORAL | 0 refills | Status: AC | PRN
Start: 2021-05-19 — End: 2021-05-24

## 2021-05-19 NOTE — Progress Notes (Signed)
  Chief Complaint  Patient presents with   Routine Post Op    Left knee/ DOS 05/14/21   Postop visit #1 status post arthroscopy left knee patient also had a subchondral fracture which was treated with a brace prior to surgery had an extensive meniscal tear medially had chondral changes grade 2 through 4 throughout the joint complains that the pain medication Vicodin is not helping and he still having some swelling  His portal sites look clean he has a modest effusion not too bad for this point he can bend 90 degrees he is having trouble extending  Recommend quadriceps exercises knee flexion exercises  Changes pain medicine to hydrocodone 7.5 mg with ibuprofen 800 mg  He should continue icing and start his quadricep strengthening see me in 2 weeks

## 2021-06-02 ENCOUNTER — Other Ambulatory Visit: Payer: Self-pay

## 2021-06-02 ENCOUNTER — Encounter: Payer: Self-pay | Admitting: Orthopedic Surgery

## 2021-06-02 ENCOUNTER — Ambulatory Visit (INDEPENDENT_AMBULATORY_CARE_PROVIDER_SITE_OTHER): Payer: BC Managed Care – PPO | Admitting: Orthopedic Surgery

## 2021-06-02 DIAGNOSIS — M23204 Derangement of unspecified medial meniscus due to old tear or injury, left knee: Secondary | ICD-10-CM | POA: Diagnosis not present

## 2021-06-02 DIAGNOSIS — M1710 Unilateral primary osteoarthritis, unspecified knee: Secondary | ICD-10-CM

## 2021-06-02 DIAGNOSIS — M84452D Pathological fracture, left femur, subsequent encounter for fracture with routine healing: Secondary | ICD-10-CM

## 2021-06-02 DIAGNOSIS — Z9889 Other specified postprocedural states: Secondary | ICD-10-CM

## 2021-06-02 NOTE — Progress Notes (Signed)
POST OP VISIT   Encounter Diagnoses  Name Primary?   S/P left knee arthroscopy 05/14/21 Yes   Degenerative tear of medial meniscus of left knee    Insufficiency fracture of left femur with routine healing, subsequent encounter    Primary localized osteoarthritis of knee     Postop day #19  PRE-OPERATIVE DIAGNOSIS:  torn medial meniscus left knee   POST-OPERATIVE DIAGNOSIS:  torn medial meniscus left knee   PROCEDURE:  Procedure(s): LEFT KNEE ARTHROSCOPY WITH MEDIAL MENISCECTOMY (Left)  Operative findings   MEDIAL diffuse grade 3 chondromalacia radial complex tear medial meniscus extending from post horn to the body and the tibial had grade 2 disease    LATERAL normal    PTF patella grade 3 and some grade 4 disease the trochlea was normal    NOTCH normal    Mild synovitis all compartments   Mr. Dustin [name] continues to improve  His pain has subsided significantly  He is ambulating with a cane  He still has a slight effusion his range of motion is 3-125 degrees  Knee feels stable medial femoral condyle is nontender  We recommend that he continue his knee exercises and icing we placed him in a playmaker brace for his insufficiency fracture and I will see him in 3 weeks

## 2021-06-16 ENCOUNTER — Telehealth: Payer: Self-pay | Admitting: Orthopedic Surgery

## 2021-06-16 NOTE — Telephone Encounter (Signed)
Called patient following his call relaying that his [employer] notified him that they did not receive all the pages of his claim form, which we had previously faxed, and returned to [employer], our forms/records provider. I have emailed our [employer] contact to follow up. I have left a message for patient to call back to further discuss.

## 2021-06-23 ENCOUNTER — Ambulatory Visit (INDEPENDENT_AMBULATORY_CARE_PROVIDER_SITE_OTHER): Payer: BC Managed Care – PPO | Admitting: Orthopedic Surgery

## 2021-06-23 ENCOUNTER — Encounter: Payer: Self-pay | Admitting: Orthopedic Surgery

## 2021-06-23 ENCOUNTER — Other Ambulatory Visit: Payer: Self-pay

## 2021-06-23 DIAGNOSIS — Z9889 Other specified postprocedural states: Secondary | ICD-10-CM

## 2021-06-23 NOTE — Progress Notes (Signed)
POST OP VISIT        Encounter Diagnoses  Name Primary?   S/P left knee arthroscopy 05/14/21 Yes   Degenerative tear of medial meniscus of left knee     Insufficiency fracture of left femur with routine healing, subsequent encounter     Primary localized osteoarthritis of knee        Postop day #19  PRE-OPERATIVE DIAGNOSIS:  torn medial meniscus left knee   POST-OPERATIVE DIAGNOSIS:  torn medial meniscus left knee   PROCEDURE:  Procedure(s): LEFT KNEE ARTHROSCOPY WITH MEDIAL MENISCECTOMY (Left)  Dustin [name] continues to improve he is walking very well he is got his range of motion his knee is not swollen he is almost there  Continue current treatment including bracing follow-up in 5 weeks

## 2021-07-02 DIAGNOSIS — N393 Stress incontinence (female) (male): Secondary | ICD-10-CM | POA: Diagnosis not present

## 2021-07-02 DIAGNOSIS — Z8546 Personal history of malignant neoplasm of prostate: Secondary | ICD-10-CM | POA: Diagnosis not present

## 2021-07-02 DIAGNOSIS — C61 Malignant neoplasm of prostate: Secondary | ICD-10-CM | POA: Diagnosis not present

## 2021-07-02 DIAGNOSIS — N5231 Erectile dysfunction following radical prostatectomy: Secondary | ICD-10-CM | POA: Diagnosis not present

## 2021-07-28 ENCOUNTER — Ambulatory Visit (INDEPENDENT_AMBULATORY_CARE_PROVIDER_SITE_OTHER): Payer: BC Managed Care – PPO | Admitting: Orthopedic Surgery

## 2021-07-28 ENCOUNTER — Encounter: Payer: Self-pay | Admitting: Orthopedic Surgery

## 2021-07-28 ENCOUNTER — Other Ambulatory Visit: Payer: Self-pay

## 2021-07-28 DIAGNOSIS — Z9889 Other specified postprocedural states: Secondary | ICD-10-CM

## 2021-07-28 NOTE — Patient Instructions (Signed)
Exercises continue   RTW NOV 14// change paperwork

## 2021-07-28 NOTE — Progress Notes (Signed)
Chief Complaint  Patient presents with   Routine Post Op    DOS 05/14/21        Encounter Diagnoses  Name Primary?   S/P left knee arthroscopy 05/14/21 Yes   Degenerative tear of medial meniscus of left knee     Insufficiency fracture of left femur with routine healing, subsequent encounter     Primary localized osteoarthritis of knee        PRE-OPERATIVE DIAGNOSIS:  torn medial meniscus left knee   POST-OPERATIVE DIAGNOSIS:  torn medial meniscus left knee   PROCEDURE:  Procedure(s): LEFT KNEE ARTHROSCOPY WITH MEDIAL MENISCECTOMY (Left)  Mr. Sheller is progressing towards the third month since his surgery he has about 2 weeks to go  He is doing well.  His insufficiency fracture appears to have healed as his brace is now hurting when he walks.  When the brace off he does not have pain.  He is having some giving out symptoms which is probably related to quadriceps weakness and dependence on the brace  He is concerned about going to work in the next week or so so we have extended that out to November 14  He can stop the brace he should continue his exercises.  He will follow-up as needed and let us know if the 14th does not work out

## 2021-08-11 DIAGNOSIS — I10 Essential (primary) hypertension: Secondary | ICD-10-CM | POA: Diagnosis not present

## 2021-08-11 DIAGNOSIS — F17210 Nicotine dependence, cigarettes, uncomplicated: Secondary | ICD-10-CM | POA: Diagnosis not present

## 2021-08-11 DIAGNOSIS — Z23 Encounter for immunization: Secondary | ICD-10-CM | POA: Diagnosis not present

## 2021-08-11 DIAGNOSIS — Z0001 Encounter for general adult medical examination with abnormal findings: Secondary | ICD-10-CM | POA: Diagnosis not present

## 2021-08-11 DIAGNOSIS — J410 Simple chronic bronchitis: Secondary | ICD-10-CM | POA: Diagnosis not present

## 2021-08-11 DIAGNOSIS — Z8546 Personal history of malignant neoplasm of prostate: Secondary | ICD-10-CM | POA: Diagnosis not present

## 2021-10-05 DIAGNOSIS — C61 Malignant neoplasm of prostate: Secondary | ICD-10-CM | POA: Diagnosis not present

## 2021-10-20 DIAGNOSIS — F17200 Nicotine dependence, unspecified, uncomplicated: Secondary | ICD-10-CM | POA: Diagnosis not present

## 2021-10-20 DIAGNOSIS — J410 Simple chronic bronchitis: Secondary | ICD-10-CM | POA: Diagnosis not present

## 2021-10-20 DIAGNOSIS — Z6832 Body mass index (BMI) 32.0-32.9, adult: Secondary | ICD-10-CM | POA: Diagnosis not present

## 2021-10-20 DIAGNOSIS — I10 Essential (primary) hypertension: Secondary | ICD-10-CM | POA: Diagnosis not present

## 2021-10-29 ENCOUNTER — Other Ambulatory Visit (HOSPITAL_COMMUNITY): Payer: Self-pay | Admitting: Urology

## 2021-10-29 DIAGNOSIS — C61 Malignant neoplasm of prostate: Secondary | ICD-10-CM

## 2021-11-10 ENCOUNTER — Other Ambulatory Visit: Payer: Self-pay

## 2021-11-10 ENCOUNTER — Encounter (HOSPITAL_COMMUNITY)
Admission: RE | Admit: 2021-11-10 | Discharge: 2021-11-10 | Disposition: A | Discharge status: Home / Self Care | Payer: BC Managed Care – PPO | Source: Ambulatory Visit | Attending: Urology | Admitting: Urology

## 2021-11-10 DIAGNOSIS — C61 Malignant neoplasm of prostate: Secondary | ICD-10-CM | POA: Insufficient documentation

## 2021-11-10 DIAGNOSIS — R972 Elevated prostate specific antigen [PSA]: Secondary | ICD-10-CM | POA: Diagnosis not present

## 2021-11-10 IMAGING — CT NM PET TUM IMG SKULL BASE T - THIGH
7 series · 25 of 25 positions shown · non-contrast
Comparison: None.

CLINICAL DATA: 70-year-old male with rising PSA. Prior
prostatectomy [[facility]].

EXAM:
NUCLEAR MEDICINE PET SKULL BASE TO THIGH
TECHNIQUE: 9.3 mCi F18 Piflufolastat (Pylarify) was injected intravenously.
Full-ring PET imaging was performed from the skull base to thigh
after the radiotracer. CT data was obtained and used for attenuation
correction and anatomic localization.

[Series 3: pet sk_thigh ac · axial · 5.0mm · 4.07mm/px · z∈[-1416,-408]mm · 6 of 253 slices shown]
[im 1/253]
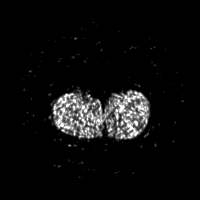
[im 51/253]
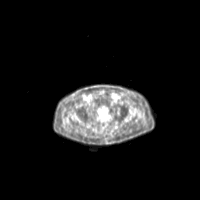
[im 101/253]
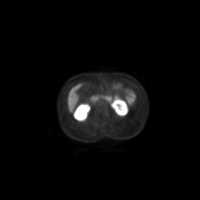
[im 152/253]
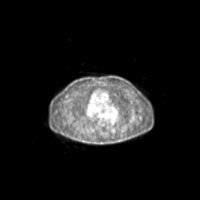
[im 202/253]
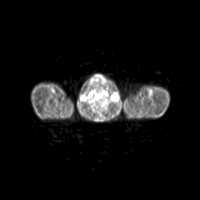
[im 253/253]
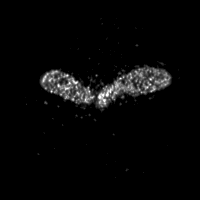

[Series 4: ct sk_thigh 5.0 bf37 · axial · 5.0mm · 0.98mm/px · z∈[-1416,-408]mm · 6 of 253 slices shown]
[im 1/253]
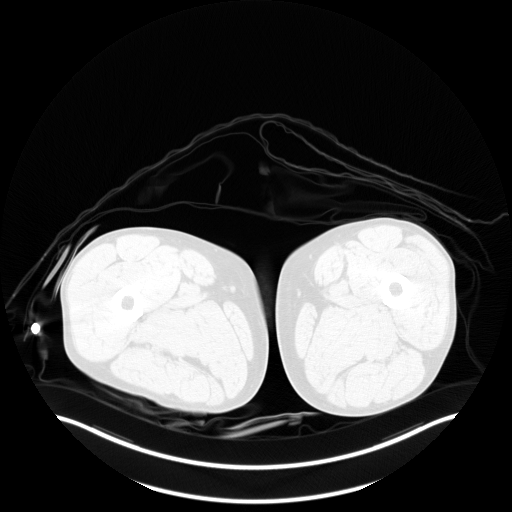
[im 51/253]
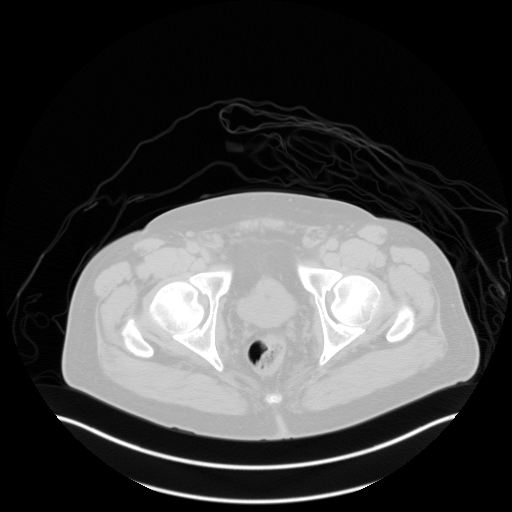
[im 101/253]
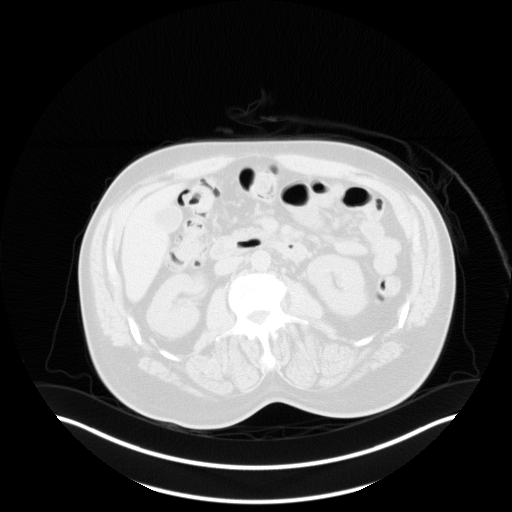
[im 152/253]
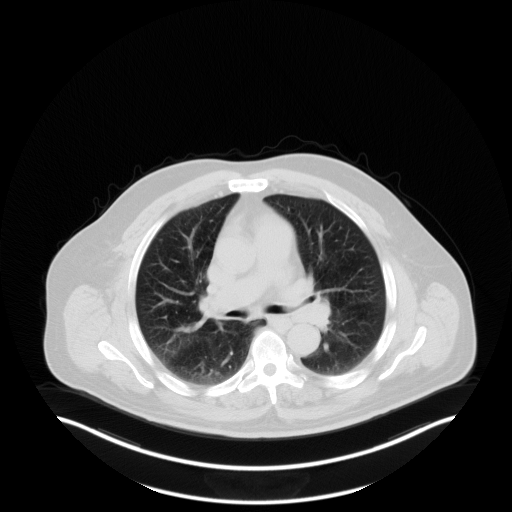
[im 202/253  brain]
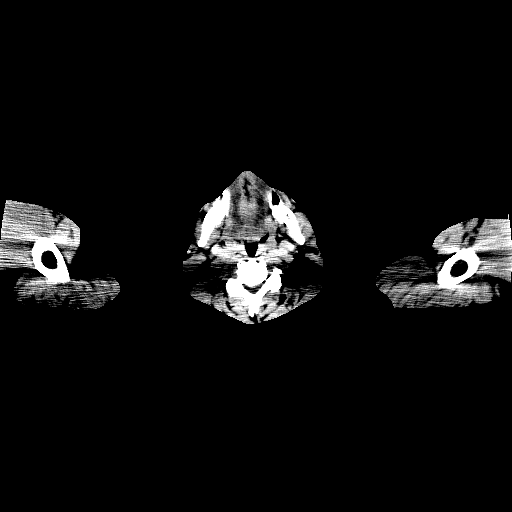
[im 253/253]
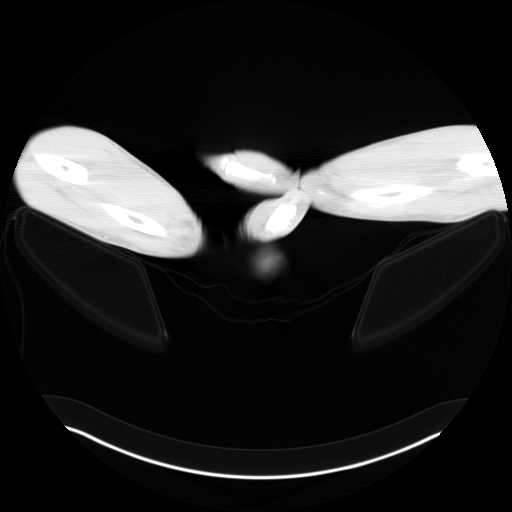

[Series 5: pet sk_thigh nac · axial · 5.0mm · 4.07mm/px · z∈[-1416,-408]mm · 5 of 253 slices shown]
[im 1/253]
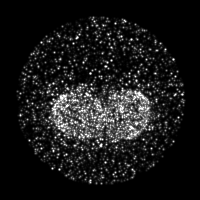
[im 64/253]
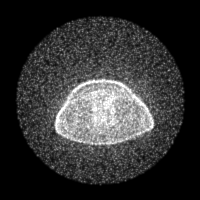
[im 127/253]
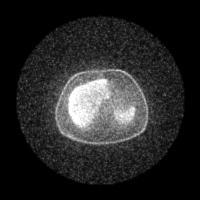
[im 190/253]
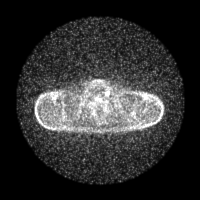
[im 253/253]
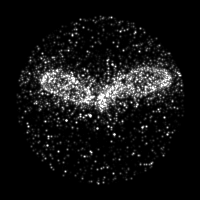

[Series 8: ct sk_thigh 5.0 br59 lung_bone · axial · 5.0mm · 0.69mm/px · 1 of 63 slices shown]
[im 1/63]
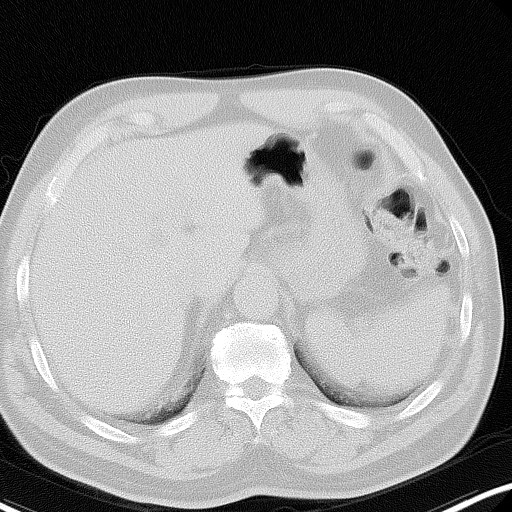

[Series 603: <mip collection> · coronal · 2.09mm/px · 1 of 32 slices shown]
[im 1/32]
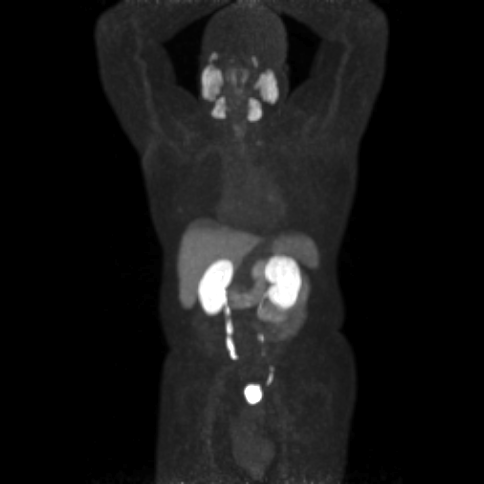

[Series 604: fused cor · 1 of 59 slices shown]
[im 1/59]
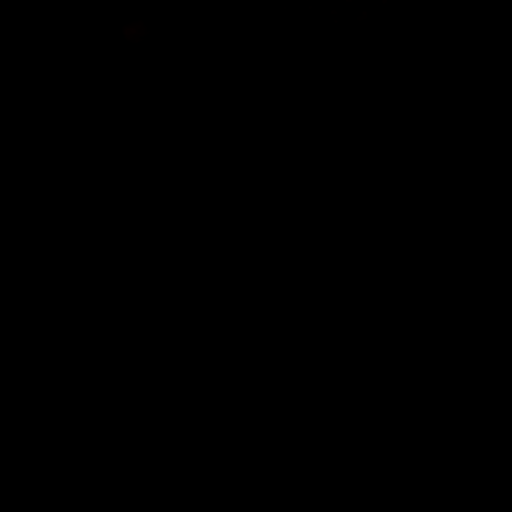

[Series 605: range-ct sk_thigh 5.0 bf37-tra-<alpha range> · 5 of 239 slices shown]
[im 1/239]
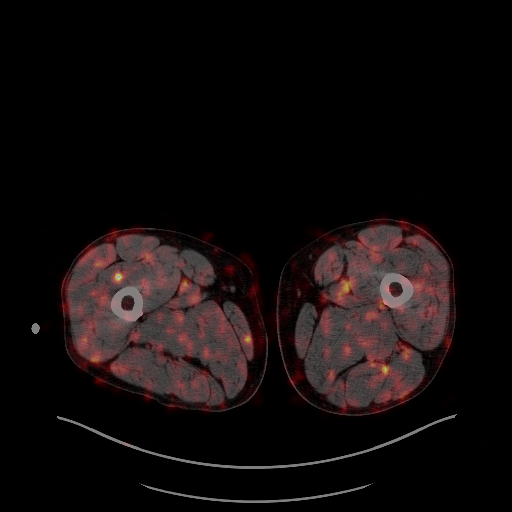
[im 60/239]
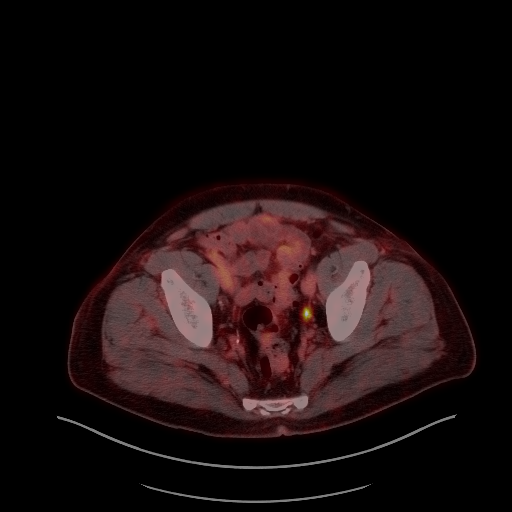
[im 120/239]
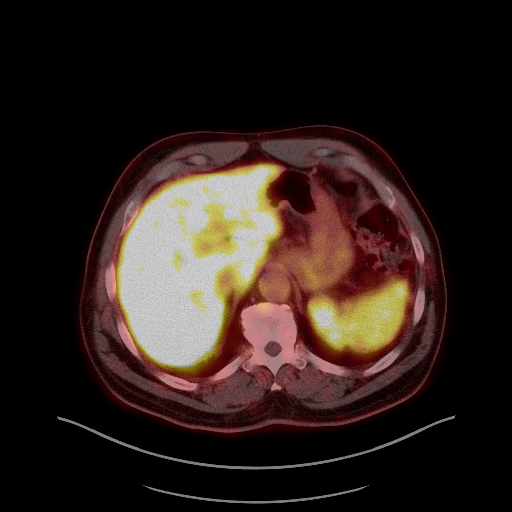
[im 179/239]
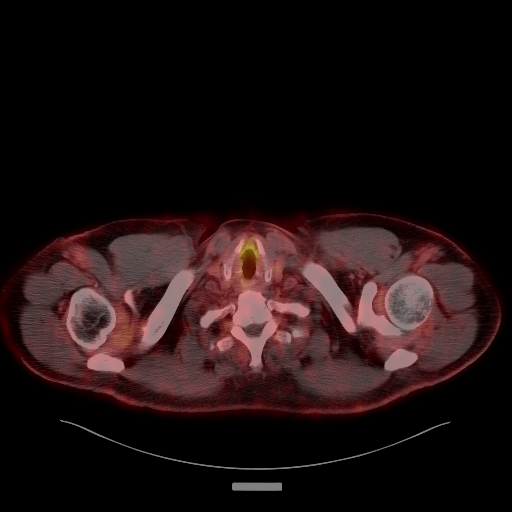
[im 239/239]
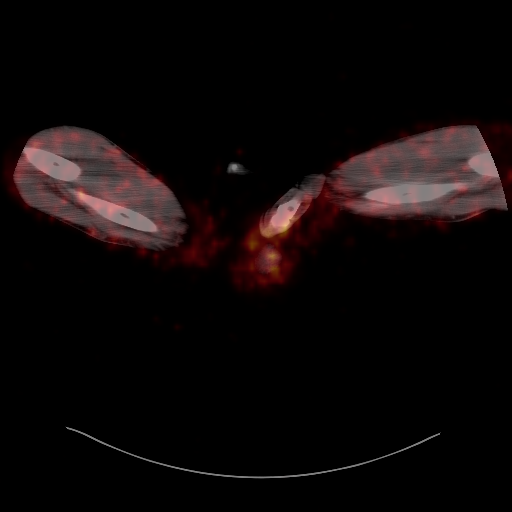

[25 of 25 positions shown; findings below may reference images not displayed]

FINDINGS: NECK

No radiotracer activity in neck lymph nodes.

Incidental CT finding: None

CHEST

No radiotracer accumulation within mediastinal or hilar lymph nodes.
No suspicious pulmonary nodules on the CT scan.

Incidental CT finding: None

ABDOMEN/PELVIS

Prostate: No focal activity in the prostatectomy bed.

Lymph nodes: No abnormal radiotracer accumulation within pelvic or
abdominal nodes.

Liver: No evidence of liver metastasis

Incidental CT finding: None

SKELETON

No focal  activity to suggest skeletal metastasis.
IMPRESSION: 1. No evidence local prostate cancer recurrence in the prostatectomy
bed.
2. No evidence metastatic adenopathy in the pelvis or periaortic
retroperitoneum.
3. No evidence of visceral metastasis or skeletal metastasis.

## 2021-11-10 MED ORDER — PIFLIFOLASTAT F 18 (PYLARIFY) INJECTION
9.0000 | Freq: Once | INTRAVENOUS | Status: AC
  Administered 2021-11-10: 13:00:00 9.3 via INTRAVENOUS

## 2021-12-13 DIAGNOSIS — C61 Malignant neoplasm of prostate: Secondary | ICD-10-CM | POA: Diagnosis not present

## 2021-12-22 DIAGNOSIS — C61 Malignant neoplasm of prostate: Secondary | ICD-10-CM | POA: Diagnosis not present

## 2021-12-22 DIAGNOSIS — N5231 Erectile dysfunction following radical prostatectomy: Secondary | ICD-10-CM | POA: Diagnosis not present

## 2021-12-22 DIAGNOSIS — N393 Stress incontinence (female) (male): Secondary | ICD-10-CM | POA: Diagnosis not present

## 2021-12-31 ENCOUNTER — Telehealth: Payer: Self-pay | Admitting: Orthopedic Surgery

## 2021-12-31 NOTE — Telephone Encounter (Signed)
Patient called to ask if he may pick up a copy of his [employer] forms from July of 2022; aware we are printing and that he may pick up. ?

## 2022-01-05 DIAGNOSIS — I10 Essential (primary) hypertension: Secondary | ICD-10-CM | POA: Diagnosis not present

## 2022-01-05 DIAGNOSIS — Z720 Tobacco use: Secondary | ICD-10-CM | POA: Diagnosis not present

## 2022-01-05 DIAGNOSIS — C61 Malignant neoplasm of prostate: Secondary | ICD-10-CM | POA: Diagnosis not present

## 2022-01-05 DIAGNOSIS — R9721 Rising PSA following treatment for malignant neoplasm of prostate: Secondary | ICD-10-CM | POA: Diagnosis not present

## 2022-01-14 DIAGNOSIS — Z9079 Acquired absence of other genital organ(s): Secondary | ICD-10-CM | POA: Diagnosis not present

## 2022-01-14 DIAGNOSIS — C61 Malignant neoplasm of prostate: Secondary | ICD-10-CM | POA: Diagnosis not present

## 2022-01-14 DIAGNOSIS — R9721 Rising PSA following treatment for malignant neoplasm of prostate: Secondary | ICD-10-CM | POA: Diagnosis not present

## 2022-01-14 DIAGNOSIS — Z8546 Personal history of malignant neoplasm of prostate: Secondary | ICD-10-CM | POA: Diagnosis not present

## 2022-01-20 ENCOUNTER — Ambulatory Visit (HOSPITAL_COMMUNITY)
Admission: RE | Admit: 2022-01-20 | Discharge: 2022-01-20 | Disposition: A | Discharge status: Home / Self Care | Payer: BC Managed Care – PPO | Source: Ambulatory Visit | Attending: Gerontology | Admitting: Gerontology

## 2022-01-20 ENCOUNTER — Other Ambulatory Visit (HOSPITAL_COMMUNITY): Payer: Self-pay | Admitting: Gerontology

## 2022-01-20 DIAGNOSIS — M25531 Pain in right wrist: Secondary | ICD-10-CM

## 2022-01-20 DIAGNOSIS — J410 Simple chronic bronchitis: Secondary | ICD-10-CM | POA: Diagnosis not present

## 2022-01-20 DIAGNOSIS — F17200 Nicotine dependence, unspecified, uncomplicated: Secondary | ICD-10-CM | POA: Diagnosis not present

## 2022-01-20 DIAGNOSIS — R600 Localized edema: Secondary | ICD-10-CM | POA: Diagnosis not present

## 2022-01-20 DIAGNOSIS — R200 Anesthesia of skin: Secondary | ICD-10-CM | POA: Diagnosis not present

## 2022-01-20 DIAGNOSIS — I10 Essential (primary) hypertension: Secondary | ICD-10-CM | POA: Diagnosis not present

## 2022-01-20 IMAGING — DX DG WRIST COMPLETE 3+V*R*
4 series · 4 of 4 positions shown · non-contrast
Comparison: None.

CLINICAL DATA: Right wrist pain radiates into forearm with numbness
in fingers for 1 week.

EXAM:
RIGHT WRIST - COMPLETE 3+ VIEW

[wrist pa]
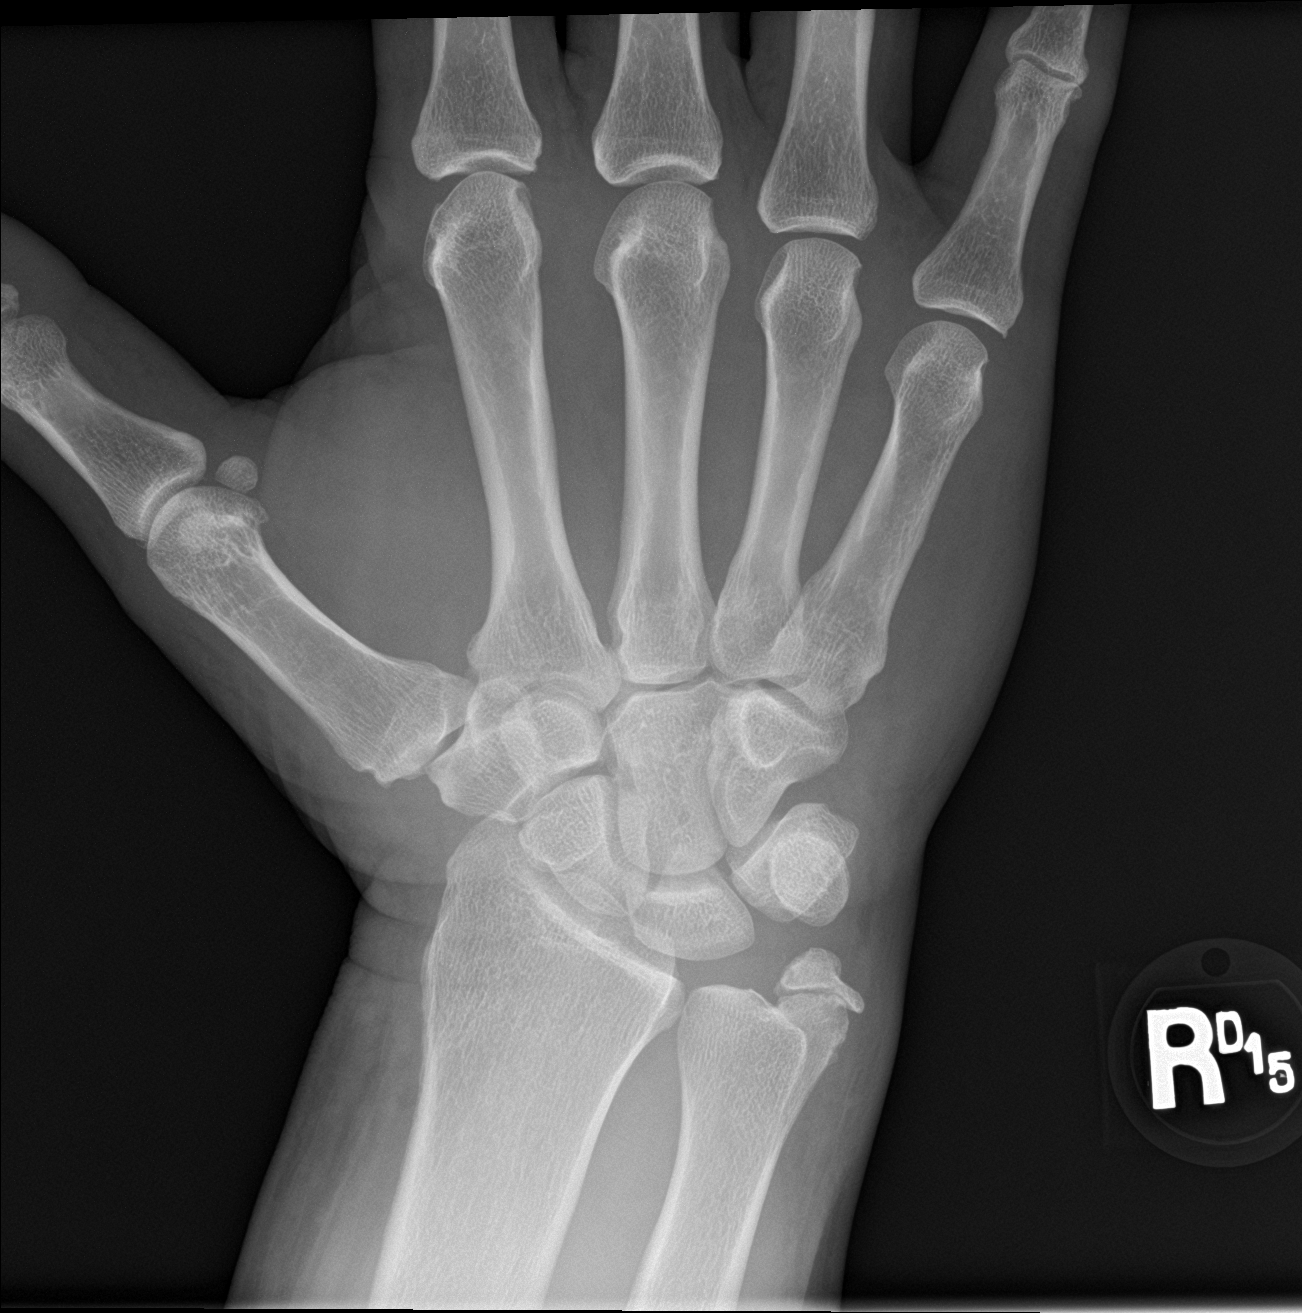

[wrist obl]
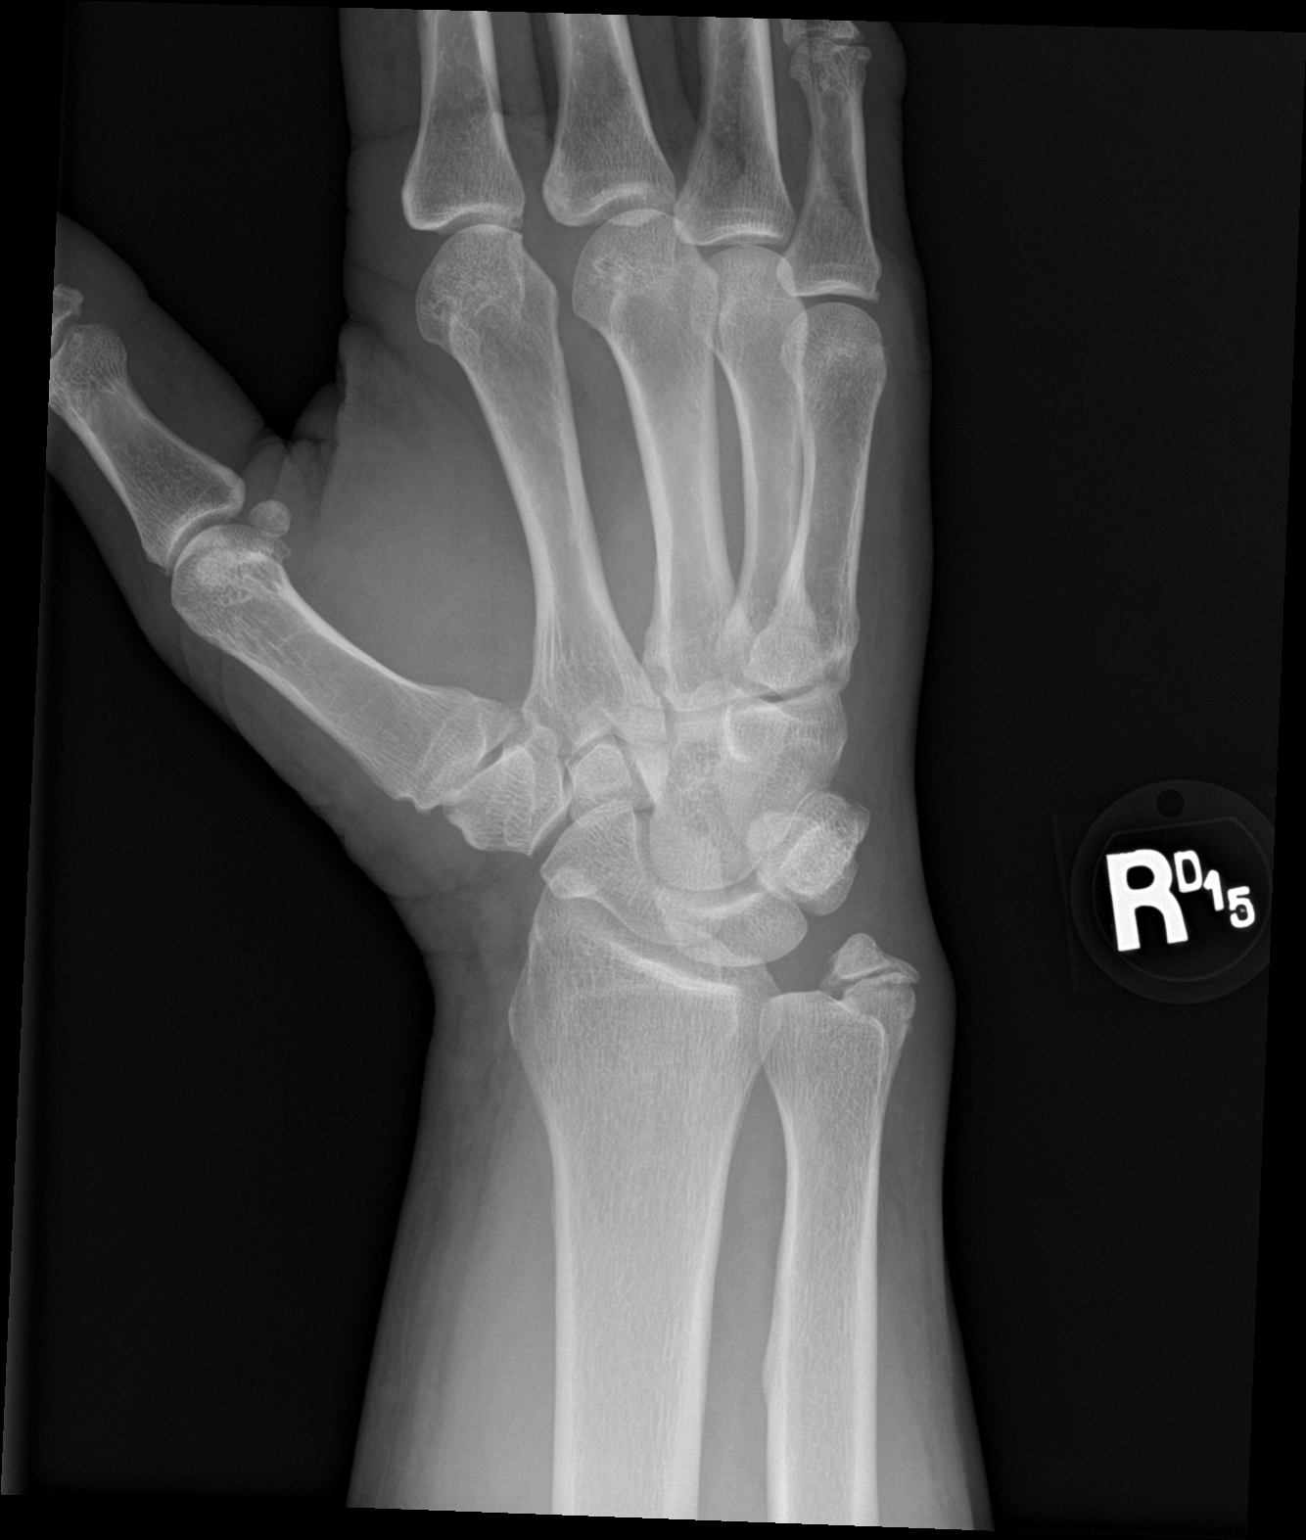

[wrist lat]
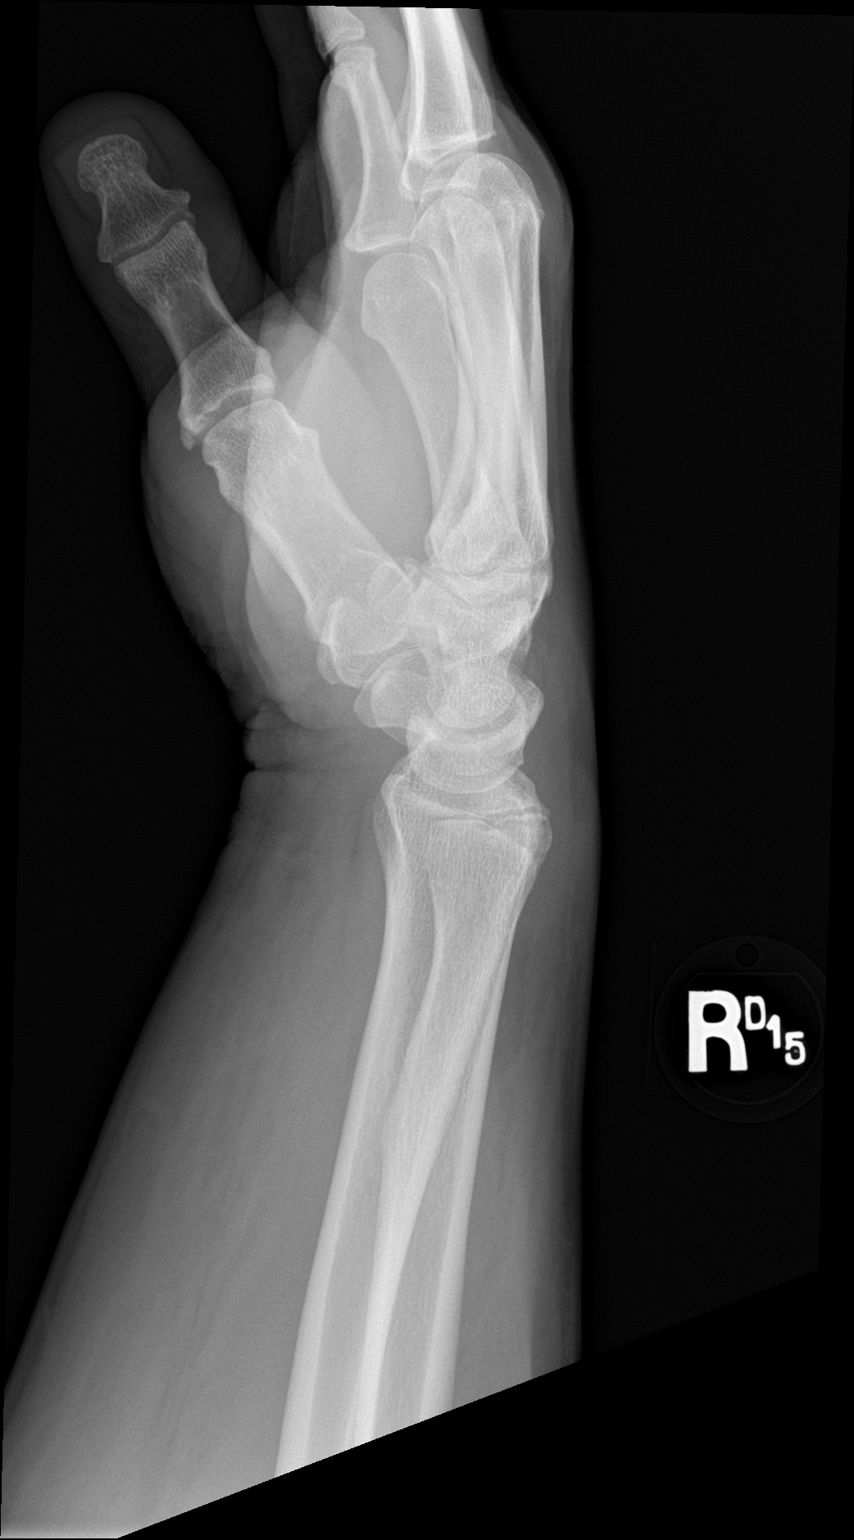

[wrist navicular]
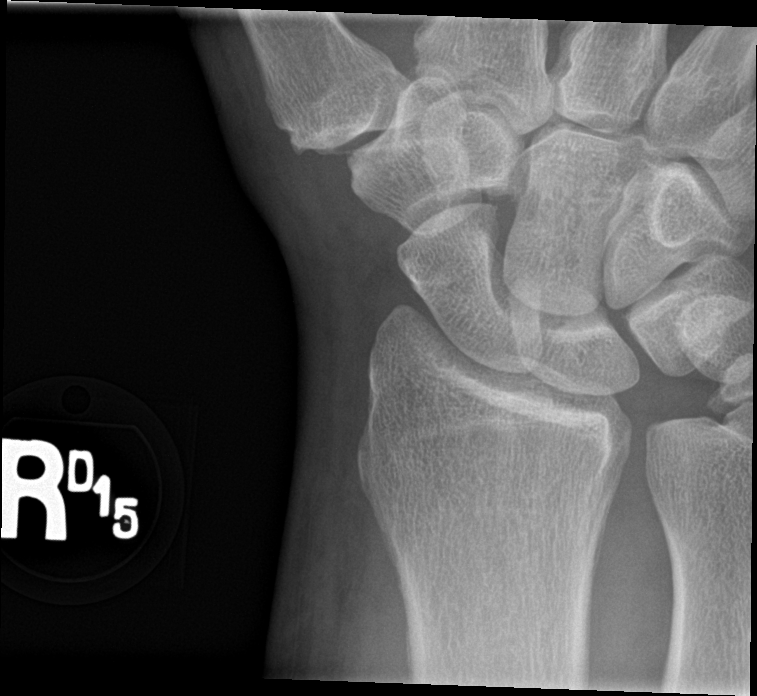

[4 of 4 positions shown; findings below may reference images not displayed]

FINDINGS: Normal alignment. Mild osteoarthritis of the thumb carpal metacarpal
joint with joint space narrowing and spurring. The joint spaces are
otherwise preserved. There is well corticated osseous density
adjacent to the ulna styloid. No fracture, erosion, or bone
destruction. Mild soft tissue edema is noted, more prominent about
the ulnar aspect of the wrist. No soft tissue calcifications.
IMPRESSION: 1. Mild osteoarthritis of the thumb carpometacarpal joint.
2. Soft tissue edema, more prominent about the ulnar aspect of the
wrist.
3. Well corticated density distal to the ulna styloid may represent
an irregular ossicle or sequela of remote injury.

## 2022-02-09 DIAGNOSIS — R9721 Rising PSA following treatment for malignant neoplasm of prostate: Secondary | ICD-10-CM | POA: Diagnosis not present

## 2022-02-09 DIAGNOSIS — C61 Malignant neoplasm of prostate: Secondary | ICD-10-CM | POA: Diagnosis not present

## 2022-03-30 DIAGNOSIS — C61 Malignant neoplasm of prostate: Secondary | ICD-10-CM | POA: Diagnosis not present

## 2022-04-14 DIAGNOSIS — C61 Malignant neoplasm of prostate: Secondary | ICD-10-CM | POA: Diagnosis not present

## 2022-04-14 DIAGNOSIS — R9721 Rising PSA following treatment for malignant neoplasm of prostate: Secondary | ICD-10-CM | POA: Diagnosis not present

## 2022-04-27 DIAGNOSIS — M1710 Unilateral primary osteoarthritis, unspecified knee: Secondary | ICD-10-CM | POA: Diagnosis not present

## 2022-04-27 DIAGNOSIS — F17200 Nicotine dependence, unspecified, uncomplicated: Secondary | ICD-10-CM | POA: Diagnosis not present

## 2022-04-27 DIAGNOSIS — J410 Simple chronic bronchitis: Secondary | ICD-10-CM | POA: Diagnosis not present

## 2022-04-27 DIAGNOSIS — I10 Essential (primary) hypertension: Secondary | ICD-10-CM | POA: Diagnosis not present

## 2022-06-22 DIAGNOSIS — C61 Malignant neoplasm of prostate: Secondary | ICD-10-CM | POA: Diagnosis not present

## 2022-07-21 DIAGNOSIS — Z1159 Encounter for screening for other viral diseases: Secondary | ICD-10-CM | POA: Diagnosis not present

## 2022-07-21 DIAGNOSIS — Z0001 Encounter for general adult medical examination with abnormal findings: Secondary | ICD-10-CM | POA: Diagnosis not present

## 2022-07-21 DIAGNOSIS — I10 Essential (primary) hypertension: Secondary | ICD-10-CM | POA: Diagnosis not present

## 2022-07-27 DIAGNOSIS — Z23 Encounter for immunization: Secondary | ICD-10-CM | POA: Diagnosis not present

## 2022-07-27 DIAGNOSIS — F17210 Nicotine dependence, cigarettes, uncomplicated: Secondary | ICD-10-CM | POA: Diagnosis not present

## 2022-07-27 DIAGNOSIS — J410 Simple chronic bronchitis: Secondary | ICD-10-CM | POA: Diagnosis not present

## 2022-07-27 DIAGNOSIS — M1710 Unilateral primary osteoarthritis, unspecified knee: Secondary | ICD-10-CM | POA: Diagnosis not present

## 2022-07-27 DIAGNOSIS — Z0001 Encounter for general adult medical examination with abnormal findings: Secondary | ICD-10-CM | POA: Diagnosis not present

## 2022-10-13 DIAGNOSIS — C61 Malignant neoplasm of prostate: Secondary | ICD-10-CM | POA: Diagnosis not present

## 2022-10-13 DIAGNOSIS — R9721 Rising PSA following treatment for malignant neoplasm of prostate: Secondary | ICD-10-CM | POA: Diagnosis not present

## 2022-10-26 DIAGNOSIS — R9721 Rising PSA following treatment for malignant neoplasm of prostate: Secondary | ICD-10-CM | POA: Diagnosis not present

## 2022-10-26 DIAGNOSIS — C61 Malignant neoplasm of prostate: Secondary | ICD-10-CM | POA: Diagnosis not present

## 2022-11-16 DIAGNOSIS — I10 Essential (primary) hypertension: Secondary | ICD-10-CM | POA: Diagnosis not present

## 2022-11-16 DIAGNOSIS — F17200 Nicotine dependence, unspecified, uncomplicated: Secondary | ICD-10-CM | POA: Diagnosis not present

## 2022-11-16 DIAGNOSIS — Z6830 Body mass index (BMI) 30.0-30.9, adult: Secondary | ICD-10-CM | POA: Diagnosis not present

## 2022-11-16 DIAGNOSIS — J410 Simple chronic bronchitis: Secondary | ICD-10-CM | POA: Diagnosis not present

## 2022-12-21 DIAGNOSIS — C61 Malignant neoplasm of prostate: Secondary | ICD-10-CM | POA: Diagnosis not present

## 2022-12-28 DIAGNOSIS — C61 Malignant neoplasm of prostate: Secondary | ICD-10-CM | POA: Diagnosis not present

## 2022-12-28 DIAGNOSIS — N393 Stress incontinence (female) (male): Secondary | ICD-10-CM | POA: Diagnosis not present

## 2023-01-18 DIAGNOSIS — R9721 Rising PSA following treatment for malignant neoplasm of prostate: Secondary | ICD-10-CM | POA: Diagnosis not present

## 2023-01-18 DIAGNOSIS — C61 Malignant neoplasm of prostate: Secondary | ICD-10-CM | POA: Diagnosis not present

## 2023-01-25 DIAGNOSIS — C61 Malignant neoplasm of prostate: Secondary | ICD-10-CM | POA: Diagnosis not present

## 2023-01-25 DIAGNOSIS — R9721 Rising PSA following treatment for malignant neoplasm of prostate: Secondary | ICD-10-CM | POA: Diagnosis not present

## 2023-02-17 DIAGNOSIS — I10 Essential (primary) hypertension: Secondary | ICD-10-CM | POA: Diagnosis not present

## 2023-02-17 DIAGNOSIS — M1710 Unilateral primary osteoarthritis, unspecified knee: Secondary | ICD-10-CM | POA: Diagnosis not present

## 2023-02-17 DIAGNOSIS — J410 Simple chronic bronchitis: Secondary | ICD-10-CM | POA: Diagnosis not present

## 2023-02-17 DIAGNOSIS — F17200 Nicotine dependence, unspecified, uncomplicated: Secondary | ICD-10-CM | POA: Diagnosis not present

## 2023-04-25 DIAGNOSIS — C61 Malignant neoplasm of prostate: Secondary | ICD-10-CM | POA: Diagnosis not present

## 2023-04-25 DIAGNOSIS — R9721 Rising PSA following treatment for malignant neoplasm of prostate: Secondary | ICD-10-CM | POA: Diagnosis not present

## 2023-04-26 DIAGNOSIS — R9721 Rising PSA following treatment for malignant neoplasm of prostate: Secondary | ICD-10-CM | POA: Diagnosis not present

## 2023-04-26 DIAGNOSIS — C61 Malignant neoplasm of prostate: Secondary | ICD-10-CM | POA: Diagnosis not present

## 2023-05-19 DIAGNOSIS — J410 Simple chronic bronchitis: Secondary | ICD-10-CM | POA: Diagnosis not present

## 2023-05-19 DIAGNOSIS — I10 Essential (primary) hypertension: Secondary | ICD-10-CM | POA: Diagnosis not present

## 2023-05-19 DIAGNOSIS — F17200 Nicotine dependence, unspecified, uncomplicated: Secondary | ICD-10-CM | POA: Diagnosis not present

## 2023-05-19 DIAGNOSIS — M1710 Unilateral primary osteoarthritis, unspecified knee: Secondary | ICD-10-CM | POA: Diagnosis not present

## 2023-06-21 DIAGNOSIS — C61 Malignant neoplasm of prostate: Secondary | ICD-10-CM | POA: Diagnosis not present

## 2023-06-28 DIAGNOSIS — R3121 Asymptomatic microscopic hematuria: Secondary | ICD-10-CM | POA: Diagnosis not present

## 2023-06-28 DIAGNOSIS — N5231 Erectile dysfunction following radical prostatectomy: Secondary | ICD-10-CM | POA: Diagnosis not present

## 2023-06-28 DIAGNOSIS — C61 Malignant neoplasm of prostate: Secondary | ICD-10-CM | POA: Diagnosis not present

## 2023-06-28 DIAGNOSIS — N393 Stress incontinence (female) (male): Secondary | ICD-10-CM | POA: Diagnosis not present

## 2023-07-03 DIAGNOSIS — R9721 Rising PSA following treatment for malignant neoplasm of prostate: Secondary | ICD-10-CM | POA: Diagnosis not present

## 2023-07-03 DIAGNOSIS — C61 Malignant neoplasm of prostate: Secondary | ICD-10-CM | POA: Diagnosis not present

## 2023-07-05 DIAGNOSIS — C61 Malignant neoplasm of prostate: Secondary | ICD-10-CM | POA: Diagnosis not present

## 2023-07-13 DIAGNOSIS — C61 Malignant neoplasm of prostate: Secondary | ICD-10-CM | POA: Diagnosis not present

## 2023-07-17 DIAGNOSIS — C61 Malignant neoplasm of prostate: Secondary | ICD-10-CM | POA: Diagnosis not present

## 2023-07-19 DIAGNOSIS — N3941 Urge incontinence: Secondary | ICD-10-CM | POA: Diagnosis not present

## 2023-07-19 DIAGNOSIS — Z0001 Encounter for general adult medical examination with abnormal findings: Secondary | ICD-10-CM | POA: Diagnosis not present

## 2023-07-19 DIAGNOSIS — M1710 Unilateral primary osteoarthritis, unspecified knee: Secondary | ICD-10-CM | POA: Diagnosis not present

## 2023-07-19 DIAGNOSIS — Z6830 Body mass index (BMI) 30.0-30.9, adult: Secondary | ICD-10-CM | POA: Diagnosis not present

## 2023-07-19 DIAGNOSIS — Z23 Encounter for immunization: Secondary | ICD-10-CM | POA: Diagnosis not present

## 2023-07-19 DIAGNOSIS — I10 Essential (primary) hypertension: Secondary | ICD-10-CM | POA: Diagnosis not present

## 2023-07-19 DIAGNOSIS — Z8546 Personal history of malignant neoplasm of prostate: Secondary | ICD-10-CM | POA: Diagnosis not present

## 2023-07-20 DIAGNOSIS — C61 Malignant neoplasm of prostate: Secondary | ICD-10-CM | POA: Diagnosis not present

## 2023-07-20 DIAGNOSIS — R9721 Rising PSA following treatment for malignant neoplasm of prostate: Secondary | ICD-10-CM | POA: Diagnosis not present

## 2023-07-21 DIAGNOSIS — C61 Malignant neoplasm of prostate: Secondary | ICD-10-CM | POA: Diagnosis not present

## 2023-07-21 DIAGNOSIS — R9721 Rising PSA following treatment for malignant neoplasm of prostate: Secondary | ICD-10-CM | POA: Diagnosis not present

## 2023-07-24 DIAGNOSIS — C61 Malignant neoplasm of prostate: Secondary | ICD-10-CM | POA: Diagnosis not present

## 2023-07-24 DIAGNOSIS — R9721 Rising PSA following treatment for malignant neoplasm of prostate: Secondary | ICD-10-CM | POA: Diagnosis not present

## 2023-07-25 DIAGNOSIS — C61 Malignant neoplasm of prostate: Secondary | ICD-10-CM | POA: Diagnosis not present

## 2023-07-25 DIAGNOSIS — R9721 Rising PSA following treatment for malignant neoplasm of prostate: Secondary | ICD-10-CM | POA: Diagnosis not present

## 2023-07-26 DIAGNOSIS — C61 Malignant neoplasm of prostate: Secondary | ICD-10-CM | POA: Diagnosis not present

## 2023-07-26 DIAGNOSIS — R9721 Rising PSA following treatment for malignant neoplasm of prostate: Secondary | ICD-10-CM | POA: Diagnosis not present

## 2023-07-27 DIAGNOSIS — C61 Malignant neoplasm of prostate: Secondary | ICD-10-CM | POA: Diagnosis not present

## 2023-07-27 DIAGNOSIS — R9721 Rising PSA following treatment for malignant neoplasm of prostate: Secondary | ICD-10-CM | POA: Diagnosis not present

## 2023-07-28 DIAGNOSIS — C61 Malignant neoplasm of prostate: Secondary | ICD-10-CM | POA: Diagnosis not present

## 2023-07-28 DIAGNOSIS — R9721 Rising PSA following treatment for malignant neoplasm of prostate: Secondary | ICD-10-CM | POA: Diagnosis not present

## 2023-07-31 DIAGNOSIS — C61 Malignant neoplasm of prostate: Secondary | ICD-10-CM | POA: Diagnosis not present

## 2023-07-31 DIAGNOSIS — R9721 Rising PSA following treatment for malignant neoplasm of prostate: Secondary | ICD-10-CM | POA: Diagnosis not present

## 2023-08-01 DIAGNOSIS — R9721 Rising PSA following treatment for malignant neoplasm of prostate: Secondary | ICD-10-CM | POA: Diagnosis not present

## 2023-08-01 DIAGNOSIS — C61 Malignant neoplasm of prostate: Secondary | ICD-10-CM | POA: Diagnosis not present

## 2023-08-02 DIAGNOSIS — C61 Malignant neoplasm of prostate: Secondary | ICD-10-CM | POA: Diagnosis not present

## 2023-08-02 DIAGNOSIS — R9721 Rising PSA following treatment for malignant neoplasm of prostate: Secondary | ICD-10-CM | POA: Diagnosis not present

## 2023-08-04 DIAGNOSIS — R9721 Rising PSA following treatment for malignant neoplasm of prostate: Secondary | ICD-10-CM | POA: Diagnosis not present

## 2023-08-04 DIAGNOSIS — C61 Malignant neoplasm of prostate: Secondary | ICD-10-CM | POA: Diagnosis not present

## 2023-08-07 DIAGNOSIS — C61 Malignant neoplasm of prostate: Secondary | ICD-10-CM | POA: Diagnosis not present

## 2023-08-07 DIAGNOSIS — R9721 Rising PSA following treatment for malignant neoplasm of prostate: Secondary | ICD-10-CM | POA: Diagnosis not present

## 2023-08-08 DIAGNOSIS — C61 Malignant neoplasm of prostate: Secondary | ICD-10-CM | POA: Diagnosis not present

## 2023-08-08 DIAGNOSIS — R9721 Rising PSA following treatment for malignant neoplasm of prostate: Secondary | ICD-10-CM | POA: Diagnosis not present

## 2023-08-09 DIAGNOSIS — C61 Malignant neoplasm of prostate: Secondary | ICD-10-CM | POA: Diagnosis not present

## 2023-08-09 DIAGNOSIS — R9721 Rising PSA following treatment for malignant neoplasm of prostate: Secondary | ICD-10-CM | POA: Diagnosis not present

## 2023-08-10 DIAGNOSIS — C61 Malignant neoplasm of prostate: Secondary | ICD-10-CM | POA: Diagnosis not present

## 2023-08-10 DIAGNOSIS — R9721 Rising PSA following treatment for malignant neoplasm of prostate: Secondary | ICD-10-CM | POA: Diagnosis not present

## 2023-08-11 DIAGNOSIS — C61 Malignant neoplasm of prostate: Secondary | ICD-10-CM | POA: Diagnosis not present

## 2023-08-11 DIAGNOSIS — R9721 Rising PSA following treatment for malignant neoplasm of prostate: Secondary | ICD-10-CM | POA: Diagnosis not present

## 2023-08-14 DIAGNOSIS — R9721 Rising PSA following treatment for malignant neoplasm of prostate: Secondary | ICD-10-CM | POA: Diagnosis not present

## 2023-08-14 DIAGNOSIS — C61 Malignant neoplasm of prostate: Secondary | ICD-10-CM | POA: Diagnosis not present

## 2023-08-15 DIAGNOSIS — R9721 Rising PSA following treatment for malignant neoplasm of prostate: Secondary | ICD-10-CM | POA: Diagnosis not present

## 2023-08-15 DIAGNOSIS — C61 Malignant neoplasm of prostate: Secondary | ICD-10-CM | POA: Diagnosis not present

## 2023-08-16 DIAGNOSIS — R9721 Rising PSA following treatment for malignant neoplasm of prostate: Secondary | ICD-10-CM | POA: Diagnosis not present

## 2023-08-16 DIAGNOSIS — N281 Cyst of kidney, acquired: Secondary | ICD-10-CM | POA: Diagnosis not present

## 2023-08-16 DIAGNOSIS — K7689 Other specified diseases of liver: Secondary | ICD-10-CM | POA: Diagnosis not present

## 2023-08-16 DIAGNOSIS — R3121 Asymptomatic microscopic hematuria: Secondary | ICD-10-CM | POA: Diagnosis not present

## 2023-08-16 DIAGNOSIS — C61 Malignant neoplasm of prostate: Secondary | ICD-10-CM | POA: Diagnosis not present

## 2023-08-16 DIAGNOSIS — R319 Hematuria, unspecified: Secondary | ICD-10-CM | POA: Diagnosis not present

## 2023-08-17 DIAGNOSIS — R9721 Rising PSA following treatment for malignant neoplasm of prostate: Secondary | ICD-10-CM | POA: Diagnosis not present

## 2023-08-17 DIAGNOSIS — C61 Malignant neoplasm of prostate: Secondary | ICD-10-CM | POA: Diagnosis not present

## 2023-08-18 DIAGNOSIS — C61 Malignant neoplasm of prostate: Secondary | ICD-10-CM | POA: Diagnosis not present

## 2023-08-18 DIAGNOSIS — R9721 Rising PSA following treatment for malignant neoplasm of prostate: Secondary | ICD-10-CM | POA: Diagnosis not present

## 2023-08-21 DIAGNOSIS — R9721 Rising PSA following treatment for malignant neoplasm of prostate: Secondary | ICD-10-CM | POA: Diagnosis not present

## 2023-08-21 DIAGNOSIS — C61 Malignant neoplasm of prostate: Secondary | ICD-10-CM | POA: Diagnosis not present

## 2023-08-22 DIAGNOSIS — R9721 Rising PSA following treatment for malignant neoplasm of prostate: Secondary | ICD-10-CM | POA: Diagnosis not present

## 2023-08-22 DIAGNOSIS — C61 Malignant neoplasm of prostate: Secondary | ICD-10-CM | POA: Diagnosis not present

## 2023-08-23 DIAGNOSIS — C61 Malignant neoplasm of prostate: Secondary | ICD-10-CM | POA: Diagnosis not present

## 2023-08-23 DIAGNOSIS — R9721 Rising PSA following treatment for malignant neoplasm of prostate: Secondary | ICD-10-CM | POA: Diagnosis not present

## 2023-08-24 DIAGNOSIS — R9721 Rising PSA following treatment for malignant neoplasm of prostate: Secondary | ICD-10-CM | POA: Diagnosis not present

## 2023-08-24 DIAGNOSIS — C61 Malignant neoplasm of prostate: Secondary | ICD-10-CM | POA: Diagnosis not present

## 2023-08-25 DIAGNOSIS — R9721 Rising PSA following treatment for malignant neoplasm of prostate: Secondary | ICD-10-CM | POA: Diagnosis not present

## 2023-08-25 DIAGNOSIS — C61 Malignant neoplasm of prostate: Secondary | ICD-10-CM | POA: Diagnosis not present

## 2023-08-29 DIAGNOSIS — C61 Malignant neoplasm of prostate: Secondary | ICD-10-CM | POA: Diagnosis not present

## 2023-08-29 DIAGNOSIS — R9721 Rising PSA following treatment for malignant neoplasm of prostate: Secondary | ICD-10-CM | POA: Diagnosis not present

## 2023-08-30 DIAGNOSIS — C61 Malignant neoplasm of prostate: Secondary | ICD-10-CM | POA: Diagnosis not present

## 2023-08-30 DIAGNOSIS — R9721 Rising PSA following treatment for malignant neoplasm of prostate: Secondary | ICD-10-CM | POA: Diagnosis not present

## 2023-08-31 DIAGNOSIS — C61 Malignant neoplasm of prostate: Secondary | ICD-10-CM | POA: Diagnosis not present

## 2023-08-31 DIAGNOSIS — R9721 Rising PSA following treatment for malignant neoplasm of prostate: Secondary | ICD-10-CM | POA: Diagnosis not present

## 2023-09-01 DIAGNOSIS — C61 Malignant neoplasm of prostate: Secondary | ICD-10-CM | POA: Diagnosis not present

## 2023-09-01 DIAGNOSIS — R9721 Rising PSA following treatment for malignant neoplasm of prostate: Secondary | ICD-10-CM | POA: Diagnosis not present

## 2023-09-04 DIAGNOSIS — R9721 Rising PSA following treatment for malignant neoplasm of prostate: Secondary | ICD-10-CM | POA: Diagnosis not present

## 2023-09-04 DIAGNOSIS — C61 Malignant neoplasm of prostate: Secondary | ICD-10-CM | POA: Diagnosis not present

## 2023-09-05 DIAGNOSIS — C61 Malignant neoplasm of prostate: Secondary | ICD-10-CM | POA: Diagnosis not present

## 2023-09-05 DIAGNOSIS — R9721 Rising PSA following treatment for malignant neoplasm of prostate: Secondary | ICD-10-CM | POA: Diagnosis not present

## 2023-09-06 DIAGNOSIS — R9721 Rising PSA following treatment for malignant neoplasm of prostate: Secondary | ICD-10-CM | POA: Diagnosis not present

## 2023-09-06 DIAGNOSIS — C61 Malignant neoplasm of prostate: Secondary | ICD-10-CM | POA: Diagnosis not present

## 2023-09-07 DIAGNOSIS — R9721 Rising PSA following treatment for malignant neoplasm of prostate: Secondary | ICD-10-CM | POA: Diagnosis not present

## 2023-09-07 DIAGNOSIS — C61 Malignant neoplasm of prostate: Secondary | ICD-10-CM | POA: Diagnosis not present

## 2023-09-08 DIAGNOSIS — C61 Malignant neoplasm of prostate: Secondary | ICD-10-CM | POA: Diagnosis not present

## 2023-09-08 DIAGNOSIS — R9721 Rising PSA following treatment for malignant neoplasm of prostate: Secondary | ICD-10-CM | POA: Diagnosis not present

## 2023-09-11 DIAGNOSIS — R9721 Rising PSA following treatment for malignant neoplasm of prostate: Secondary | ICD-10-CM | POA: Diagnosis not present

## 2023-09-11 DIAGNOSIS — C61 Malignant neoplasm of prostate: Secondary | ICD-10-CM | POA: Diagnosis not present

## 2023-09-12 DIAGNOSIS — C61 Malignant neoplasm of prostate: Secondary | ICD-10-CM | POA: Diagnosis not present

## 2023-09-12 DIAGNOSIS — R9721 Rising PSA following treatment for malignant neoplasm of prostate: Secondary | ICD-10-CM | POA: Diagnosis not present

## 2023-09-19 DIAGNOSIS — C61 Malignant neoplasm of prostate: Secondary | ICD-10-CM | POA: Diagnosis not present

## 2023-09-25 DIAGNOSIS — R3121 Asymptomatic microscopic hematuria: Secondary | ICD-10-CM | POA: Diagnosis not present

## 2023-09-25 DIAGNOSIS — N393 Stress incontinence (female) (male): Secondary | ICD-10-CM | POA: Diagnosis not present

## 2023-09-25 DIAGNOSIS — C61 Malignant neoplasm of prostate: Secondary | ICD-10-CM | POA: Diagnosis not present

## 2023-09-25 DIAGNOSIS — N200 Calculus of kidney: Secondary | ICD-10-CM | POA: Diagnosis not present

## 2023-10-09 DIAGNOSIS — N200 Calculus of kidney: Secondary | ICD-10-CM | POA: Diagnosis not present

## 2023-10-17 ENCOUNTER — Encounter: Payer: Self-pay | Admitting: Urology

## 2023-11-02 DIAGNOSIS — J410 Simple chronic bronchitis: Secondary | ICD-10-CM | POA: Diagnosis not present

## 2023-11-02 DIAGNOSIS — I10 Essential (primary) hypertension: Secondary | ICD-10-CM | POA: Diagnosis not present

## 2023-11-02 DIAGNOSIS — N3941 Urge incontinence: Secondary | ICD-10-CM | POA: Diagnosis not present

## 2023-11-02 DIAGNOSIS — F17210 Nicotine dependence, cigarettes, uncomplicated: Secondary | ICD-10-CM | POA: Diagnosis not present

## 2023-11-02 DIAGNOSIS — F17200 Nicotine dependence, unspecified, uncomplicated: Secondary | ICD-10-CM | POA: Diagnosis not present

## 2023-12-25 DIAGNOSIS — C61 Malignant neoplasm of prostate: Secondary | ICD-10-CM | POA: Diagnosis not present

## 2024-01-01 DIAGNOSIS — N200 Calculus of kidney: Secondary | ICD-10-CM | POA: Diagnosis not present

## 2024-01-01 DIAGNOSIS — C61 Malignant neoplasm of prostate: Secondary | ICD-10-CM | POA: Diagnosis not present

## 2024-01-01 DIAGNOSIS — N393 Stress incontinence (female) (male): Secondary | ICD-10-CM | POA: Diagnosis not present

## 2024-01-01 DIAGNOSIS — N5231 Erectile dysfunction following radical prostatectomy: Secondary | ICD-10-CM | POA: Diagnosis not present

## 2024-01-23 DIAGNOSIS — F17200 Nicotine dependence, unspecified, uncomplicated: Secondary | ICD-10-CM | POA: Diagnosis not present

## 2024-01-23 DIAGNOSIS — I10 Essential (primary) hypertension: Secondary | ICD-10-CM | POA: Diagnosis not present

## 2024-01-23 DIAGNOSIS — F17210 Nicotine dependence, cigarettes, uncomplicated: Secondary | ICD-10-CM | POA: Diagnosis not present

## 2024-01-23 DIAGNOSIS — J410 Simple chronic bronchitis: Secondary | ICD-10-CM | POA: Diagnosis not present

## 2024-02-28 ENCOUNTER — Encounter: Payer: Self-pay | Admitting: *Deleted

## 2024-03-26 DIAGNOSIS — C61 Malignant neoplasm of prostate: Secondary | ICD-10-CM | POA: Diagnosis not present

## 2024-04-02 DIAGNOSIS — K7689 Other specified diseases of liver: Secondary | ICD-10-CM | POA: Diagnosis not present

## 2024-04-02 DIAGNOSIS — K5730 Diverticulosis of large intestine without perforation or abscess without bleeding: Secondary | ICD-10-CM | POA: Diagnosis not present

## 2024-04-02 DIAGNOSIS — N200 Calculus of kidney: Secondary | ICD-10-CM | POA: Diagnosis not present

## 2024-04-02 DIAGNOSIS — C61 Malignant neoplasm of prostate: Secondary | ICD-10-CM | POA: Diagnosis not present

## 2024-04-09 DIAGNOSIS — C61 Malignant neoplasm of prostate: Secondary | ICD-10-CM | POA: Diagnosis not present

## 2024-04-17 DIAGNOSIS — N200 Calculus of kidney: Secondary | ICD-10-CM | POA: Diagnosis not present

## 2024-04-17 DIAGNOSIS — N393 Stress incontinence (female) (male): Secondary | ICD-10-CM | POA: Diagnosis not present

## 2024-04-17 DIAGNOSIS — Z8546 Personal history of malignant neoplasm of prostate: Secondary | ICD-10-CM | POA: Diagnosis not present

## 2024-05-06 NOTE — Progress Notes (Signed)
 GI Office Note    Referring Provider: Carlette Benita Area* Primary Care Physician:  Fanta, Tesfaye Demissie, MD  Primary Gastroenterologist: Carlin POUR. Cindie, DO   Chief Complaint   Chief Complaint  Patient presents with   Follow-up    Follow up on bleeding in stool---occasionally with wiping. Pt also here for visit before colonoscopy     History of Present Illness   Dustin [name] is a 73 y.o. male presenting today for consideration of colonoscopy.  Patient's last colonoscopy was in June 2020 at which time he had four 3 to 4 mm polyps in the sigmoid and hepatic flexure removed.  Moderate diverticulosis.  External/internal hemorrhoids.  Pathology revealed hyperplastic polyps.  Given prior history of adenomatous colon polyps, patient was recommended to repeat colonoscopy in 5 years.  We received office notes from [facility] urology including CT as outlined below.  CT urogram 04/02/2024: Impression: 1.  Nonobstructive staghorn type 12 millimeter right lower pole renal stone 2.  Questionable asymmetric anterior rectal wall thickening, consider correlation with direct visualization 3.  Colonic diverticulosis without evidence of acute bowel inflammation  Today: potentially having right kidney stone removed with Dr. Watt, [facility]. Saw some gross hematuria recently.   Occasional some brbpr with wiping. Has had hemorrhoids surgery in past and does not want that again.   37 radiations treatments in 08/2023 for history of prostate cancer with rising PSA.   BM regular. Goes every day. No rectal pain. No abdominal pain. No heartburn. No n/v. Good appetite.   Continues to work full time.  Medications   Current Outpatient Medications  Medication Sig Dispense Refill   acetaminophen  (TYLENOL ) 500 MG tablet Take 1,000 mg by mouth in the morning and at bedtime.     hydrochlorothiazide  (HYDRODIURIL ) 12.5 MG tablet Take 12.5 mg by mouth daily.     losartan  (COZAAR ) 100 MG  tablet Take 100 mg by mouth daily.     naphazoline-pheniramine (VISINE) 0.025-0.3 % ophthalmic solution Place 1 drop into both eyes daily at 12 noon.     potassium citrate (UROCIT-K) 10 MEQ (1080 MG) SR tablet Take 10 mEq by mouth 3 (three) times daily with meals.     OVER THE COUNTER MEDICATION Take 1 tablet by mouth daily. Omega XL     No current facility-administered medications for this visit.    Allergies   Allergies as of 05/07/2024   (No Known Allergies)    Past Medical History   Past Medical History:  Diagnosis Date   Arthritis    Cancer (HCC)    prostate cancer with radical prostatectomy in 2015. biochemical recurrence, completed radiation in 08/2023.   Colon polyps    HTN (hypertension)    Joint pain     Past Surgical History   Past Surgical History:  Procedure Laterality Date   COLONOSCOPY  10/07/2008   DOQ:Floupeoz colorectal polyps/Small internal hemorrhoids. multiple simple adenomas.   COLONOSCOPY N/A 11/26/2013   Procedure: COLONOSCOPY;  Surgeon: Margo LITTIE Haddock, MD;  [facility];  Service: Endoscopy;  Laterality: N/A;  10:15AM   COLONOSCOPY N/A 04/05/2019   Procedure: COLONOSCOPY;  Surgeon: Haddock Margo LITTIE, MD;  [facility];  Service: Endoscopy;  Laterality: N/A;  8:30   KNEE ARTHROSCOPY WITH MEDIAL MENISECTOMY Right 06/11/2019   Procedure: RIGHT KNEE ARTHROSCOPY WITH PARTIAL MEDIAL MENISECTOMY;  Surgeon: Margrette Taft BRAVO, MD;  Location: [facility];  Service: Orthopedics;  Laterality: Right;   KNEE ARTHROSCOPY WITH MEDIAL MENISECTOMY Left  05/14/2021   Procedure: LEFT KNEE ARTHROSCOPY WITH MEDIAL MENISCECTOMY;  Surgeon: Margrette Taft BRAVO, MD;  Location: [facility];  Service: Orthopedics;  Laterality: Left;   LYMPH NODE DISSECTION Bilateral 10/01/2014   Procedure: LYMPH NODE DISSECTION;  Surgeon: Norleen JINNY Seltzer, MD;  Location: [facility];  Service: Urology;  Laterality: Bilateral;   POLYPECTOMY  04/05/2019   Procedure: POLYPECTOMY;  Surgeon: Harvey Margo CROME, MD;  [facility];  Service: Endoscopy;;  hepatic flexure, sigmoid x3   RECTAL SURGERY  2010   Ziegler: necrotic hemorrhoidal tissue   ROBOT ASSISTED LAPAROSCOPIC RADICAL PROSTATECTOMY N/A 10/01/2014   Procedure: ROBOTIC ASSISTED LAPAROSCOPIC RADICAL PROSTATECTOMY;  Surgeon: Norleen JINNY Seltzer, MD;  [facility];  Service: Urology;  Laterality: N/A;   UMBILICAL HERNIA REPAIR N/A 10/01/2014   Procedure: HERNIA REPAIR UMBILICAL ADULT;  Surgeon: Lynda Leos, MD;  [facility];  Service: General;  Laterality: N/A;    Past Family History   Family History  Problem Relation Age of Onset   Colon cancer Neg Hx     Past Social History   Social History   Socioeconomic History   Marital status: Married    Spouse name: Not on file   Number of children: 0   Years of education: Not on file   Highest education level: Not on file  Occupational History   Not on file  Tobacco Use   Smoking status: Every Day    Current packs/day: 0.50    Average packs/day: 0.5 packs/day for 40.0 years (20.0 ttl pk-yrs)    Types: Cigarettes   Smokeless tobacco: Never  Vaping Use   Vaping status: Never Used  Substance and Sexual Activity   Alcohol use: Yes    Comment: weekends   Drug use: No   Sexual activity: Yes    Birth control/protection: None  Other Topics Concern   Not on file  Social History Narrative   Not on file   Social Drivers of Health   Financial Resource Strain: Not on file  Food Insecurity: No Food Insecurity (04/09/2024)   Received from [facility]   Hunger Vital Sign    Within the past 12 months, you worried that your food would run out before you got the money to buy more.: Never true    Within the past 12 months, the food you bought just didn't last and you didn't have money to get more.: Never true  Transportation Needs: No Transportation Needs (04/09/2024)   Received from [facility]   PRAPARE - Transportation    Lack of Transportation  (Medical): No    Lack of Transportation (Non-Medical): No  Physical Activity: Not on file  Stress: Not on file  Social Connections: Not on file  Intimate Partner Violence: Not on file    Review of Systems   General: Negative for anorexia, weight loss, fever, chills, fatigue, weakness. Eyes: Negative for vision changes.  ENT: Negative for hoarseness, difficulty swallowing , nasal congestion. CV: Negative for chest pain, angina, palpitations, dyspnea on exertion, peripheral edema.  Respiratory: Negative for dyspnea at rest, dyspnea on exertion, cough, sputum, wheezing.  GI: See history of present illness. GU:  Negative for dysuria, hematuria,   urinary frequency, nocturnal urination. Some urinary incontinence with prior prostate surgery MS: Negative for joint pain, low back pain.  Derm: Negative for rash or itching.  Neuro: Negative for weakness, abnormal sensation, seizure, frequent headaches, memory loss,  confusion.  Psych: Negative for anxiety, depression, suicidal ideation, hallucinations.  Endo: Negative for unusual weight change.  Heme: Negative for bruising or bleeding. Allergy: Negative for rash or hives.  Physical Exam   BP 135/81   Pulse 67   Temp 98.5 F (36.9 C)   Ht 5' 9 (1.753 m)   Wt 206 lb 6.4 oz (93.6 kg)   BMI 30.48 kg/m    General: Well-nourished, well-developed in no acute distress.  Head: Normocephalic, atraumatic.   Eyes: Conjunctiva pink, no icterus. Mouth: Oropharyngeal mucosa moist and pink  Neck: Supple without thyromegaly, masses, or lymphadenopathy.  Lungs: Clear to auscultation bilaterally.  Heart: Regular rate and rhythm, no murmurs rubs or gallops.  Abdomen: Bowel sounds are normal, nontender, nondistended, no hepatosplenomegaly or masses,  no abdominal bruits or hernia, no rebound or guarding.   Rectal: not performed Extremities: No lower extremity edema. No clubbing or deformities.  Neuro: Alert and oriented x 4 , grossly normal  neurologically.  Skin: Warm and dry, no rash or jaundice.   Psych: Alert and cooperative, normal mood and affect.  Labs   None available. Imaging Studies   No results found.  Assessment/Plan:   H/O adenomatous colon polyps/question of abnormal anterior rectal wall on recent CT/occasional rectal bleeding:  -colonoscopy with Dr. Cindie. ASA 2.  I have discussed the risks, alternatives, benefits with regards to but not limited to the risk of reaction to medication, bleeding, infection, perforation and the patient is agreeable to proceed. Written consent to be obtained.    Sonny RAMAN. Ezzard, MHS, PA-C [facility]

## 2024-05-07 ENCOUNTER — Encounter: Payer: Self-pay | Admitting: Gastroenterology

## 2024-05-07 ENCOUNTER — Other Ambulatory Visit: Payer: Self-pay

## 2024-05-07 ENCOUNTER — Telehealth: Payer: Self-pay | Admitting: *Deleted

## 2024-05-07 ENCOUNTER — Ambulatory Visit (INDEPENDENT_AMBULATORY_CARE_PROVIDER_SITE_OTHER): Admitting: Gastroenterology

## 2024-05-07 VITALS — BP 135/81 | HR 67 | Temp 98.5°F | Ht 69.0 in | Wt 206.4 lb

## 2024-05-07 DIAGNOSIS — R933 Abnormal findings on diagnostic imaging of other parts of digestive tract: Secondary | ICD-10-CM | POA: Diagnosis not present

## 2024-05-07 DIAGNOSIS — K629 Disease of anus and rectum, unspecified: Secondary | ICD-10-CM

## 2024-05-07 DIAGNOSIS — Z860100 Personal history of colon polyps, unspecified: Secondary | ICD-10-CM

## 2024-05-07 DIAGNOSIS — K625 Hemorrhage of anus and rectum: Secondary | ICD-10-CM

## 2024-05-07 DIAGNOSIS — Z860101 Personal history of adenomatous and serrated colon polyps: Secondary | ICD-10-CM | POA: Diagnosis not present

## 2024-05-07 NOTE — Patient Instructions (Signed)
 Colonoscopy to be scheduled. Separate instructions to be provided.

## 2024-05-07 NOTE — Telephone Encounter (Signed)
 ATC patient, VM full Needs TCS with Dr. Cindie, ASA 2, BMET prior

## 2024-05-09 ENCOUNTER — Other Ambulatory Visit: Payer: Self-pay | Admitting: Urology

## 2024-05-10 ENCOUNTER — Other Ambulatory Visit (HOSPITAL_COMMUNITY): Payer: Self-pay | Admitting: Urology

## 2024-05-10 DIAGNOSIS — N200 Calculus of kidney: Secondary | ICD-10-CM

## 2024-05-13 NOTE — Telephone Encounter (Signed)
 ATC pt, no answer and VM still full

## 2024-05-14 DIAGNOSIS — F17200 Nicotine dependence, unspecified, uncomplicated: Secondary | ICD-10-CM | POA: Diagnosis not present

## 2024-05-14 DIAGNOSIS — F17210 Nicotine dependence, cigarettes, uncomplicated: Secondary | ICD-10-CM | POA: Diagnosis not present

## 2024-05-14 DIAGNOSIS — J410 Simple chronic bronchitis: Secondary | ICD-10-CM | POA: Diagnosis not present

## 2024-05-14 DIAGNOSIS — I10 Essential (primary) hypertension: Secondary | ICD-10-CM | POA: Diagnosis not present

## 2024-05-14 DIAGNOSIS — Z6830 Body mass index (BMI) 30.0-30.9, adult: Secondary | ICD-10-CM | POA: Diagnosis not present

## 2024-05-20 NOTE — Telephone Encounter (Signed)
 Pt called in and left VM to schedule procedure  Called back and no answer and still not able to leave VM

## 2024-05-21 NOTE — Telephone Encounter (Signed)
 Received VM from pt, called back and no answer and VM full on mobile Called home # and LMOVM

## 2024-05-24 NOTE — Telephone Encounter (Signed)
Called pt, LMOVM to call back. 

## 2024-05-27 MED ORDER — PEG 3350-KCL-NA BICARB-NACL 420 G PO SOLR: 4000.0000 mL | Freq: Once | ORAL | 0 refills | Status: AC

## 2024-05-27 NOTE — Addendum Note (Signed)
 Addended by: JEANELL GRAEME RAMAN on: 05/27/2024 10:20 AM   Modules accepted: Orders

## 2024-05-27 NOTE — Telephone Encounter (Signed)
 Pt called back. Scheduled for 9/4. Aware will send instructions to him. Rx for prep to the pharmacy.

## 2024-06-10 ENCOUNTER — Other Ambulatory Visit (HOSPITAL_COMMUNITY)
Admission: RE | Admit: 2024-06-10 | Discharge: 2024-06-10 | Disposition: A | Discharge status: Home / Self Care | Source: Ambulatory Visit | Attending: Internal Medicine | Admitting: Internal Medicine

## 2024-06-10 DIAGNOSIS — K625 Hemorrhage of anus and rectum: Secondary | ICD-10-CM | POA: Insufficient documentation

## 2024-06-10 DIAGNOSIS — Z860100 Personal history of colon polyps, unspecified: Secondary | ICD-10-CM | POA: Insufficient documentation

## 2024-06-10 DIAGNOSIS — K629 Disease of anus and rectum, unspecified: Secondary | ICD-10-CM | POA: Diagnosis not present

## 2024-06-10 LAB — BASIC METABOLIC PANEL WITH GFR
Anion gap: 7 (ref 5–15)
BUN: 16 mg/dL (ref 8–23)
CO2: 26 mmol/L (ref 22–32)
Calcium: 8.9 mg/dL (ref 8.9–10.3)
Chloride: 104 mmol/L (ref 98–111)
Creatinine, Ser: 1.01 mg/dL (ref 0.61–1.24)
GFR, Estimated: >60 mL/min (ref >60)
Glucose, Bld: 109 mg/dL — ABNORMAL HIGH (ref 70–99)
Potassium: 3.5 mmol/L (ref 3.5–5.1)
Sodium: 137 mmol/L (ref 135–145)

## 2024-06-19 NOTE — Anesthesia Preprocedure Evaluation (Signed)
 Anesthesia Evaluation  Patient identified by MRN, date of birth, ID band Patient awake    Reviewed: Allergy & Precautions, H&P , NPO status , Patient's Chart, lab work & pertinent test results, reviewed documented beta blocker date and time   Airway Mallampati: II  TM Distance: >3 FB Neck ROM: full    Dental no notable dental hx. (+) Dental Advisory Given, Teeth Intact   Pulmonary Current Smoker and Patient abstained from smoking.   Pulmonary exam normal breath sounds clear to auscultation       Cardiovascular hypertension, Normal cardiovascular exam Rhythm:regular Rate:Normal     Neuro/Psych negative neurological ROS  negative psych ROS   GI/Hepatic negative GI ROS, Neg liver ROS,,,  Endo/Other  negative endocrine ROS    Renal/GU negative Renal ROS  negative genitourinary   Musculoskeletal  (+) Arthritis , Osteoarthritis,    Abdominal   Peds  Hematology negative hematology ROS (+)   Anesthesia Other Findings   Reproductive/Obstetrics negative OB ROS                              Anesthesia Physical Anesthesia Plan  ASA: 2  Anesthesia Plan: General   Post-op Pain Management: Minimal or no pain anticipated   Induction: Intravenous  PONV Risk Score and Plan: Propofol  infusion  Airway Management Planned: Natural Airway and Nasal Cannula  Additional Equipment: None  Intra-op Plan:   Post-operative Plan:   Informed Consent: I have reviewed the patients History and Physical, chart, labs and discussed the procedure including the risks, benefits and alternatives for the proposed anesthesia with the patient or authorized representative who has indicated his/her understanding and acceptance.     Dental Advisory Given  Plan Discussed with: CRNA  Anesthesia Plan Comments:          Anesthesia Quick Evaluation

## 2024-06-20 ENCOUNTER — Encounter (HOSPITAL_COMMUNITY)
Admission: RE | Disposition: A | Discharge status: Home / Self Care | Payer: Self-pay | Source: Home / Self Care | Attending: Internal Medicine

## 2024-06-20 ENCOUNTER — Encounter (HOSPITAL_COMMUNITY): Payer: Self-pay | Admitting: Internal Medicine

## 2024-06-20 ENCOUNTER — Ambulatory Visit (HOSPITAL_COMMUNITY): Payer: Self-pay | Admitting: Anesthesiology

## 2024-06-20 ENCOUNTER — Ambulatory Visit (HOSPITAL_COMMUNITY)
Admission: RE | Admit: 2024-06-20 | Discharge: 2024-06-20 | Disposition: A | Discharge status: Home / Self Care | Attending: Internal Medicine | Admitting: Internal Medicine

## 2024-06-20 ENCOUNTER — Other Ambulatory Visit: Payer: Self-pay

## 2024-06-20 ENCOUNTER — Encounter (HOSPITAL_COMMUNITY): Payer: Self-pay | Admitting: Anesthesiology

## 2024-06-20 DIAGNOSIS — D123 Benign neoplasm of transverse colon: Secondary | ICD-10-CM | POA: Insufficient documentation

## 2024-06-20 DIAGNOSIS — K5730 Diverticulosis of large intestine without perforation or abscess without bleeding: Secondary | ICD-10-CM | POA: Diagnosis not present

## 2024-06-20 DIAGNOSIS — K635 Polyp of colon: Secondary | ICD-10-CM

## 2024-06-20 DIAGNOSIS — D122 Benign neoplasm of ascending colon: Secondary | ICD-10-CM | POA: Insufficient documentation

## 2024-06-20 DIAGNOSIS — Z1211 Encounter for screening for malignant neoplasm of colon: Secondary | ICD-10-CM | POA: Diagnosis not present

## 2024-06-20 DIAGNOSIS — I10 Essential (primary) hypertension: Secondary | ICD-10-CM | POA: Diagnosis not present

## 2024-06-20 DIAGNOSIS — F17210 Nicotine dependence, cigarettes, uncomplicated: Secondary | ICD-10-CM | POA: Diagnosis not present

## 2024-06-20 DIAGNOSIS — K648 Other hemorrhoids: Secondary | ICD-10-CM | POA: Diagnosis not present

## 2024-06-20 DIAGNOSIS — D125 Benign neoplasm of sigmoid colon: Secondary | ICD-10-CM | POA: Diagnosis not present

## 2024-06-20 DIAGNOSIS — F17200 Nicotine dependence, unspecified, uncomplicated: Secondary | ICD-10-CM | POA: Diagnosis not present

## 2024-06-20 DIAGNOSIS — Z860101 Personal history of adenomatous and serrated colon polyps: Secondary | ICD-10-CM

## 2024-06-20 HISTORY — PX: COLONOSCOPY: SHX5424

## 2024-06-20 SURGERY — COLONOSCOPY
Anesthesia: General

## 2024-06-20 MED ORDER — PROPOFOL 10 MG/ML IV BOLUS
INTRAVENOUS | Status: DC | PRN
Start: 2024-06-20 — End: 2024-06-20
  Administered 2024-06-20: 125 ug/kg/min via INTRAVENOUS
  Administered 2024-06-20: 80 mg via INTRAVENOUS

## 2024-06-20 MED ORDER — LACTATED RINGERS IV SOLN: INTRAVENOUS | Status: DC

## 2024-06-20 MED ORDER — SIMETHICONE 40 MG/0.6ML PO SUSP
ORAL | Status: DC | PRN
  Administered 2024-06-20: 60 mL

## 2024-06-20 MED ORDER — LIDOCAINE 2% (20 MG/ML) 5 ML SYRINGE
INTRAMUSCULAR | Status: DC | PRN
  Administered 2024-06-20: 60 mg via INTRAVENOUS

## 2024-06-20 NOTE — Op Note (Signed)
 [facility] Patient Name: Dustin [name] Procedure Date: 06/20/2024 7:46 AM MRN: [id number] Date of Birth: 06-19-1951 Attending MD: Carlin POUR. Cindie , [place], [id number] CSN: [id number] Age: 73 Admit Type: Outpatient Procedure:                Colonoscopy Indications:              Surveillance: Personal history of adenomatous                            polyps on last colonoscopy 5 years ago Providers:                Carlin POUR. Cindie, DO, Tammy Vaught, RN, Devere Lodge Referring MD:              Medicines:                See the Anesthesia note for documentation of the                            administered medications Complications:            No immediate complications. Estimated Blood Loss:     Estimated blood loss was minimal. Procedure:                Pre-Anesthesia Assessment:                           - The anesthesia plan was to use monitored                            anesthesia care (MAC).                           After obtaining informed consent, the colonoscope                            was passed under direct vision. Throughout the                            procedure, the patient's blood pressure, pulse, and                            oxygen saturations were monitored continuously. The                            PCF-HQ190L ([id number]) Peds Colon was introduced                            through the anus and advanced to the the cecum,                            identified by appendiceal orifice and ileocecal                            valve. The colonoscopy was performed without  difficulty. The patient tolerated the procedure                            well. The quality of the bowel preparation was                            evaluated using the BBPS [facility] Bowel Preparation                            Scale) with scores of: Right Colon = 3, Transverse                            Colon = 3 and Left Colon = 3 (entire mucosa seen                             well with no residual staining, small fragments of                            stool or opaque liquid). The total BBPS score                            equals 9. Scope In: 7:57:10 AM Scope Out: 8:12:57 AM Scope Withdrawal Time: 0 hours 13 minutes 37 seconds  Total Procedure Duration: 0 hours 15 minutes 47 seconds  Findings:      Non-bleeding internal hemorrhoids were found during retroflexion. The       hemorrhoids were moderate.      Multiple large-mouthed and small-mouthed diverticula were found in the       entire colon.      A 2 mm polyp was found in the ascending colon. The polyp was sessile.       The polyp was removed with a cold biopsy forceps. Resection and       retrieval were complete.      Four sessile polyps were found in the transverse colon. The polyps were       2 to 7 mm in size. These polyps were removed with a cold snare (1 with       cold forceps). Resection and retrieval were complete.      A 5 mm polyp was found in the sigmoid colon. The polyp was sessile. The       polyp was removed with a cold snare. Resection and retrieval were       complete. Impression:               - Non-bleeding internal hemorrhoids.                           - Diverticulosis in the entire examined colon.                           - One 2 mm polyp in the ascending colon, removed                            with a cold biopsy forceps. Resected and retrieved.                           -  Four 2 to 7 mm polyps in the transverse colon,                            removed with a cold snare. Resected and retrieved.                           - One 5 mm polyp in the sigmoid colon, removed with                            a cold snare. Resected and retrieved. Moderate Sedation:      Per Anesthesia Care Recommendation:           - Patient has a contact number available for                            emergencies. The signs and symptoms of potential                            delayed complications  were discussed with the                            patient. Return to normal activities tomorrow.                            Written discharge instructions were provided to the                            patient.                           - Resume previous diet.                           - Continue present medications.                           - Await pathology results.                           - Repeat colonoscopy in 3-5 years depending on                            pathology results for surveillance.                           - Return to GI clinic in 3 months. Procedure Code(s):        --- Professional ---                           [phone], Colonoscopy, flexible; with removal of                            tumor(s), polyp(s), or other lesion(s) by snare  technique                           [id number], 59, Colonoscopy, flexible; with biopsy,                            single or multiple Diagnosis Code(s):        --- Professional ---                           Z86.010, Personal history of colonic polyps                           K64.8, Other hemorrhoids                           D12.2, Benign neoplasm of ascending colon                           D12.5, Benign neoplasm of sigmoid colon                           D12.3, Benign neoplasm of transverse colon (hepatic                            flexure or splenic flexure)                           K57.30, Diverticulosis of large intestine without                            perforation or abscess without bleeding CPT copyright 2022 American Medical Association. All rights reserved. The codes documented in this report are preliminary and upon coder review may  be revised to meet current compliance requirements. Carlin POUR. Cindie, DO Carlin POUR. Cindie, DO 06/20/2024 8:18:06 AM This report has been signed electronically. Number of Addenda: 0

## 2024-06-20 NOTE — H&P (Signed)
 Primary Care Physician:  Carlette Benita Area, MD Primary Gastroenterologist:  Dr. Cindie  Pre-Procedure History & Physical: HPI:  Dustin [name] is a 73 y.o. male is here for a colonoscopy to be performed for surveillance purposes, personal history of adenomatous colon polyps   Past Medical History:  Diagnosis Date   Arthritis    Cancer [facility])    prostate cancer with radical prostatectomy in 2015. biochemical recurrence, completed radiation in 08/2023.   Colon polyps    HTN (hypertension)    Joint pain     Past Surgical History:  Procedure Laterality Date   COLONOSCOPY  10/07/2008   DOQ:Floupeoz colorectal polyps/Small internal hemorrhoids. multiple simple adenomas.   COLONOSCOPY N/A 11/26/2013   Procedure: COLONOSCOPY;  Surgeon: Margo LITTIE Haddock, MD;  [facility];  Service: Endoscopy;  Laterality: N/A;  10:15AM   COLONOSCOPY N/A 04/05/2019   Procedure: COLONOSCOPY;  Surgeon: Haddock Margo LITTIE, MD;  [facility];  Service: Endoscopy;  Laterality: N/A;  8:30   KNEE ARTHROSCOPY WITH MEDIAL MENISECTOMY Right 06/11/2019   Procedure: RIGHT KNEE ARTHROSCOPY WITH PARTIAL MEDIAL MENISECTOMY;  Surgeon: Margrette Taft BRAVO, MD;  [facility]: [facility];  Service: Orthopedics;  Laterality: Right;   KNEE ARTHROSCOPY WITH MEDIAL MENISECTOMY Left 05/14/2021   Procedure: LEFT KNEE ARTHROSCOPY WITH MEDIAL MENISCECTOMY;  Surgeon: Margrette Taft BRAVO, MD;  Location: [facility];  Service: Orthopedics;  Laterality: Left;   LYMPH NODE DISSECTION Bilateral 10/01/2014   Procedure: LYMPH NODE DISSECTION;  Surgeon: Norleen JINNY Seltzer, MD;  [facility];  Service: Urology;  Laterality: Bilateral;   POLYPECTOMY  04/05/2019   Procedure: POLYPECTOMY;  Surgeon: Haddock Margo LITTIE, MD;  [facility];  Service: Endoscopy;;  hepatic flexure, sigmoid x3   RECTAL SURGERY  2010   Ziegler: necrotic hemorrhoidal tissue   ROBOT ASSISTED LAPAROSCOPIC RADICAL PROSTATECTOMY N/A 10/01/2014   Procedure: ROBOTIC  ASSISTED LAPAROSCOPIC RADICAL PROSTATECTOMY;  Surgeon: Norleen JINNY Seltzer, MD;  [facility];  Service: Urology;  Laterality: N/A;   UMBILICAL HERNIA REPAIR N/A 10/01/2014   Procedure: HERNIA REPAIR UMBILICAL ADULT;  Surgeon: Lynda Leos, MD;  [facility]: [facility];  Service: General;  Laterality: N/A;    Prior to Admission medications   Medication Sig Start Date End Date Taking? Authorizing Provider  acetaminophen  (TYLENOL ) 500 MG tablet Take 1,000 mg by mouth in the morning and at bedtime.   Yes [provider]  hydrochlorothiazide  (HYDRODIURIL ) 12.5 MG tablet Take 12.5 mg by mouth daily. 04/01/19  Yes [provider]  losartan  (COZAAR ) 100 MG tablet Take 100 mg by mouth daily. 01/02/19  Yes [provider]  naphazoline-pheniramine (VISINE) 0.025-0.3 % ophthalmic solution Place 1 drop into both eyes daily at 12 noon.   Yes [provider]  OVER THE COUNTER MEDICATION Take 1 tablet by mouth daily. Omega XL   Yes [provider]  potassium citrate (UROCIT-K) 10 MEQ (1080 MG) SR tablet Take 10 mEq by mouth 3 (three) times daily with meals.   Yes [provider]    Allergies as of 06/12/2024   (No Known Allergies)    Family History  Problem Relation Age of Onset   Colon cancer Neg Hx     Social History   Socioeconomic History   Marital status: Married    Spouse name: Not on file   Number of children: 0   Years of education: Not on file   Highest education level: Not on file  Occupational History   Not  on file  Tobacco Use   Smoking status: Every Day    Current packs/day: 0.50    Average packs/day: 0.5 packs/day for 40.0 years (20.0 ttl pk-yrs)    Types: Cigarettes   Smokeless tobacco: Never  Vaping Use   Vaping status: Never Used  Substance and Sexual Activity   Alcohol use: Yes    Comment: weekends   Drug use: No   Sexual activity: Yes    Birth control/protection: None  Other Topics Concern   Not on file  Social History  Narrative   Not on file   Social Drivers of Health   Financial Resource Strain: Not on file  Food Insecurity: No Food Insecurity (04/09/2024)   Received from [facility]   Hunger Vital Sign    Within the past 12 months, you worried that your food would run out before you got the money to buy more.: Never true    Within the past 12 months, the food you bought just didn't last and you didn't have money to get more.: Never true  Transportation Needs: No Transportation Needs (04/09/2024)   Received from [facility]   PRAPARE - Transportation    Lack of Transportation (Medical): No    Lack of Transportation (Non-Medical): No  Physical Activity: Not on file  Stress: Not on file  Social Connections: Not on file  Intimate Partner Violence: Not on file    Review of Systems: See HPI, otherwise negative ROS  Physical Exam: Vital signs in last 24 hours: Temp:  [98.2 F (36.8 C)] 98.2 F (36.8 C) (09/04 0717) Pulse Rate:  [60] 60 (09/04 0717) Resp:  [18] 18 (09/04 0717) BP: (135)/(73) 135/73 (09/04 0717) SpO2:  [95 %] 95 % (09/04 0717) Weight:  [94.3 kg] 94.3 kg (09/04 0717)   General:   Alert,  Well-developed, well-nourished, pleasant and cooperative in NAD Head:  Normocephalic and atraumatic. Eyes:  Sclera clear, no icterus.   Conjunctiva pink. Ears:  Normal auditory acuity. Nose:  No deformity, discharge,  or lesions. Msk:  Symmetrical without gross deformities. Normal posture. Extremities:  Without clubbing or edema. Neurologic:  Alert and  oriented x4;  grossly normal neurologically. Skin:  Intact without significant lesions or rashes. Psych:  Alert and cooperative. Normal mood and affect.  Impression/Plan: Dustin [name] is here for a colonoscopy to be performed for surveillance purposes, personal history of adenomatous colon polyps   The risks of the procedure including infection, bleed, or perforation as well as benefits, limitations, alternatives and imponderables  have been reviewed with the patient. Questions have been answered. All parties agreeable.

## 2024-06-20 NOTE — Anesthesia Postprocedure Evaluation (Signed)
 Anesthesia Post Note  Patient: Dustin [name]  Procedure(s) Performed: COLONOSCOPY  Patient location during evaluation: Endoscopy Anesthesia Type: General Level of consciousness: awake and alert Pain management: pain level controlled Vital Signs Assessment: post-procedure vital signs reviewed and stable Respiratory status: spontaneous breathing, nonlabored ventilation and respiratory function stable Cardiovascular status: stable Anesthetic complications: no   There were no known notable events for this encounter.   Last Vitals:  Vitals:   06/20/24 0717 06/20/24 0818  BP: 135/73 (!) 96/45  Pulse: 60 74  Resp: 18 19  Temp: 36.8 C (!) 36.4 C  SpO2: 95% 93%    Last Pain:  Vitals:   06/20/24 0818  TempSrc: Oral  PainSc: 0-No pain                 Dustin [name]

## 2024-06-20 NOTE — Anesthesia Procedure Notes (Signed)
 Date/Time: 06/20/2024 7:53 AM  Performed by: Para Jerelene CROME, CRNAOxygen Delivery Method: Nasal cannula

## 2024-06-20 NOTE — Transfer of Care (Addendum)
 Immediate Anesthesia Transfer of Care Note  Patient: Dustin [name]  Procedure(s) Performed: COLONOSCOPY  Patient Location: Endoscopy Unit  Anesthesia Type:General  Level of Consciousness: drowsy and patient cooperative  Airway & Oxygen Therapy: Patient Spontanous Breathing  Post-op Assessment: Report given to RN and Post -op Vital signs reviewed and stable  Post vital signs: Reviewed and stable  Last Vitals:  Vitals Value Taken Time  BP 96/45 06/20/24   0818  Temp 36.4 06/20/24   0818  Pulse 74 06/20/24   0818  Resp 19 06/20/24   0818  SpO2 93% 06/20/24   0818    Last Pain:  Vitals:   06/20/24 0752  TempSrc:   PainSc: 5       Patients Stated Pain Goal: 6 (06/20/24 0717)  Complications: No notable events documented.

## 2024-06-20 NOTE — Discharge Instructions (Addendum)
  Colonoscopy Discharge Instructions  Read the instructions outlined below and refer to this sheet in the next few weeks. These discharge instructions provide you with general information on caring for yourself after you leave the hospital. Your doctor may also give you specific instructions. While your treatment has been planned according to the most current medical practices available, unavoidable complications occasionally occur.   ACTIVITY You may resume your regular activity, but move at a slower pace for the next 24 hours.  Take frequent rest periods for the next 24 hours.  Walking will help get rid of the air and reduce the bloated feeling in your belly (abdomen).  No driving for 24 hours (because of the medicine (anesthesia) used during the test).   Do not sign any important legal documents or operate any machinery for 24 hours (because of the anesthesia used during the test).  NUTRITION Drink plenty of fluids.  You may resume your normal diet as instructed by your doctor.  Begin with a light meal and progress to your normal diet. Heavy or fried foods are harder to digest and may make you feel sick to your stomach (nauseated).  Avoid alcoholic beverages for 24 hours or as instructed.  MEDICATIONS You may resume your normal medications unless your doctor tells you otherwise.  WHAT YOU CAN EXPECT TODAY Some feelings of bloating in the abdomen.  Passage of more gas than usual.  Spotting of blood in your stool or on the toilet paper.  IF YOU HAD POLYPS REMOVED DURING THE COLONOSCOPY: No aspirin products for 7 days or as instructed.  No alcohol for 7 days or as instructed.  Eat a soft diet for the next 24 hours.  FINDING OUT THE RESULTS OF YOUR TEST Not all test results are available during your visit. If your test results are not back during the visit, make an appointment with your caregiver to find out the results. Do not assume everything is normal if you have not heard from your  caregiver or the medical facility. It is important for you to follow up on all of your test results.  SEEK IMMEDIATE MEDICAL ATTENTION IF: You have more than a spotting of blood in your stool.  Your belly is swollen (abdominal distention).  You are nauseated or vomiting.  You have a temperature over 101.  You have abdominal pain or discomfort that is severe or gets worse throughout the day.   Your colonoscopy revealed 6 polyp(s) which I removed successfully. Await pathology results, my office will contact you. I recommend repeating colonoscopy in 3-5 years for surveillance purposes depending on pathology results.   You also have diverticulosis and internal hemorrhoids. I would recommend increasing fiber in your diet or adding OTC Benefiber/Metamucil. Be sure to drink at least 4 to 6 glasses of water  daily. Follow-up with GI in 3 months. Message sent to the office and will schedule an appointment with you.    I hope you have a great rest of your week!  Dustin [name]. Cindie, D.O. Gastroenterology and Hepatology [facility]

## 2024-06-21 LAB — SURGICAL PATHOLOGY

## 2024-06-25 ENCOUNTER — Encounter (HOSPITAL_COMMUNITY): Payer: Self-pay | Admitting: Internal Medicine

## 2024-07-03 ENCOUNTER — Ambulatory Visit: Payer: Self-pay | Admitting: Internal Medicine

## 2024-07-05 NOTE — Patient Instructions (Addendum)
 SURGICAL WAITING ROOM VISITATION  Patients having surgery or a procedure may have no more than 2 support people in the waiting area - these visitors may rotate.    Children under the age of 39 must have an adult with them who is not the patient.  Visitors with respiratory illnesses are discouraged from visiting and should remain at home.  If the patient needs to stay at the hospital during part of their recovery, the visitor guidelines for inpatient rooms apply. Pre-op nurse will coordinate an appropriate time for 1 support person to accompany patient in pre-op.  This support person may not rotate.    Please refer to the [facility] website for the visitor guidelines for Inpatients (after your surgery is over and you are in a regular room).       Your procedure is scheduled on: 07/16/24   Report to [facility] Main Entrance    Report to admitting at 7:30 AM   Call this number if you have problems the morning of surgery [phone]   Do not eat food or drink liquids :After Midnight.but may have sips of water  to take meds.     DENTURES WILL BE REMOVED PRIOR TO SURGERY PLEASE DO NOT APPLY Poly grip OR ADHESIVES!!!   Do NOT smoke after Midnight   Stop all vitamins and herbal supplements 7 days before surgery.   Take these medicines the morning of surgery with A SIP OF WATER : tylenol                Do not take Hydrodiuril (hydrochlorothiazide ) or Cozaar (Losartan ) the morning of surgery.              You may not have any metal on your body including hair pins, jewelry, and body piercing             Do not wear make-up, lotions, powders, perfumes/cologne, or deodorant              Men may shave face and neck.   Do not bring valuables to the hospital. [facility] IS NOT             RESPONSIBLE   FOR VALUABLES.   Contacts, glasses, dentures or bridgework may not be worn into surgery.   Bring small overnight bag day of surgery.   DO NOT BRING YOUR HOME MEDICATIONS TO  THE HOSPITAL. PHARMACY WILL DISPENSE MEDICATIONS LISTED ON YOUR MEDICATION LIST TO YOU DURING YOUR ADMISSION IN THE HOSPITAL!    Patients discharged on the day of surgery will not be allowed to drive home.  Someone NEEDS to stay with you for the first 24 hours after anesthesia.   Special Instructions: Bring a copy of your healthcare power of attorney and living will documents the day of surgery if you haven't scanned them before.              Please read over the following fact sheets you were given: IF YOU HAVE QUESTIONS ABOUT YOUR PRE-OP INSTRUCTIONS PLEASE CALL [phone] Dustin [name]   If you received a COVID test during your pre-op visit  it is requested that you wear a mask when out in public, stay away from anyone that may not be feeling well and notify your surgeon if you develop symptoms. If you test positive for Covid or have been in contact with anyone that has tested positive in the last 10 days please notify you surgeon.    [facility] - Preparing for Surgery Before surgery, you can play  an important role.  Because skin is not sterile, your skin needs to be as free of germs as possible.  You can reduce the number of germs on your skin by washing with CHG (chlorahexidine gluconate) soap before surgery.  CHG is an antiseptic cleaner which kills germs and bonds with the skin to continue killing germs even after washing. Please DO NOT use if you have an allergy to CHG or antibacterial soaps.  If your skin becomes reddened/irritated stop using the CHG and inform your nurse when you arrive at Short Stay. Do not shave (including legs and underarms) for at least 48 hours prior to the first CHG shower.  You may shave your face/neck.  Please follow these instructions carefully:  1.  Shower with CHG Soap the night before surgery and the  morning of surgery.  2.  If you choose to wash your hair, wash your hair first as usual with your normal  shampoo.  3.  After you shampoo, rinse your hair and body  thoroughly to remove the shampoo.                             4.  Use CHG as you would any other liquid soap.  You can apply chg directly to the skin and wash.  Gently with a scrungie or clean washcloth.  5.  Apply the CHG Soap to your body ONLY FROM THE NECK DOWN.   Do   not use on face/ open                           Wound or open sores. Avoid contact with eyes, ears mouth and   genitals (private parts).                       Wash face,  Genitals (private parts) with your normal soap.             6.  Wash thoroughly, paying special attention to the area where your    surgery  will be performed.  7.  Thoroughly rinse your body with warm water  from the neck down.  8.  DO NOT shower/wash with your normal soap after using and rinsing off the CHG Soap.                9.  Pat yourself dry with a clean towel.            10.  Wear clean pajamas.            11.  Place clean sheets on your bed the night of your first shower and do not  sleep with pets. Day of Surgery : Do not apply any lotions/deodorants the morning of surgery.  Please wear clean clothes to the hospital/surgery center.  FAILURE TO FOLLOW THESE INSTRUCTIONS MAY RESULT IN THE CANCELLATION OF YOUR SURGERY  ________________________________________________________________________ WHAT IS A BLOOD TRANSFUSION? Blood Transfusion Information  A transfusion is the replacement of blood or some of its parts. Blood is made up of multiple cells which provide different functions. Red blood cells carry oxygen and are used for blood loss replacement. White blood cells fight against infection. Platelets control bleeding. Plasma helps clot blood. Other blood products are available for specialized needs, such as hemophilia or other clotting disorders. BEFORE THE TRANSFUSION  Who gives blood for transfusions?  Healthy volunteers who are fully evaluated to  make sure their blood is safe. This is blood bank blood. Transfusion therapy is the safest it  has ever been in the practice of medicine. Before blood is taken from a donor, a complete history is taken to make sure that person has no history of diseases nor engages in risky social behavior (examples are intravenous drug use or sexual activity with multiple partners). The donor's travel history is screened to minimize risk of transmitting infections, such as malaria. The donated blood is tested for signs of infectious diseases, such as HIV and hepatitis. The blood is then tested to be sure it is compatible with you in order to minimize the chance of a transfusion reaction. If you or a relative donates blood, this is often done in anticipation of surgery and is not appropriate for emergency situations. It takes many days to process the donated blood. RISKS AND COMPLICATIONS Although transfusion therapy is very safe and saves many lives, the main dangers of transfusion include:  Getting an infectious disease. Developing a transfusion reaction. This is an allergic reaction to something in the blood you were given. Every precaution is taken to prevent this. The decision to have a blood transfusion has been considered carefully by your caregiver before blood is given. Blood is not given unless the benefits outweigh the risks. AFTER THE TRANSFUSION Right after receiving a blood transfusion, you will usually feel much better and more energetic. This is especially true if your red blood cells have gotten low (anemic). The transfusion raises the level of the red blood cells which carry oxygen, and this usually causes an energy increase. The nurse administering the transfusion will monitor you carefully for complications. HOME CARE INSTRUCTIONS  No special instructions are needed after a transfusion. You may find your energy is better. Speak with your caregiver about any limitations on activity for underlying diseases you may have. SEEK MEDICAL CARE IF:  Your condition is not improving after your  transfusion. You develop redness or irritation at the intravenous (IV) site. SEEK IMMEDIATE MEDICAL CARE IF:  Any of the following symptoms occur over the next 12 hours: Shaking chills. You have a temperature by mouth above 102 F (38.9 C), not controlled by medicine. Chest, back, or muscle pain. People around you feel you are not acting correctly or are confused. Shortness of breath or difficulty breathing. Dizziness and fainting. You get a rash or develop hives. You have a decrease in urine output. Your urine turns a dark color or changes to pink, red, or brown. Any of the following symptoms occur over the next 10 days: You have a temperature by mouth above 102 F (38.9 C), not controlled by medicine. Shortness of breath. Weakness after normal activity. The white part of the eye turns yellow (jaundice). You have a decrease in the amount of urine or are urinating less often. Your urine turns a dark color or changes to pink, red, or brown. Document Released: 09/30/2000 Document Revised: 12/26/2011 Document Reviewed: 05/19/2008 [facility] Patient Information 2014 [place], [place].

## 2024-07-05 NOTE — Progress Notes (Addendum)
 COVID Vaccine received:  []  No [x]  Yes Date of any COVID positive Test in last 90 days: no PCP - Benita Outhouse MD Cardiologist - n/a  Chest x-ray -  EKG -   Stress Test -  ECHO -  Cardiac Cath -   Bowel Prep - [x]  No  []   Yes ______  Pacemaker / ICD device [x]  No []  Yes   Spinal Cord Stimulator:[x]  No []  Yes       History of Sleep Apnea? [x]  No []  Yes   CPAP used?- [x]  No []  Yes    Does the patient monitor blood sugar?          [x]  No []  Yes  []  N/A  Patient has: [x]  NO Hx DM   []  Pre-DM                 []  DM1  []   DM2 Does patient have a [employer] or [employer]? []  No []  Yes   Fasting Blood Sugar Ranges-  Checks Blood Sugar _____ times a day  GLP1 agonist / usual dose - no GLP1 instructions:  SGLT-2 inhibitors / usual dose - no SGLT-2 instructions:   Blood Thinner / Instructions:no Aspirin Instructions:no  Comments:   Activity level: Patient is able /to climb a flight of stairs without difficulty; [x]  No CP  [x]  No SOB,    Patient can perform ADLs without assistance.   Anesthesia review: Abnl. EKG  Patient denies shortness of breath, fever, cough and chest pain at PAT appointment.  Patient verbalized understanding and agreement to the Pre-Surgical Instructions that were given to them at this PAT appointment. Patient was also educated of the need to review these PAT instructions again prior to his/her surgery.I reviewed the appropriate phone numbers to call if they have any and questions or concerns.

## 2024-07-08 ENCOUNTER — Encounter (HOSPITAL_COMMUNITY)
Admission: RE | Admit: 2024-07-08 | Discharge: 2024-07-08 | Disposition: A | Discharge status: Home / Self Care | Source: Ambulatory Visit | Attending: Urology | Admitting: Urology

## 2024-07-08 ENCOUNTER — Encounter (HOSPITAL_COMMUNITY): Payer: Self-pay

## 2024-07-08 ENCOUNTER — Other Ambulatory Visit: Payer: Self-pay

## 2024-07-08 VITALS — BP 158/90 | HR 70 | Temp 98.0°F | Resp 18 | Ht 69.0 in | Wt 204.0 lb

## 2024-07-08 DIAGNOSIS — I10 Essential (primary) hypertension: Secondary | ICD-10-CM | POA: Diagnosis not present

## 2024-07-08 DIAGNOSIS — R3121 Asymptomatic microscopic hematuria: Secondary | ICD-10-CM | POA: Diagnosis not present

## 2024-07-08 DIAGNOSIS — R9431 Abnormal electrocardiogram [ECG] [EKG]: Secondary | ICD-10-CM | POA: Diagnosis not present

## 2024-07-08 DIAGNOSIS — R311 Benign essential microscopic hematuria: Secondary | ICD-10-CM | POA: Diagnosis not present

## 2024-07-08 DIAGNOSIS — Z01818 Encounter for other preprocedural examination: Secondary | ICD-10-CM | POA: Diagnosis not present

## 2024-07-08 DIAGNOSIS — N200 Calculus of kidney: Secondary | ICD-10-CM | POA: Diagnosis not present

## 2024-07-08 DIAGNOSIS — Z01810 Encounter for preprocedural cardiovascular examination: Secondary | ICD-10-CM | POA: Diagnosis not present

## 2024-07-08 DIAGNOSIS — Z01812 Encounter for preprocedural laboratory examination: Secondary | ICD-10-CM | POA: Diagnosis not present

## 2024-07-08 HISTORY — DX: Personal history of urinary calculi: Z87.442

## 2024-07-08 LAB — CBC
HCT: 43.9 % (ref 39.0–52.0)
Hemoglobin: 14 g/dL (ref 13.0–17.0)
MCH: 28.2 pg (ref 26.0–34.0)
MCHC: 31.9 g/dL (ref 30.0–36.0)
MCV: 88.5 fL (ref 80.0–100.0)
Platelets: 257 K/uL (ref 150–400)
RBC: 4.96 MIL/uL (ref 4.22–5.81)
RDW: 12.7 % (ref 11.5–15.5)
WBC: 6 K/uL (ref 4.0–10.5)
nRBC: 0 % (ref 0.0–0.2)

## 2024-07-08 LAB — BASIC METABOLIC PANEL WITH GFR
Anion gap: 12 (ref 5–15)
BUN: 16 mg/dL (ref 8–23)
CO2: 25 mmol/L (ref 22–32)
Calcium: 9.7 mg/dL (ref 8.9–10.3)
Chloride: 103 mmol/L (ref 98–111)
Creatinine, Ser: 0.95 mg/dL (ref 0.61–1.24)
GFR, Estimated: >60 mL/min (ref >60)
Glucose, Bld: 103 mg/dL — ABNORMAL HIGH (ref 70–99)
Potassium: 3.3 mmol/L — ABNORMAL LOW (ref 3.5–5.1)
Sodium: 139 mmol/L (ref 135–145)

## 2024-07-15 ENCOUNTER — Other Ambulatory Visit: Payer: Self-pay | Admitting: Radiology

## 2024-07-15 DIAGNOSIS — N200 Calculus of kidney: Secondary | ICD-10-CM

## 2024-07-15 NOTE — H&P (Signed)
 Chief Complaint: Large right renal stone; referred for right percutaneous nephrostomy/nephroureteral catheter placement prior to nephrolithotomy  Referring Provider(s): Watt PARAS  Supervising Physician: Hughes Simmonds  Patient Status: [facility] - Out-pt  History of Present Illness: Dustin [name] is a 73 y.o. male smoker with past medical history significant for arthritis, prostate cancer with prior prostatectomy 2015, colon polyps, hypertension, and nephrolithiasis with prior imaging revealing nonobstructive staghorn type 12 mm right lower pole renal stone.  He presents today for right percutaneous nephrostomy/nephroureteral catheter placement prior to nephrolithotomy.  *** Patient is Full Code  Past Medical History:  Diagnosis Date   Arthritis    Cancer (HCC)    prostate cancer with radical prostatectomy in 2015. biochemical recurrence, completed radiation in 08/2023.   Colon polyps    History of kidney stones    HTN (hypertension)    Joint pain     Past Surgical History:  Procedure Laterality Date   COLONOSCOPY  10/07/2008   DOQ:Floupeoz colorectal polyps/Small internal hemorrhoids. multiple simple adenomas.   COLONOSCOPY N/A 11/26/2013   Procedure: COLONOSCOPY;  Surgeon: Margo LITTIE Haddock, MD;  Location[facility];  Service: Endoscopy;  Laterality: N/A;  10:15AM   COLONOSCOPY N/A 04/05/2019   Procedure: COLONOSCOPY;  Surgeon: Haddock Margo LITTIE, MD;  Location: [facility];  Service: Endoscopy;  Laterality: N/A;  8:30   COLONOSCOPY N/A 06/20/2024   Procedure: COLONOSCOPY;  Surgeon: Cindie Carlin POUR, DO;  Location: [facility];  Service: Endoscopy;  Laterality: N/A;  :800AM, ASA 2   KNEE ARTHROSCOPY WITH MEDIAL MENISECTOMY Right 06/11/2019   Procedure: RIGHT KNEE ARTHROSCOPY WITH PARTIAL MEDIAL MENISECTOMY;  Surgeon: Margrette Taft BRAVO, MD;  Location: [facility] ORS;  Service: Orthopedics;  Laterality: Right;   KNEE ARTHROSCOPY WITH MEDIAL MENISECTOMY Left 05/14/2021   Procedure: LEFT KNEE  ARTHROSCOPY WITH MEDIAL MENISCECTOMY;  Surgeon: Margrette Taft BRAVO, MD;  Location: [facility];  Service: Orthopedics;  Laterality: Left;   LYMPH NODE DISSECTION Bilateral 10/01/2014   Procedure: LYMPH NODE DISSECTION;  Surgeon: Norleen PARAS Watt, MD;  Location: [facility];  Service: Urology;  Laterality: Bilateral;   POLYPECTOMY  04/05/2019   Procedure: POLYPECTOMY;  Surgeon: Haddock Margo LITTIE, MD;  Location: [facility];  Service: Endoscopy;;  hepatic flexure, sigmoid x3   RECTAL SURGERY  2010   Ziegler: necrotic hemorrhoidal tissue   ROBOT ASSISTED LAPAROSCOPIC RADICAL PROSTATECTOMY N/A 10/01/2014   Procedure: ROBOTIC ASSISTED LAPAROSCOPIC RADICAL PROSTATECTOMY;  Surgeon: Norleen PARAS Watt, MD;  Location: [facility];  Service: Urology;  Laterality: N/A;   UMBILICAL HERNIA REPAIR N/A 10/01/2014   Procedure: HERNIA REPAIR UMBILICAL ADULT;  Surgeon: Lynda Leos, MD;  Location: [facility];  Service: General;  Laterality: N/A;    Allergies: Patient has no known allergies.  Medications: Prior to Admission medications   Medication Sig Start Date End Date Taking? Authorizing Provider  acetaminophen  (TYLENOL ) 650 MG CR tablet Take 1,300 mg by mouth every 8 (eight) hours as needed for pain.   Yes [provider]  hydrochlorothiazide  (HYDRODIURIL ) 12.5 MG tablet Take 12.5 mg by mouth daily. 04/01/19  Yes [provider]  losartan  (COZAAR ) 100 MG tablet Take 100 mg by mouth daily. 01/02/19  Yes [provider]  naphazoline-pheniramine (VISINE) 0.025-0.3 % ophthalmic solution Place 1 drop into both eyes 4 (four) times daily as needed for eye irritation.   Yes [provider]     Family History  Problem Relation Age of Onset   Colon cancer Neg Hx  Social History   Socioeconomic History   Marital status: Widowed    Spouse name: Not on file   Number of children: 0   Years of education: Not on file   Highest education level: Not on file  Occupational History   Not on file   Tobacco Use   Smoking status: Every Day    Current packs/day: 0.50    Average packs/day: 0.5 packs/day for 40.0 years (20.0 ttl pk-yrs)    Types: Cigarettes   Smokeless tobacco: Never  Vaping Use   Vaping status: Never Used  Substance and Sexual Activity   Alcohol use: Yes    Comment: weekends   Drug use: No   Sexual activity: Yes    Birth control/protection: None  Other Topics Concern   Not on file  Social History Narrative   Not on file   Social Drivers of Health   Financial Resource Strain: Not on file  Food Insecurity: No Food Insecurity (04/09/2024)   [facility] from [facility]   Hunger Vital Sign    Within the past 12 months, you worried that your food would run out before you got the money to buy more.: Never true    Within the past 12 months, the food you bought just didn't last and you didn't have money to get more.: Never true  Transportation Needs: No Transportation Needs (04/09/2024)   Received from [facility]   PRAPARE - Transportation    Lack of Transportation (Medical): No    Lack of Transportation (Non-Medical): No  Physical Activity: Not on file  Stress: Not on file  Social Connections: Not on file       Review of Systems  Vital Signs:   Advance Care Plan: No documents on file  Physical Exam  Imaging: No results found.  Labs:  CBC: Recent Labs    07/08/24 1027  WBC 6.0  HGB 14.0  HCT 43.9  PLT 257    COAGS: No results for input(s): INR, APTT in the last 8760 hours.  BMP: Recent Labs    06/10/24 1042 07/08/24 1027  NA 137 139  K 3.5 3.3*  CL 104 103  CO2 26 25  GLUCOSE 109* 103*  BUN 16 16  CALCIUM  8.9 9.7  CREATININE 1.01 0.95  GFRNONAA >60 >60    LIVER FUNCTION TESTS: No results for input(s): BILITOT, AST, ALT, ALKPHOS, PROT, ALBUMIN in the last 8760 hours.  TUMOR MARKERS: No results for input(s): AFPTM, CEA, CA199, CHROMGRNA in the last 8760 hours.  Assessment and Plan: 73 y.o.  male smoker with past medical history significant for arthritis, prostate cancer with prior prostatectomy 2015, colon polyps, hypertension, and nephrolithiasis with prior imaging revealing nonobstructive staghorn type 12 mm right lower pole renal stone.  He presents today for right percutaneous nephrostomy/nephroureteral catheter placement prior to nephrolithotomy.Risks and benefits of right PCN placement was discussed with the patient including, but not limited to, infection, bleeding, significant bleeding causing loss or decrease in renal function or damage to adjacent structures.   All of the patient's questions were answered, patient is agreeable to proceed.  Consent signed and in chart.      Thank you for allowing our service to participate in Dustin [name] 's care.  Electronically Signed: D. Franky Rakers, PA-C   07/15/2024, 1:09 PM      I spent a total of  25 minutes   in face to face in clinical consultation, greater than 50% of which was counseling/coordinating care  for right percutaneous nephrostomy/nephroureteral catheter placement

## 2024-07-15 NOTE — H&P (Signed)
 cc: Preoperative appointment prior to right PCNL   HPI: 73 year old man who has a large right renal stone is scheduled for right PCNL on 07/16/2024. He feels well prepared and has had all of his questions were answered. He denies dysuria, urgency, gross hematuria. He has a slight cold and reports that he has a slight cough as well but denies shortness of breath, chest pain, or wheezing. He denies any changes to his medications. He denies fevers and chills.     ALLERGIES: No Known Drug Allergies    MEDICATIONS: Allergy  Ibuprofen   Losartan  Potassium-HCTZ 100-12.5 MG Tablet  Potassium Citrate ER 10 MEQ (1080 MG) Tablet Extended Release 1 tablet PO TID  Sildenafil Citrate 20 MG Tablet 1-2 tablet PO Daily PRN     GU PSH: Laparoscopy; Lymphadenectomy - 2015 Locm 300-399Mg /Ml Iodine , - 08/16/2023 Robotic Radical Prostatectomy - 2015       PSH Notes: Laparoscopy With Bilateral Total Pelvic Lymphadenectomy, Prostatect Retropubic Radical W/ Nerve Sparing Laparoscopic, Hemorrhoidectomy   NON-GU PSH: Colonoscopy - 06/24/2024 Hemorrhoidectomy (favorite) - 2015 Knee Arthroscopy/surgery - about 2022 Visit Complexity (formerly GPC1X) - 04/17/2024, 01/01/2024, 09/25/2023, 06/28/2023, 2024     GU PMH: History of prostate cancer (Stable), His PSA is down further after SBRT. Repeat in 6 months. - 04/17/2024, His PSA was up at his last visit. I will repeat that today and if there is a further increase, I will consider restaging with a PMSA PET. He will otherwise return in 6months with a PSA for an exam. , - 2022, History of prostate cancer, - 2016 Renal calculus, His RLP stone is now 3cm in greatest dimension and minimal radioopaque. I discussed ESWL, URS and PCNL and think he will be best served with PCNL. . I reviewed the risks of bleeding, infection, injury to the kidney and adjacent structures, renal loss, need for secondary procedures, urine leaks, thrombotic events and anesthetic complications. I will  get him scheduled. - 04/17/2024, - 04/02/2024, The stone appears to be marginally larger on RUS but it is hard to compare with the CT. He has no concerning symptoms. I discussed options including ESWL if the stone is now visible on KUB, URS or observation with a CT in 3 months. he will stay on the potassium citrate and return in 3 months with a CT. , - 01/01/2024, He has a new 1.5cm stone in the RLP as noted above. , - 09/25/2023 Stress Incontinence, He has increased SUI but it is improving. - 04/17/2024, - 01/01/2024, He incontinence worsened with the radiaiton treatment but has returned to baseline. , - 09/25/2023, Stable at 2 pads per day., - 06/28/2023, He is using 2ppd but is content with that form of management. , - 2024, He continue to use 2ppd. , - 2023, He has stable 2ppd incontinence. , - 2022, He continues to use about 2ppd, - 2022, Male stress incontinence, - 2016 ED following radical prostatectomy, He continues to respond to sildenafil. - 01/01/2024, Not sexually active but does respond to sildenafil when he takes it., - 06/28/2023, He continues to respond to sildenafil but doesn't need a refill. , - 2024, He is interested in trying meds again and was dispensed sildenafil 20mg  with instruction for use and a review of the side effects and precautions. , - 2023, He has postprostatectomy ED but just lost his wife and has not been active. , - 2022, He continues to use the VED. , - 2022 (Stable), He has not improved. I discussed the VED, injections  and IPP. He was given a pamphlet for the VED. , - 2018 Prostate Cancer, His PSA is now following after salvage RTx. repeat in 3 months. - 01/01/2024, He just completed salvage RTx in the last month. It is a bit early to see a response and his PSA is up slightly from pretreatment. I will recheck it in 3 months. , - 09/25/2023, - 08/16/2023, - 06/28/2023, His PSA is minimally changed. Continue 6 month PSA f/u. , - 2024, - 2023, He has D0 recurrent disease with a PSADT of 1  year. I discussed options for therapy and will get him set up to see Rad Onc at [facility] for consideration of salvage therapy. I reviewed the side effects of therapy. , - 2023, His PSA has about doubled in the past 12 months. I am going to have him return in 6 months with a PSA and will consider a PSMA PET if there is a further increase. , - 2022, - 2021, PSA remains undetectable., - 2017, Adenocarcinoma of prostate, - 2016 Microscopic hematuria, He has 3-10 RBC's on UA today and a metabolically active radiolucent RLP stone that is 1.5cm and wasn't present a year ago. The density is most consistent with uric acid. I will get a hypercalciuria profile today and start him on potassium citrate 10meq tid. Side effects reviewed. I will recheck a potassium in 2 weeks and have him return in 6 with a renal US . If the stone doesn't regress on the potassium citrate, we will need to consider ureteroscopy. I didn't cysto him today because he just finished RTx and he may need ureteroscopy in the future. - 09/25/2023, - 08/16/2023, - 06/28/2023 Rising PSA after prostate cancer treatment - 09/25/2023, - 06/28/2023, - 2024, - 2023, - 2023, - 2022, - 2022, - 2020 Inflammatory disorder of unspecified male genital organ - 2021 Male ED, unspecified, Erectile dysfunction - 2016 Elevated PSA, PSA elevation - 2016    NON-GU PMH: Encounter for general adult medical examination without abnormal findings, Encounter for preventive health examination - 2016 Umbilical hernia without obstruction or gangrene, Umbilical hernia - 2016 Personal history of other diseases of the circulatory system, History of hypertension - 2015 Personal history of other diseases of the musculoskeletal system and connective tissue, History of arthritis - 2015    FAMILY HISTORY: cardiac disorder - Runs In Family Deceased - Runs In Family Hypertension - Runs In Family Lung Cancer - Runs In Family   SOCIAL HISTORY: Marital Status: Married Preferred Language:  English; Ethnicity: Not Hispanic Or Latino; Race: Black or African American Current Smoking Status: Patient smokes. Has smoked since 07/18/1971. Smokes 1/2 pack per day.  Social Drinker.  Drinks 1 caffeinated drink per day. Patient's occupation is/was Works- [employer].    REVIEW OF SYSTEMS:    GU Review Male:   Patient reports hard to postpone urination, get up at night to urinate, and leakage of urine. Patient denies frequent urination, burning/ pain with urination, stream starts and stops, trouble starting your stream, have to strain to urinate , erection problems, and penile pain.  Gastrointestinal (Upper):   Patient denies nausea, vomiting, and indigestion/ heartburn.  Gastrointestinal (Lower):   Patient denies diarrhea and constipation.  Constitutional:   Patient denies fever, night sweats, weight loss, and fatigue.  Skin:   Patient denies skin rash/ lesion and itching.  Eyes:   Patient denies blurred vision and double vision.  Ears/ Nose/ Throat:   Patient denies sore throat and sinus problems.  Hematologic/Lymphatic:  Patient denies swollen glands and easy bruising.  Cardiovascular:   Patient denies leg swelling and chest pains.  Respiratory:   Patient denies cough and shortness of breath.  Endocrine:   Patient denies excessive thirst.  Musculoskeletal:   Patient denies back pain and joint pain.  Neurological:   Patient denies dizziness and headaches.  Psychologic:   Patient denies depression and anxiety.   VITAL SIGNS:      07/08/2024 11:25 AM  BP 152/85 mmHg  Pulse 60 /min  Temperature 98.6 F / 37 C   MULTI-SYSTEM PHYSICAL EXAMINATION:    Constitutional: Well-nourished. No physical deformities. Normally developed. Good grooming.  Respiratory: Normal breath sounds. No labored breathing, no use of accessory muscles. slight cough without wheezing or rales  Cardiovascular: Regular rate and rhythm. No murmur, no gallop. Normal temperature, normal extremity pulses, no  swelling, no varicosities.   Skin: No paleness, no jaundice, no cyanosis. No lesion, no ulcer, no rash.  Neurologic / Psychiatric: Oriented to time, oriented to place, oriented to person. No depression, no anxiety, no agitation.  Gastrointestinal: No mass, no tenderness, no rigidity, non obese abdomen.  Eyes: Normal conjunctivae. Normal eyelids.  Musculoskeletal: Normal gait and station of head and neck.     Complexity of Data:  Source Of History:  Patient  Records Review:   Previous Doctor Records, Previous Patient Records  Urine Test Review:   Urinalysis   03/26/24 12/25/23 09/19/23 06/21/23 12/21/22 06/22/22 12/13/21 10/05/21  PSA  Total PSA 0.033 ng/mL 0.12 ng/mL 0.28 ng/mL 0.22 ng/mL 0.16 ng/mL 0.156 ng/mL 0.10 ng/mL 0.085 ng/mL    07/08/24  Urinalysis  Urine Appearance Slightly Cloudy   Urine Color Yellow   Urine Glucose Neg mg/dL  Urine Bilirubin Neg mg/dL  Urine Ketones Neg mg/dL  Urine Specific Gravity 1.010   Urine Blood 2+ ery/uL  Urine pH 6.0   Urine Protein Neg mg/dL  Urine Urobilinogen 0.2 mg/dL  Urine Nitrites Neg   Urine Leukocyte Esterase 3+ leu/uL  Urine WBC/hpf 10 - 20/hpf   Urine RBC/hpf 3 - 10/hpf   Urine Epithelial Cells NS (Not Seen)   Urine Bacteria Few (10-25/hpf)   Urine Mucous Not Present   Urine Yeast NS (Not Seen)   Urine Trichomonas Not Present   Urine Cystals NS (Not Seen)   Urine Casts NS (Not Seen)   Urine Sperm Not Present    PROCEDURES:          Visit Complexity - G2211          Urinalysis w/Scope Dipstick Dipstick Cont'd Micro  Color: Yellow Bilirubin: Neg mg/dL WBC/hpf: 10 - 79/yeq  Appearance: Slightly Cloudy Ketones: Neg mg/dL RBC/hpf: 3 - 89/yeq  Specific Gravity: 1.010 Blood: 2+ ery/uL Bacteria: Few (10-25/hpf)  pH: 6.0 Protein: Neg mg/dL Cystals: NS (Not Seen)  Glucose: Neg mg/dL Urobilinogen: 0.2 mg/dL Casts: NS (Not Seen)    Nitrites: Neg Trichomonas: Not Present    Leukocyte Esterase: 3+ leu/uL Mucous: Not Present       Epithelial Cells: NS (Not Seen)      Yeast: NS (Not Seen)      Sperm: Not Present    ASSESSMENT:      ICD-10 Details  1 GU:   Microscopic hematuria - R31.21 Chronic, Stable  2   Renal calculus - N20.0 Chronic, Stable   PLAN:           Orders Labs CULTURE, URINE          Document Letter(s):  Created for  Patient: Clinical Summary         Notes:   All questions regarding his upcoming surgery were answered to the best of my ability. Urine will be sent for precautionary culture.         Next Appointment:      Next Appointment: 07/16/2024 11:00 AM    Appointment Type: Surgery     Location: [facility] [phone]    Provider: Norleen Seltzer, M.D.    Reason for Visit: OP-OBS--[facility]---RIGHT PCNL

## 2024-07-16 ENCOUNTER — Ambulatory Visit (HOSPITAL_COMMUNITY)

## 2024-07-16 ENCOUNTER — Ambulatory Visit (HOSPITAL_COMMUNITY)
Admission: RE | Admit: 2024-07-16 | Discharge: 2024-07-16 | Disposition: A | Discharge status: Home / Self Care | Source: Ambulatory Visit | Attending: Urology | Admitting: Urology

## 2024-07-16 ENCOUNTER — Inpatient Hospital Stay (HOSPITAL_COMMUNITY)
Admission: AD | Admit: 2024-07-16 | Discharge: 2024-07-18 | DRG: 660 | Disposition: A | Discharge status: Home / Self Care | Attending: Urology | Admitting: Urology

## 2024-07-16 ENCOUNTER — Ambulatory Visit (HOSPITAL_COMMUNITY)
Admission: RE | Admit: 2024-07-16 | Discharge: 2024-07-16 | Disposition: A | Source: Ambulatory Visit | Attending: Urology | Admitting: Urology

## 2024-07-16 ENCOUNTER — Encounter (HOSPITAL_COMMUNITY): Admission: RE | Disposition: A | Payer: Self-pay | Source: Home / Self Care | Attending: Urology

## 2024-07-16 ENCOUNTER — Other Ambulatory Visit: Payer: Self-pay

## 2024-07-16 ENCOUNTER — Ambulatory Visit (HOSPITAL_COMMUNITY): Payer: Self-pay | Admitting: Medical

## 2024-07-16 ENCOUNTER — Encounter (HOSPITAL_COMMUNITY): Payer: Self-pay | Admitting: Urology

## 2024-07-16 ENCOUNTER — Ambulatory Visit (HOSPITAL_COMMUNITY): Admitting: Anesthesiology

## 2024-07-16 DIAGNOSIS — Z860100 Personal history of colon polyps, unspecified: Secondary | ICD-10-CM | POA: Diagnosis not present

## 2024-07-16 DIAGNOSIS — N390 Urinary tract infection, site not specified: Secondary | ICD-10-CM | POA: Diagnosis present

## 2024-07-16 DIAGNOSIS — B964 Proteus (mirabilis) (morganii) as the cause of diseases classified elsewhere: Secondary | ICD-10-CM | POA: Diagnosis present

## 2024-07-16 DIAGNOSIS — I10 Essential (primary) hypertension: Secondary | ICD-10-CM | POA: Diagnosis present

## 2024-07-16 DIAGNOSIS — Z923 Personal history of irradiation: Secondary | ICD-10-CM

## 2024-07-16 DIAGNOSIS — Z8546 Personal history of malignant neoplasm of prostate: Secondary | ICD-10-CM | POA: Diagnosis not present

## 2024-07-16 DIAGNOSIS — Z9079 Acquired absence of other genital organ(s): Secondary | ICD-10-CM

## 2024-07-16 DIAGNOSIS — F17210 Nicotine dependence, cigarettes, uncomplicated: Secondary | ICD-10-CM | POA: Diagnosis present

## 2024-07-16 DIAGNOSIS — Z888 Allergy status to other drugs, medicaments and biological substances status: Secondary | ICD-10-CM

## 2024-07-16 DIAGNOSIS — Z8249 Family history of ischemic heart disease and other diseases of the circulatory system: Secondary | ICD-10-CM | POA: Diagnosis not present

## 2024-07-16 DIAGNOSIS — N200 Calculus of kidney: Secondary | ICD-10-CM

## 2024-07-16 DIAGNOSIS — Z79899 Other long term (current) drug therapy: Secondary | ICD-10-CM

## 2024-07-16 DIAGNOSIS — B949 Sequelae of unspecified infectious and parasitic disease: Secondary | ICD-10-CM | POA: Diagnosis not present

## 2024-07-16 HISTORY — PX: NEPHROLITHOTOMY: SHX5134

## 2024-07-16 HISTORY — PX: IR URETERAL STENT RIGHT NEW ACCESS W/O SEP NEPHROSTOMY CATH: IMG6076

## 2024-07-16 LAB — CBC WITH DIFFERENTIAL/PLATELET
Abs Immature Granulocytes: 0.04 K/uL (ref 0.00–0.07)
Basophils Absolute: 0 K/uL (ref 0.0–0.1)
Basophils Relative: 1 %
Eosinophils Absolute: 0.3 K/uL (ref 0.0–0.5)
Eosinophils Relative: 4 %
HCT: 42.9 % (ref 39.0–52.0)
Hemoglobin: 13.9 g/dL (ref 13.0–17.0)
Immature Granulocytes: 1 %
Lymphocytes Relative: 17 %
Lymphs Abs: 1.1 K/uL (ref 0.7–4.0)
MCH: 28.7 pg (ref 26.0–34.0)
MCHC: 32.4 g/dL (ref 30.0–36.0)
MCV: 88.5 fL (ref 80.0–100.0)
Monocytes Absolute: 0.7 K/uL (ref 0.1–1.0)
Monocytes Relative: 10 %
Neutro Abs: 4.4 K/uL (ref 1.7–7.7)
Neutrophils Relative %: 67 %
Platelets: 258 K/uL (ref 150–400)
RBC: 4.85 MIL/uL (ref 4.22–5.81)
RDW: 12.7 % (ref 11.5–15.5)
WBC: 6.6 K/uL (ref 4.0–10.5)
nRBC: 0 % (ref 0.0–0.2)

## 2024-07-16 LAB — TYPE AND SCREEN
ABO/RH(D): B POS
Antibody Screen: NEGATIVE

## 2024-07-16 LAB — BASIC METABOLIC PANEL WITH GFR
Anion gap: 13 (ref 5–15)
BUN: 13 mg/dL (ref 8–23)
CO2: 24 mmol/L (ref 22–32)
Calcium: 9.3 mg/dL (ref 8.9–10.3)
Chloride: 105 mmol/L (ref 98–111)
Creatinine, Ser: 0.86 mg/dL (ref 0.61–1.24)
GFR, Estimated: >60 mL/min (ref >60)
Glucose, Bld: 106 mg/dL — ABNORMAL HIGH (ref 70–99)
Potassium: 3.6 mmol/L (ref 3.5–5.1)
Sodium: 141 mmol/L (ref 135–145)

## 2024-07-16 LAB — HIV ANTIBODY (ROUTINE TESTING W REFLEX): HIV Screen 4th Generation wRfx: NONREACTIVE

## 2024-07-16 LAB — PROTIME-INR
INR: 1 (ref 0.8–1.2)
Prothrombin Time: 13.4 s (ref 11.4–15.2)

## 2024-07-16 LAB — HEMOGLOBIN AND HEMATOCRIT, BLOOD
HCT: 43.1 % (ref 39.0–52.0)
Hemoglobin: 13.9 g/dL (ref 13.0–17.0)

## 2024-07-16 SURGERY — NEPHROLITHOTOMY PERCUTANEOUS
Anesthesia: General | Laterality: Right

## 2024-07-16 MED ORDER — IOHEXOL 300 MG/ML  SOLN
100.0000 mL | Freq: Once | INTRAMUSCULAR | Status: AC | PRN
  Administered 2024-07-16: 80 mL via INTRA_ARTERIAL

## 2024-07-16 MED ORDER — MIDAZOLAM HCL 5 MG/5ML IJ SOLN
INTRAMUSCULAR | Status: DC | PRN
  Administered 2024-07-16: 1 mg via INTRAVENOUS

## 2024-07-16 MED ORDER — LOSARTAN POTASSIUM 50 MG PO TABS
100.0000 mg | ORAL_TABLET | Freq: Every day | ORAL | Status: DC
  Administered 2024-07-17 – 2024-07-18 (×2): 100 mg via ORAL
  Filled 2024-07-16 (×2): qty 2

## 2024-07-16 MED ORDER — ONDANSETRON HCL 4 MG/2ML IJ SOLN: 4.0000 mg | Freq: Once | INTRAMUSCULAR | Status: DC | PRN

## 2024-07-16 MED ORDER — FLEET ENEMA RE ENEM: 1.0000 | ENEMA | Freq: Once | RECTAL | Status: DC | PRN

## 2024-07-16 MED ORDER — NAPHAZOLINE-PHENIRAMINE 0.025-0.3 % OP SOLN: 1.0000 [drp] | Freq: Four times a day (QID) | OPHTHALMIC | Status: DC | PRN

## 2024-07-16 MED ORDER — FENTANYL CITRATE PF 50 MCG/ML IJ SOSY
25.0000 ug | PREFILLED_SYRINGE | INTRAMUSCULAR | Status: DC | PRN
  Administered 2024-07-16 (×2): 50 ug via INTRAVENOUS

## 2024-07-16 MED ORDER — ORAL CARE MOUTH RINSE: 15.0000 mL | OROMUCOSAL | Status: DC | PRN

## 2024-07-16 MED ORDER — HYOSCYAMINE SULFATE 0.125 MG SL SUBL: 0.1250 mg | SUBLINGUAL_TABLET | SUBLINGUAL | Status: DC | PRN

## 2024-07-16 MED ORDER — CHLORHEXIDINE GLUCONATE 0.12 % MT SOLN: 15.0000 mL | Freq: Once | OROMUCOSAL | Status: AC

## 2024-07-16 MED ORDER — ONDANSETRON HCL 4 MG/2ML IJ SOLN
INTRAMUSCULAR | Status: DC | PRN
  Administered 2024-07-16: 4 mg via INTRAVENOUS

## 2024-07-16 MED ORDER — LIDOCAINE HCL (CARDIAC) PF 100 MG/5ML IV SOSY
PREFILLED_SYRINGE | INTRAVENOUS | Status: DC | PRN
  Administered 2024-07-16: 100 mg via INTRATRACHEAL

## 2024-07-16 MED ORDER — OXYCODONE-ACETAMINOPHEN 5-325 MG PO TABS: 1.0000 | ORAL_TABLET | Freq: Four times a day (QID) | ORAL | 0 refills | Status: AC | PRN

## 2024-07-16 MED ORDER — ONDANSETRON HCL 4 MG/2ML IJ SOLN: 4.0000 mg | INTRAMUSCULAR | Status: DC | PRN

## 2024-07-16 MED ORDER — OXYCODONE HCL 5 MG PO TABS
5.0000 mg | ORAL_TABLET | ORAL | Status: DC | PRN
  Administered 2024-07-16 – 2024-07-18 (×5): 5 mg via ORAL
  Filled 2024-07-16 (×5): qty 1

## 2024-07-16 MED ORDER — BISACODYL 10 MG RE SUPP: 10.0000 mg | Freq: Every day | RECTAL | Status: DC | PRN

## 2024-07-16 MED ORDER — FENTANYL CITRATE (PF) 100 MCG/2ML IJ SOLN
INTRAMUSCULAR | Status: AC | PRN
  Administered 2024-07-16 (×2): 50 ug via INTRAVENOUS

## 2024-07-16 MED ORDER — CEFAZOLIN SODIUM-DEXTROSE 2-4 GM/100ML-% IV SOLN
2.0000 g | INTRAVENOUS | Status: DC
  Filled 2024-07-16: qty 100

## 2024-07-16 MED ORDER — SODIUM CHLORIDE 0.9 % IV SOLN: INTRAVENOUS | Status: DC

## 2024-07-16 MED ORDER — HYDROCHLOROTHIAZIDE 12.5 MG PO TABS
12.5000 mg | ORAL_TABLET | Freq: Every day | ORAL | Status: DC
  Administered 2024-07-17 – 2024-07-18 (×2): 12.5 mg via ORAL
  Filled 2024-07-16 (×2): qty 1

## 2024-07-16 MED ORDER — POTASSIUM CHLORIDE IN NACL 20-0.45 MEQ/L-% IV SOLN
INTRAVENOUS | Status: AC
  Filled 2024-07-16 (×2): qty 1000

## 2024-07-16 MED ORDER — ACETAMINOPHEN 325 MG PO TABS
650.0000 mg | ORAL_TABLET | ORAL | Status: DC | PRN
  Administered 2024-07-17: 650 mg via ORAL
  Filled 2024-07-16: qty 2

## 2024-07-16 MED ORDER — OXYCODONE HCL 5 MG/5ML PO SOLN
ORAL | Status: AC
  Filled 2024-07-16: qty 5

## 2024-07-16 MED ORDER — ONDANSETRON HCL 4 MG/2ML IJ SOLN
4.0000 mg | Freq: Once | INTRAMUSCULAR | Status: DC | PRN
Start: 2024-07-16 — End: 2024-07-16

## 2024-07-16 MED ORDER — OXYCODONE HCL 5 MG/5ML PO SOLN
5.0000 mg | Freq: Once | ORAL | Status: AC | PRN
  Administered 2024-07-16: 5 mg via ORAL

## 2024-07-16 MED ORDER — ROCURONIUM BROMIDE 10 MG/ML (PF) SYRINGE
PREFILLED_SYRINGE | INTRAVENOUS | Status: DC | PRN
  Administered 2024-07-16: 70 mg via INTRAVENOUS

## 2024-07-16 MED ORDER — FENTANYL CITRATE (PF) 100 MCG/2ML IJ SOLN
INTRAMUSCULAR | Status: AC
  Filled 2024-07-16: qty 2

## 2024-07-16 MED ORDER — OXYCODONE HCL 5 MG PO TABS: 5.0000 mg | ORAL_TABLET | Freq: Once | ORAL | Status: AC | PRN

## 2024-07-16 MED ORDER — KCL IN DEXTROSE-NACL 20-5-0.45 MEQ/L-%-% IV SOLN
INTRAVENOUS | Status: AC
  Filled 2024-07-16: qty 1000

## 2024-07-16 MED ORDER — SENNOSIDES-DOCUSATE SODIUM 8.6-50 MG PO TABS: 1.0000 | ORAL_TABLET | Freq: Every evening | ORAL | Status: DC | PRN

## 2024-07-16 MED ORDER — FENTANYL CITRATE PF 50 MCG/ML IJ SOSY
PREFILLED_SYRINGE | INTRAMUSCULAR | Status: AC
  Filled 2024-07-16: qty 2

## 2024-07-16 MED ORDER — ACETAMINOPHEN 10 MG/ML IV SOLN
INTRAVENOUS | Status: AC
  Filled 2024-07-16: qty 100

## 2024-07-16 MED ORDER — HYDROMORPHONE HCL 1 MG/ML IJ SOLN
INTRAMUSCULAR | Status: AC
  Filled 2024-07-16: qty 1

## 2024-07-16 MED ORDER — PROPOFOL 10 MG/ML IV BOLUS
INTRAVENOUS | Status: AC
Start: 2024-07-16 — End: 2024-07-16
  Filled 2024-07-16: qty 20

## 2024-07-16 MED ORDER — FENTANYL CITRATE (PF) 100 MCG/2ML IJ SOLN
INTRAMUSCULAR | Status: DC | PRN
  Administered 2024-07-16: 100 ug via INTRAVENOUS

## 2024-07-16 MED ORDER — SUGAMMADEX SODIUM 200 MG/2ML IV SOLN
INTRAVENOUS | Status: DC | PRN
  Administered 2024-07-16: 200 mg via INTRAVENOUS

## 2024-07-16 MED ORDER — PROPOFOL 10 MG/ML IV BOLUS
INTRAVENOUS | Status: AC
  Filled 2024-07-16: qty 20

## 2024-07-16 MED ORDER — ACETAMINOPHEN 10 MG/ML IV SOLN
1000.0000 mg | Freq: Once | INTRAVENOUS | Status: DC | PRN
  Administered 2024-07-16: 1000 mg via INTRAVENOUS

## 2024-07-16 MED ORDER — LIDOCAINE-EPINEPHRINE 1 %-1:100000 IJ SOLN
20.0000 mL | Freq: Once | INTRAMUSCULAR | Status: AC
  Administered 2024-07-16: 20 mL via INTRADERMAL

## 2024-07-16 MED ORDER — PROPOFOL 10 MG/ML IV BOLUS
INTRAVENOUS | Status: DC | PRN
Start: 2024-07-16 — End: 2024-07-16
  Administered 2024-07-16: 130 mg via INTRAVENOUS

## 2024-07-16 MED ORDER — IOHEXOL 300 MG/ML  SOLN
INTRAMUSCULAR | Status: DC | PRN
  Administered 2024-07-16: 10 mL

## 2024-07-16 MED ORDER — ORAL CARE MOUTH RINSE
15.0000 mL | Freq: Once | OROMUCOSAL | Status: AC
  Administered 2024-07-16: 15 mL via OROMUCOSAL

## 2024-07-16 MED ORDER — MIDAZOLAM HCL 2 MG/2ML IJ SOLN
INTRAMUSCULAR | Status: AC
  Filled 2024-07-16: qty 2

## 2024-07-16 MED ORDER — SODIUM CHLORIDE 0.9 % IV SOLN
2.0000 g | INTRAVENOUS | Status: DC
  Administered 2024-07-17 – 2024-07-18 (×2): 2 g via INTRAVENOUS
  Filled 2024-07-16 (×2): qty 20

## 2024-07-16 MED ORDER — SODIUM CHLORIDE 0.9 % IR SOLN
Status: DC | PRN
  Administered 2024-07-16 (×2): 6000 mL

## 2024-07-16 MED ORDER — ZOLPIDEM TARTRATE 5 MG PO TABS
5.0000 mg | ORAL_TABLET | Freq: Every evening | ORAL | Status: DC | PRN
  Administered 2024-07-16 – 2024-07-17 (×2): 5 mg via ORAL
  Filled 2024-07-16 (×2): qty 1

## 2024-07-16 MED ORDER — CEPHALEXIN 500 MG PO CAPS: 500.0000 mg | ORAL_CAPSULE | Freq: Every day | ORAL | 0 refills | Status: AC

## 2024-07-16 MED ORDER — MIDAZOLAM HCL 2 MG/2ML IJ SOLN
INTRAMUSCULAR | Status: AC
  Filled 2024-07-16: qty 4

## 2024-07-16 MED ORDER — DEXAMETHASONE SODIUM PHOSPHATE 10 MG/ML IJ SOLN
INTRAMUSCULAR | Status: DC | PRN
  Administered 2024-07-16: 8 mg via INTRAVENOUS

## 2024-07-16 MED ORDER — SODIUM CHLORIDE 0.9 % IV SOLN
2.0000 g | Freq: Once | INTRAVENOUS | Status: AC
  Administered 2024-07-16: 2 g via INTRAVENOUS
  Filled 2024-07-16: qty 20

## 2024-07-16 MED ORDER — HYDROMORPHONE HCL 1 MG/ML IJ SOLN: 0.5000 mg | INTRAMUSCULAR | Status: DC | PRN

## 2024-07-16 MED ORDER — MIDAZOLAM HCL 2 MG/2ML IJ SOLN
INTRAMUSCULAR | Status: AC | PRN
  Administered 2024-07-16: 1 mg via INTRAVENOUS

## 2024-07-16 MED ORDER — LACTATED RINGERS IV SOLN: INTRAVENOUS | Status: DC

## 2024-07-16 MED ORDER — LIDOCAINE-EPINEPHRINE 1 %-1:100000 IJ SOLN
INTRAMUSCULAR | Status: AC
  Filled 2024-07-16: qty 1

## 2024-07-16 MED ORDER — CHLORHEXIDINE GLUCONATE CLOTH 2 % EX PADS
6.0000 | MEDICATED_PAD | Freq: Every day | CUTANEOUS | Status: DC
  Administered 2024-07-17: 6 via TOPICAL

## 2024-07-16 SURGICAL SUPPLY — 45 items
BAG URINE DRAIN 2000ML AR STRL (UROLOGICAL SUPPLIES) ×1 IMPLANT
BASKET STONE NITINOL 3FRX115MB (UROLOGICAL SUPPLIES) IMPLANT
BASKET ZERO TIP NITINOL 2.4FR (BASKET) IMPLANT
BENZOIN TINCTURE PRP APPL 2/3 (GAUZE/BANDAGES/DRESSINGS) ×1 IMPLANT
BLADE SURG 15 STRL LF DISP TIS (BLADE) ×1 IMPLANT
CATH 2WAY 30CC 24FR (CATHETERS) IMPLANT
CATH COUNCIL 22FR (CATHETERS) IMPLANT
CATH ROBINSON RED A/P 20FR (CATHETERS) IMPLANT
CATH URETERAL DUAL LUMEN 10F (MISCELLANEOUS) ×1 IMPLANT
CATH URETL OPEN 5X70 (CATHETERS) IMPLANT
CATH UROLOGY TORQUE 40 (MISCELLANEOUS) ×1 IMPLANT
CATH X-FORCE N30 NEPHROSTOMY (TUBING) ×1 IMPLANT
CHLORAPREP W/TINT 26 (MISCELLANEOUS) ×1 IMPLANT
COVER BACK TABLE 60X90IN (DRAPES) ×1 IMPLANT
DRAPE C-ARM 42X120 X-RAY (DRAPES) ×1 IMPLANT
DRAPE LINGEMAN PERC (DRAPES) ×1 IMPLANT
DRAPE SURG IRRIG POUCH 19X23 (DRAPES) ×1 IMPLANT
DRSG TEGADERM 8X12 (GAUZE/BANDAGES/DRESSINGS) ×2 IMPLANT
EXTRACTOR STONE NITINOL NGAGE (UROLOGICAL SUPPLIES) IMPLANT
GAUZE PAD ABD 8X10 STRL (GAUZE/BANDAGES/DRESSINGS) ×2 IMPLANT
GAUZE SPONGE 4X4 12PLY STRL (GAUZE/BANDAGES/DRESSINGS) IMPLANT
GLOVE SURG SS PI 8.0 STRL IVOR (GLOVE) ×1 IMPLANT
GOWN STRL SURGICAL XL XLNG (GOWN DISPOSABLE) ×1 IMPLANT
GUIDEWIRE AMPLAZ .035X145 (WIRE) ×2 IMPLANT
GUIDEWIRE STR DUAL SENSOR (WIRE) IMPLANT
KIT BASIN OR (CUSTOM PROCEDURE TRAY) ×1 IMPLANT
KIT PROBE 340X3.4XDISP GRN (MISCELLANEOUS) IMPLANT
KIT PROBE TRILOGY 3.9X350 (MISCELLANEOUS) IMPLANT
KIT TURNOVER KIT A (KITS) ×1 IMPLANT
LUBRICANT JELLY K Y 4OZ (MISCELLANEOUS) ×1 IMPLANT
MANIFOLD NEPTUNE II (INSTRUMENTS) ×1 IMPLANT
NS IRRIG 1000ML POUR BTL (IV SOLUTION) ×1 IMPLANT
PACK CYSTO (CUSTOM PROCEDURE TRAY) IMPLANT
SHEATH PEELAWAY SET 9 (SHEATH) IMPLANT
SPONGE T-LAP 4X18 ~~LOC~~+RFID (SPONGE) ×1 IMPLANT
STENT URET 6FRX24 CONTOUR (STENTS) IMPLANT
SUT SILK 2 0 30 PSL (SUTURE) ×1 IMPLANT
SYR 10ML LL (SYRINGE) ×1 IMPLANT
SYR 20ML LL LF (SYRINGE) ×2 IMPLANT
TOWEL OR 17X26 10 PK STRL BLUE (TOWEL DISPOSABLE) ×1 IMPLANT
TRACTIP FLEXIVA PULS ID 200XHI (Laser) IMPLANT
TRAY FOLEY MTR SLVR 16FR STAT (SET/KITS/TRAYS/PACK) ×1 IMPLANT
TUBING CONNECTING 10 (TUBING) ×1 IMPLANT
TUBING STONE CATCHER TRILOGY (MISCELLANEOUS) IMPLANT
TUBING UROLOGY SET (TUBING) ×1 IMPLANT

## 2024-07-16 NOTE — Transfer of Care (Signed)
 Immediate Anesthesia Transfer of Care Note  Patient: Lynwood DELENA Gaskins  Procedure(s) Performed: NEPHROLITHOTOMY PERCUTANEOUS (Right)  Patient Location: PACU  Anesthesia Type:General  Level of Consciousness: awake and alert   Airway & Oxygen Therapy: Patient Spontanous Breathing  Post-op Assessment: Report given to RN  Post vital signs: Reviewed and stable  Last Vitals:  Vitals Value Taken Time  BP 140/83 07/16/24 12:47  Temp    Pulse 77 07/16/24 12:54  Resp 18 07/16/24 12:54  SpO2 90 % 07/16/24 12:54  Vitals shown include unfiled device data.  Last Pain:  Vitals:   07/16/24 0801  TempSrc:   PainSc: 0-No pain         Complications: No notable events documented.

## 2024-07-16 NOTE — Procedures (Signed)
 Vascular and Interventional Radiology Procedure Note  Patient: Dustin [name] DOB: 01/03/1951 Medical Record Number: [id number] Note Date/Time: 07/16/24 9:33 AM   Performing Physician: Thom Hall, MD Assistant(s): None  Diagnosis: R Renal calculus. Planned OR for PCNL  Procedure:  RIGHT NEPHROURETERAL ACCESS RIGHT ANTEROGRADE NEPHROSTOGRAM  Anesthesia: Conscious Sedation Complications: None Estimated Blood Loss: Minimal Specimens:  None  Findings:  Successful placement of a 5 F nephroureteral tube into the right kidney(s), with tip within the urinary bladder.  Plan: to OR for PCNL.  See detailed procedure note with images in PACS. The patient tolerated the procedure well without incident or complication and was returned to Recovery in stable condition.    Thom Hall, MD [facility]   Pager. [phone] Clinic. [phone]

## 2024-07-16 NOTE — Plan of Care (Signed)

## 2024-07-16 NOTE — Anesthesia Procedure Notes (Addendum)
 Procedure Name: Intubation Date/Time: 07/16/2024 10:53 AM  Performed by: Delores Duwaine SAUNDERS, CRNAPre-anesthesia Checklist: Patient identified, Emergency Drugs available, Suction available and Patient being monitored Patient Re-evaluated:Patient Re-evaluated prior to induction Oxygen Delivery Method: Circle System Utilized Preoxygenation: Pre-oxygenation with 100% oxygen Induction Type: IV induction Ventilation: Mask ventilation without difficulty Laryngoscope Size: Glidescope and 4 Grade View: Grade I Tube type: Oral Tube size: 7.5 mm Number of attempts: 1 Airway Equipment and Method: Stylet and Oral airway Placement Confirmation: ETT inserted through vocal cords under direct vision, positive ETCO2 and breath sounds checked- equal and bilateral Secured at: 22 cm Tube secured with: Tape Dental Injury: Teeth and Oropharynx as per pre-operative assessment  Comments: Elective video laryngoscopy due to previous airway note, grade 1 view and easy intubation with glidescope

## 2024-07-16 NOTE — Discharge Instructions (Signed)
 I left an internal tube called a stent and we will need to look in your bladder at your follow up with a small scope and remove the stent.

## 2024-07-16 NOTE — Anesthesia Preprocedure Evaluation (Signed)
 Anesthesia Evaluation  Patient identified by MRN, date of birth, ID band Patient awake    Reviewed: Allergy & Precautions, NPO status , Patient's Chart, lab work & pertinent test results, reviewed documented beta blocker date and time   Airway Mallampati: II  TM Distance: >3 FB     Dental  (+) Edentulous Upper, Edentulous Lower   Pulmonary neg COPD, Current Smoker and Patient abstained from smoking.   breath sounds clear to auscultation       Cardiovascular hypertension, (-) CAD, (-) Past MI and (-) Cardiac Stents  Rhythm:Regular Rate:Normal     Neuro/Psych neg Seizures    GI/Hepatic ,neg GERD  ,,(+) neg Cirrhosis        Endo/Other    Renal/GU Renal disease     Musculoskeletal  (+) Arthritis , Osteoarthritis,    Abdominal   Peds  Hematology   Anesthesia Other Findings   Reproductive/Obstetrics                              Anesthesia Physical Anesthesia Plan  ASA: 2  Anesthesia Plan: General   Post-op Pain Management:    Induction: Intravenous  PONV Risk Score and Plan: 2  Airway Management Planned: LMA  Additional Equipment:   Intra-op Plan:   Post-operative Plan: Extubation in OR  Informed Consent: I have reviewed the patients History and Physical, chart, labs and discussed the procedure including the risks, benefits and alternatives for the proposed anesthesia with the patient or authorized representative who has indicated his/her understanding and acceptance.     Dental advisory given  Plan Discussed with: CRNA  Anesthesia Plan Comments:          Anesthesia Quick Evaluation

## 2024-07-16 NOTE — Op Note (Signed)
 Procedure: 1.  Right percutaneous nephrolithotomy for greater than 2 cm right lower pole and renal pelvic stone. 2.  Antegrade placement of right double-J stent. 3.  Antegrade nephrostogram through existing tract and interpretation. 4.  Application of fluoroscopy.  Pre-Op diagnosis: Greater than 2 cm right lower pole and renal pelvic stone.  Postop diagnosis: Same.  Surgeon: Dr. Norleen Seltzer.  Anesthesia: General.  Specimen: Stone fragments.  Drains: 1.  Foley catheter. 2.  6 French by 24 cm right double-J stent. 3.  22 French nephrostomy catheter.  EBL: 100 mL.  Complications: None.  Indications: The patient is a 73 year old male who was found to have an enlarging left lower pole stone that a was associated with a Proteus UTI and had grown to approximately 3-1/2 cm in greatest dimension.  It was felt that percutaneous nephrolithotomy was most appropriate approach for management.  He was growing Proteus on his preop urine culture and was placed on Keflex on 9/23.  He was initially taken to IR and received Rocephin.  A lower pole access was obtained.  He was then taken to the operating room where general anesthetic was induced on the holding room stretcher.  A Foley catheter was inserted using sterile technique.  He was then rolled prone on chest rolls with all pressure points padded on the operative table and was fitted with PAS hose.  His nephrostomy access site was then prepped with ChloraPrep and after appropriate drying time was draped in the usual sterile fashion.  The access site was then uncapped and a Super Stiff Amplatz guidewire was passed through the catheter into the bladder under fluoroscopic guidance and the access catheter was removed.  An 8 French dual-lumen catheter was then advanced over the wire to the bladder and a second Super Stiff Amplatz guidewire was passed.  The dual-lumen catheter was then removed and the incision was lengthened with a scalpel to approximately 2  cm.  The NephroMax nephrostomy balloon catheter was then passed under fluoroscopic guidance into the renal pelvis and the balloon was inflated to 20 atm.  The access sheath was advanced over the balloon under fluoroscopic guidance and the balloon was deflated and removed.  The rigid nephroscope was then advanced and a few clots were removed with the grasping forceps.  The stone was then identified and the trilogy lithotrite was then prepared and set on the medium setting.  The stone fragmented readily.  Once the bulk of the stone had been removed down into the renal pelvis and the UPJ was visualized.  The rigid scope was removed and the flexible scope was placed.  Using the flexible nephroscope I inspected the upper mid and lower calyces and additional stone material was identified in an anterior calyx in the lower pole.  The upper and mid calyces were stone free.  The rigid nephroscope was then replaced and the trilogy was used to evacuate the remaining stone material.  Final inspection with the rigid and flexible nephroscope demonstrated no residual stone material and fluoroscopy demonstrated no obvious residual stones; however his stones were relatively radiolucent on imaging.  A 5 French open-ended catheter was then advanced over the safety wire and it was removed atraumatically.  The rigid nephroscope was then advanced over the working wire and a 6 [place] by 24 cm contour double-J stent was then advanced under fluoroscopic and visual guidance until the distal loop was in the bladder and the proximal loop was in the renal pelvis.  The wire was removed and  grasping forceps were then used to position the proximal loop in the renal pelvis.  A 22 [place] council nephrostomy catheter was then advanced through the sheath into the renal pelvis under fluoroscopic guidance and the sheath was backed out partially.  The balloon was filled with 2 mL of sterile water .  An antegrade nephrostogram was performed with  a 50-50 mixture of Omnipaque and sterile water  with approximately 10 mL of the solution being instilled.  Antegrade nephrostogram demonstrated good position of the nephrostomy catheter without extravasation and with good antegrade flow.  No filling defects were identified.   The sheath was then backed out to the skin level and the incision was closed around the catheter with a 2-0 silk suture and then the catheter was secured to the skin with the same suture.  The sheath was cut away from the catheter.  There was some moderate venous bleeding around the catheter so pressure was held with a blue towel for approximately 8 minutes at which time there was only minimal oozing around the catheter.  The drapes were removed and the catheter was placed to straight drainage.  A dressing of split 4 x 4's and ABDs were secured in place with Tegaderm.  He was then rolled supine on the recovery room stretcher, his anesthetic was reversed and he was removed to the PACU in stable condition.  There were no complications.

## 2024-07-16 NOTE — Anesthesia Postprocedure Evaluation (Signed)
 Anesthesia Post Note  Patient: Dustin [name]  Procedure(s) Performed: NEPHROLITHOTOMY PERCUTANEOUS (Right)     Patient location during evaluation: PACU Anesthesia Type: General Level of consciousness: awake and alert Pain management: pain level controlled Vital Signs Assessment: post-procedure vital signs reviewed and stable Respiratory status: spontaneous breathing, nonlabored ventilation, respiratory function stable and patient connected to nasal cannula oxygen Cardiovascular status: blood pressure returned to baseline and stable Postop Assessment: no apparent nausea or vomiting Anesthetic complications: no   No notable events documented.  Last Vitals:  Vitals:   07/16/24 1345 07/16/24 1400  BP: 125/70 135/79  Pulse: 64 (!) 55  Resp: 16 18  Temp:    SpO2: 95% 96%    Last Pain:  Vitals:   07/16/24 1335  TempSrc:   PainSc: 3                  Dustin [name]

## 2024-07-16 NOTE — Interval H&P Note (Signed)
 History and Physical Interval Note:  His culture grew proteus and he is on keflex for that.  He has no new complaints.   07/16/2024 8:34 AM  Dustin [name]  has presented today for surgery, with the diagnosis of RIGHT RENAL STONE.  The various methods of treatment have been discussed with the patient and family. After consideration of risks, benefits and other options for treatment, the patient has consented to  Procedure(s) with comments: NEPHROLITHOTOMY PERCUTANEOUS (Right) - RIGHT PERCUTANEOUS NEPHROLITHOTOMY FIRST STAGE as a surgical intervention.  The patient's history has been reviewed, patient examined, no change in status, stable for surgery.  I have reviewed the patient's chart and labs.  Questions were answered to the patient's satisfaction.     Stanisha Lorenz

## 2024-07-17 ENCOUNTER — Encounter (HOSPITAL_COMMUNITY): Payer: Self-pay | Admitting: Urology

## 2024-07-17 ENCOUNTER — Observation Stay (HOSPITAL_COMMUNITY)

## 2024-07-17 DIAGNOSIS — N200 Calculus of kidney: Secondary | ICD-10-CM | POA: Diagnosis not present

## 2024-07-17 LAB — CBC
HCT: 41.3 % (ref 39.0–52.0)
Hemoglobin: 13.5 g/dL (ref 13.0–17.0)
MCH: 28.9 pg (ref 26.0–34.0)
MCHC: 32.7 g/dL (ref 30.0–36.0)
MCV: 88.4 fL (ref 80.0–100.0)
Platelets: 245 K/uL (ref 150–400)
RBC: 4.67 MIL/uL (ref 4.22–5.81)
RDW: 12.7 % (ref 11.5–15.5)
WBC: 9.8 K/uL (ref 4.0–10.5)
nRBC: 0 % (ref 0.0–0.2)

## 2024-07-17 LAB — BASIC METABOLIC PANEL WITH GFR
Anion gap: 11 (ref 5–15)
BUN: 13 mg/dL (ref 8–23)
CO2: 23 mmol/L (ref 22–32)
Calcium: 8.9 mg/dL (ref 8.9–10.3)
Chloride: 103 mmol/L (ref 98–111)
Creatinine, Ser: 0.94 mg/dL (ref 0.61–1.24)
GFR, Estimated: >60 mL/min (ref >60)
Glucose, Bld: 105 mg/dL — ABNORMAL HIGH (ref 70–99)
Potassium: 3.4 mmol/L — ABNORMAL LOW (ref 3.5–5.1)
Sodium: 138 mmol/L (ref 135–145)

## 2024-07-17 NOTE — Progress Notes (Signed)
 Transported patient to and from CT in a Wheelchair. Patient is able to walk to and from the bed and wheelchair.

## 2024-07-17 NOTE — Care Management Obs Status (Signed)
 MEDICARE OBSERVATION STATUS NOTIFICATION   Patient Details  Name: Dustin [name] MRN: [id number] Date of Birth: Apr 14, 1951   Medicare Observation Status Notification Given:  Yes    Duwaine GORMAN Aran, LCSW 07/17/2024, 1:34 PM

## 2024-07-17 NOTE — Progress Notes (Signed)
   07/17/24 0932  TOC Brief Assessment  Insurance and Status Reviewed  Patient has primary care physician Yes  Home environment has been reviewed Resides alone in single family home  Prior level of function: Independent with ADLs at baseline  Prior/Current Home Services No current home services  Social Drivers of Health Review SDOH reviewed no interventions necessary  Readmission risk has been reviewed Yes  Transition of care needs no transition of care needs at this time

## 2024-07-17 NOTE — Progress Notes (Signed)
 1 Day Post-Op  Subjective: Dustin [name] is doing well without significant pain.  The urine is clear in the foley and NT tubing.  He has a low grade fever of 100.5.  His WBC is normal and his Hgb is 13.5 which is minimally decreased.  Chemistries are ok except for a borderline K.  CT shows tubes in good position and no residual stones.    ROS:  Review of Systems  All other systems reviewed and are negative.   Anti-infectives: Anti-infectives (From admission, onward)    Start     Dose/Rate Route Frequency Ordered Stop   07/17/24 1000  cefTRIAXone (ROCEPHIN) 2 g in sodium chloride  0.9 % 100 mL IVPB        2 g 200 mL/hr over 30 Minutes Intravenous Every 24 hours 07/16/24 1949 07/24/24 0959   07/16/24 0900  ceFAZolin  (ANCEF ) IVPB 2g/100 mL premix  Status:  Discontinued        2 g 200 mL/hr over 30 Minutes Intravenous 30 min pre-op 07/16/24 0749 07/16/24 0840   07/16/24 0830  cefTRIAXone (ROCEPHIN) 2 g in sodium chloride  0.9 % 100 mL IVPB        2 g 200 mL/hr over 30 Minutes Intravenous  Once 07/16/24 0735 07/16/24 1004   07/16/24 0000  cephALEXin (KEFLEX) 500 MG capsule        500 mg Oral Daily at bedtime 07/16/24 1258 08/15/24 2359       Current Facility-Administered Medications  Medication Dose Route Frequency Provider Last Rate Last Admin   acetaminophen  (TYLENOL ) tablet 650 mg  650 mg Oral Q4H PRN Calene Paradiso, MD       bisacodyl  (DULCOLAX) suppository 10 mg  10 mg Rectal Daily PRN Stacye Noori, MD       cefTRIAXone (ROCEPHIN) 2 g in sodium chloride  0.9 % 100 mL IVPB  2 g Intravenous Q24H Eufelia Veno, MD       Chlorhexidine  Gluconate Cloth 2 % PADS 6 each  6 each Topical Daily Jazmene Racz, MD       hydrochlorothiazide  (HYDRODIURIL ) tablet 12.5 mg  12.5 mg Oral Daily Khyler Urda, MD       HYDROmorphone  (DILAUDID ) injection 0.5-1 mg  0.5-1 mg Intravenous Q2H PRN Watt Rush, MD       hyoscyamine (LEVSIN SL) SL tablet 0.125 mg  0.125 mg Sublingual Q4H PRN Watt Rush, MD       losartan   (COZAAR ) tablet 100 mg  100 mg Oral Daily Tytan Sandate, MD       naphazoline-pheniramine (NAPHCON-A) 0.025-0.3 % ophthalmic solution 1 drop  1 drop Both Eyes QID PRN Karalyn Kadel, MD       ondansetron  (ZOFRAN ) injection 4 mg  4 mg Intravenous Q4H PRN Watt Rush, MD       Oral care mouth rinse  15 mL Mouth Rinse PRN Anaclara Acklin, MD       oxyCODONE  (Oxy IR/ROXICODONE ) immediate release tablet 5 mg  5 mg Oral Q4H PRN Altagracia Rone, MD   5 mg at 07/16/24 2329   senna-docusate (Senokot-S) tablet 1 tablet  1 tablet Oral QHS PRN Annaliese Saez, MD       sodium phosphate  (FLEET) enema 1 enema  1 enema Rectal Once PRN Ryan Palermo, MD       zolpidem KAYLA) tablet 5 mg  5 mg Oral QHS PRN Watt Rush, MD   5 mg at 07/16/24 2329     Objective: Vital signs in last 24 hours: Temp:  [97.2 F (36.2  C)-100.5 F (38.1 C)] 100.5 F (38.1 C) (10/01 0749) Pulse Rate:  [52-84] 84 (10/01 0749) Resp:  [12-20] 18 (10/01 0749) BP: (117-160)/(62-90) 139/77 (10/01 0749) SpO2:  [90 %-100 %] 91 % (10/01 0749)  Intake/Output from previous day: 09/30 0701 - 10/01 0700 In: 2305.5 [P.O.:960; I.V.:1345.5] Out: 1500 [Urine:1400; Blood:100] Intake/Output this shift: No intake/output data recorded.   Physical Exam Vitals reviewed.  Constitutional:      Appearance: Normal appearance.  Cardiovascular:     Rate and Rhythm: Normal rate and regular rhythm.     Heart sounds: Normal heart sounds.  Pulmonary:     Effort: Pulmonary effort is normal. No respiratory distress.     Breath sounds: Normal breath sounds.  Neurological:     Mental Status: He is alert.     Lab Results:  Recent Labs    07/16/24 0800 07/16/24 1356 07/17/24 0414  WBC 6.6  --  9.8  HGB 13.9 13.9 13.5  HCT 42.9 43.1 41.3  PLT 258  --  245   BMET Recent Labs    07/16/24 0800 07/17/24 0414  NA 141 138  K 3.6 3.4*  CL 105 103  CO2 24 23  GLUCOSE 106* 105*  BUN 13 13  CREATININE 0.86 0.94  CALCIUM  9.3 8.9   PT/INR Recent Labs     07/16/24 0800  LABPROT 13.4  INR 1.0   ABG No results for input(s): PHART, HCO3 in the last 72 hours.  Invalid input(s): PCO2, PO2  Studies/Results: CT ABDOMEN PELVIS WO CONTRAST Result Date: 07/17/2024 CLINICAL DATA:  73 year old male status post renal stones, percutaneous nephrolithotomy. Query residual fragments. EXAM: CT ABDOMEN AND PELVIS WITHOUT CONTRAST TECHNIQUE: Multidetector CT imaging of the abdomen and pelvis was performed following the standard protocol without IV contrast. RADIATION DOSE REDUCTION: This exam was performed according to the departmental dose-optimization program which includes automated exposure control, adjustment of the mA and/or kV according to patient size and/or use of iterative reconstruction technique. COMPARISON:  Noncontrast CT Abdomen and Pelvis 04/02/2024. FINDINGS: Lower chest: Bilateral new lower lobe consolidation with streaky peribronchial opacity, air bronchograms. Trace superimposed pleural fluid. No pericardial effusion. Heart size remains normal. Only minor atelectasis in the middle lobes. Hepatobiliary: Noncontrast liver appears stable, negative. Subtle layering density in the gallbladder raising the possibility of sludge or small stones. Pancreas: Negative noncontrast appearance. Spleen: Negative. Adrenals/Urinary Tract: Normal adrenal glands. Stable left kidney.  No convincing left nephrolithiasis. Right renal lower pole staghorn calculus demonstrated in June. New percutaneous nephrostomy and right ureteral stent are in place. Stent terminates with distal pigtail in the urinary bladder. Superimposed Foley catheter within the bladder. Bladder is decompressed, which might account for the appearance of wall thickening there. No residual intrarenal calculus is identified. Trace gas within the right renal collecting system. No significant hydronephrosis. And no definite stones seen along the course of the ureteral stent. Right pararenal space  stranding appears to be postoperative related. No organized or drainable pararenal fluid collection. Stomach/Bowel: Large bowel intermittent retained stool and diverticulosis. Normal appendix on series 2, image 54. No large bowel inflammation. No dilated small bowel. Chronic rectus muscle diastasis, no ventral abdominal herniation of bowel. Decompressed stomach and duodenum. No pneumoperitoneum. No free fluid identified in the peritoneal cavity. Vascular/Lymphatic: Stable noncontrast appearance. Aortoiliac calcified atherosclerosis. Reproductive: Urethral catheter in place, partially visible. Other: No pelvis free fluid. Musculoskeletal: Advanced degeneration in the spine, widespread vacuum disc and endplate spurring. Mild to moderate underlying dextroconvex lumbar scoliosis. No acute osseous  abnormality identified. Small chronic dermal or subdermal cyst suspected posterior to the right lower ribs on series 2, image 7, stable since June. IMPRESSION: 1. Right percutaneous nephrostomy and ureteral stent in place plus Foley catheter. Previous right renal staghorn calculus. No residual right renal, ureteral, or bladder stone is identified. 2. Bilateral new lower lobe streaky opacity and lung base consolidation. This might be pronounced atelectasis, but aspiration or infection are not excluded. Trace superimposed pleural effusions. 3. No other acute or inflammatory process identified in the noncontrast abdomen or pelvis. 4.  Aortic Atherosclerosis (ICD10-I70.0). Electronically Signed   By: VEAR Hurst M.D.   On: 07/17/2024 06:11   DG C-Arm 1-60 Min-No Report Result Date: 07/16/2024 Fluoroscopy was utilized by the requesting physician.  No radiographic interpretation.   DG C-Arm 1-60 Min-No Report Result Date: 07/16/2024 Fluoroscopy was utilized by the requesting physician.  No radiographic interpretation.   IR URETERAL STENT RIGHT NEW ACCESS W/O SEP NEPHROSTOMY CATH Result Date: 07/16/2024 INDICATION: Renal stones,  access for RIGHT percutaneous nephrolithotomy. RT PCNL, OR TO FOLLOW EXAM: Procedures; 1. RIGHT ANTEGRADE NEPHROSTOGRAM 2. RIGHT NEPHROURETERAL CATHETER FOR NEPHROLITHOTOMY ACCESS COMPARISON:  CT urogram, 04/02/2024 and 08/16/2023. US  Renal, 01/01/2024. KUB, 09/25/2023. MEDICATIONS: Ancef  2 gm IV; The antibiotic was administered in an appropriate time frame prior to skin puncture. ANESTHESIA/SEDATION: Moderate (conscious) sedation was employed during this procedure. A total of Versed  2 mg and Fentanyl  100 mcg was administered intravenously. Moderate Sedation Time: 24 minutes. The patient's level of consciousness and vital signs were monitored continuously by radiology nursing throughout the procedure under my direct supervision. CONTRAST:  80mL OMNIPAQUE IOHEXOL 300 MG/ML SOLN - Administered into the renal collecting system. FLUOROSCOPY: Radiation Exposure Index and estimated peak skin dose (PSD); Reference air kerma (RAK), 28 mGy. COMPLICATIONS: None immediate. PROCEDURE: Informed written consent was obtained from the patient after a discussion of the risks, benefits, and alternatives to treatment. The RIGHT flank region was prepped with sterile prep in a usual fashion, and a sterile drape was applied covering the operative field. A sterile gown and sterile gloves were used for the procedure. A timeout was performed prior to the initiation of the procedure. A pre procedural spot fluoroscopic image was obtained of the upper abdomen. Ultrasound scanning performed of the kidney was negative for significant hydronephrosis. As such, the stone within inferior pole was targeted using a combination of ultrasound and fluoroscopically with a 22 gauge Chiba needle. Access to the collecting system was confirmed with advancement of a Nitrex wire into the collecting system. The needle was exchanged for the inner 3 Fr catheter from an Accustick set and contrast injection confirmed access. An Accustick set was utilized to dilate  the tract and was subsequently exchanged for a Kumpe catheter over a Bentson wire. The Kumpe catheter was advanced down the ureter and into the urinary bladder. Postprocedural spot radiographs were obtained in various obliquities and the catheter was sutured to the skin. The catheter was capped and a dressing was placed. The patient tolerated the procedure well without immediate post procedural complication. FINDINGS: *Pre procedural spot radiographic images demonstrates stone burden at the RIGHT inferior renal collecting system. *Ultrasound scanning demonstrated decompressed RIGHT renal collecting system without hydronephrosis. *Antegrade nephrostogram demonstrating a partially-obstructive RIGHT renal calculus. IMPRESSION: Successful placement of a RIGHT 5 Fr nephroureteral catheter, with tip at the level of the urinary bladder for access during nephrolithotomy. PLAN: To OR for percutaneous nephrolithotomy with urology, Dr. NORLEEN SELTZER . Thom Hall, MD Vascular [facility] Interventional Radiology  [facility] Radiology Electronically Signed   By: Thom Hall M.D.   On: 07/16/2024 10:47     Assessment and Plan:  Right renal stone.  Stone free after PCNL.   Will consider clamping trial later today and possible tube removal.  D/C foley and start IS.  May D/C later today.   2.   History of proteus UTI.  He is on culture appropriate antibiotics but has a low grade fever.  I will monitor at this time.       LOS: 0 days    Norleen Seltzer 10/1/2025Patient ID: Dustin [name], male   DOB: 19-Apr-1951, 73 y.o.   MRN: [id number]

## 2024-07-18 DIAGNOSIS — Z9079 Acquired absence of other genital organ(s): Secondary | ICD-10-CM | POA: Diagnosis not present

## 2024-07-18 DIAGNOSIS — Z79899 Other long term (current) drug therapy: Secondary | ICD-10-CM | POA: Diagnosis not present

## 2024-07-18 DIAGNOSIS — Z8546 Personal history of malignant neoplasm of prostate: Secondary | ICD-10-CM | POA: Diagnosis not present

## 2024-07-18 DIAGNOSIS — B964 Proteus (mirabilis) (morganii) as the cause of diseases classified elsewhere: Secondary | ICD-10-CM | POA: Diagnosis present

## 2024-07-18 DIAGNOSIS — N200 Calculus of kidney: Secondary | ICD-10-CM | POA: Diagnosis present

## 2024-07-18 DIAGNOSIS — Z860100 Personal history of colon polyps, unspecified: Secondary | ICD-10-CM | POA: Diagnosis not present

## 2024-07-18 DIAGNOSIS — I10 Essential (primary) hypertension: Secondary | ICD-10-CM | POA: Diagnosis present

## 2024-07-18 DIAGNOSIS — Z923 Personal history of irradiation: Secondary | ICD-10-CM | POA: Diagnosis not present

## 2024-07-18 DIAGNOSIS — F17210 Nicotine dependence, cigarettes, uncomplicated: Secondary | ICD-10-CM | POA: Diagnosis present

## 2024-07-18 DIAGNOSIS — N390 Urinary tract infection, site not specified: Secondary | ICD-10-CM | POA: Diagnosis present

## 2024-07-18 DIAGNOSIS — Z888 Allergy status to other drugs, medicaments and biological substances status: Secondary | ICD-10-CM | POA: Diagnosis not present

## 2024-07-18 DIAGNOSIS — Z8249 Family history of ischemic heart disease and other diseases of the circulatory system: Secondary | ICD-10-CM | POA: Diagnosis not present

## 2024-07-18 NOTE — Progress Notes (Signed)
 AVS reviewed with patient. Awaiting ride

## 2024-07-18 NOTE — Discharge Summary (Signed)
 Patient ID: Dustin [name] MRN: [id number] DOB/AGE: May 26, 1951 73 y.o.  Admit date: 07/16/2024 Discharge date: 07/18/2024  Primary Care Physician:  Fanta, Tesfaye Demissie, MD  Diagnoses:  Proteus UTI and right staghorn calculus  Discharge Medications: Allergies as of 07/18/2024   No Known Allergies      Medication List     TAKE these medications    acetaminophen  650 MG CR tablet Commonly known as: TYLENOL  Take 1,300 mg by mouth every 8 (eight) hours as needed for pain.   cephALEXin 500 MG capsule Commonly known as: KEFLEX Take 1 capsule (500 mg total) by mouth at bedtime.   hydrochlorothiazide  12.5 MG tablet Commonly known as: HYDRODIURIL  Take 12.5 mg by mouth daily.   losartan  100 MG tablet Commonly known as: COZAAR  Take 100 mg by mouth daily.   oxyCODONE -acetaminophen  5-325 MG tablet Commonly known as: PERCOCET/ROXICET Take 1 tablet by mouth every 6 (six) hours as needed for up to 5 days for severe pain (pain score 7-10). For acute post op pain.  ICD G89.18   Visine 0.025-0.3 % ophthalmic solution Generic drug: naphazoline-pheniramine Place 1 drop into both eyes 4 (four) times daily as needed for eye irritation.         Significant Diagnostic Studies:  CT ABDOMEN PELVIS WO CONTRAST Result Date: 07/17/2024 CLINICAL DATA:  73 year old male status post renal stones, percutaneous nephrolithotomy. Query residual fragments. EXAM: CT ABDOMEN AND PELVIS WITHOUT CONTRAST TECHNIQUE: Multidetector CT imaging of the abdomen and pelvis was performed following the standard protocol without IV contrast. RADIATION DOSE REDUCTION: This exam was performed according to the departmental dose-optimization program which includes automated exposure control, adjustment of the mA and/or kV according to patient size and/or use of iterative reconstruction technique. COMPARISON:  Noncontrast CT Abdomen and Pelvis 04/02/2024. FINDINGS: Lower chest: Bilateral new lower lobe consolidation with  streaky peribronchial opacity, air bronchograms. Trace superimposed pleural fluid. No pericardial effusion. Heart size remains normal. Only minor atelectasis in the middle lobes. Hepatobiliary: Noncontrast liver appears stable, negative. Subtle layering density in the gallbladder raising the possibility of sludge or small stones. Pancreas: Negative noncontrast appearance. Spleen: Negative. Adrenals/Urinary Tract: Normal adrenal glands. Stable left kidney.  No convincing left nephrolithiasis. Right renal lower pole staghorn calculus demonstrated in June. New percutaneous nephrostomy and right ureteral stent are in place. Stent terminates with distal pigtail in the urinary bladder. Superimposed Foley catheter within the bladder. Bladder is decompressed, which might account for the appearance of wall thickening there. No residual intrarenal calculus is identified. Trace gas within the right renal collecting system. No significant hydronephrosis. And no definite stones seen along the course of the ureteral stent. Right pararenal space stranding appears to be postoperative related. No organized or drainable pararenal fluid collection. Stomach/Bowel: Large bowel intermittent retained stool and diverticulosis. Normal appendix on series 2, image 54. No large bowel inflammation. No dilated small bowel. Chronic rectus muscle diastasis, no ventral abdominal herniation of bowel. Decompressed stomach and duodenum. No pneumoperitoneum. No free fluid identified in the peritoneal cavity. Vascular/Lymphatic: Stable noncontrast appearance. Aortoiliac calcified atherosclerosis. Reproductive: Urethral catheter in place, partially visible. Other: No pelvis free fluid. Musculoskeletal: Advanced degeneration in the spine, widespread vacuum disc and endplate spurring. Mild to moderate underlying dextroconvex lumbar scoliosis. No acute osseous abnormality identified. Small chronic dermal or subdermal cyst suspected posterior to the right  lower ribs on series 2, image 7, stable since June. IMPRESSION: 1. Right percutaneous nephrostomy and ureteral stent in place plus Foley catheter. Previous right renal staghorn calculus. No  residual right renal, ureteral, or bladder stone is identified. 2. Bilateral new lower lobe streaky opacity and lung base consolidation. This might be pronounced atelectasis, but aspiration or infection are not excluded. Trace superimposed pleural effusions. 3. No other acute or inflammatory process identified in the noncontrast abdomen or pelvis. 4.  Aortic Atherosclerosis (ICD10-I70.0). Electronically Signed   By: VEAR Hurst M.D.   On: 07/17/2024 06:11   DG C-Arm 1-60 Min-No Report Result Date: 07/16/2024 Fluoroscopy was utilized by the requesting physician.  No radiographic interpretation.   DG C-Arm 1-60 Min-No Report Result Date: 07/16/2024 Fluoroscopy was utilized by the requesting physician.  No radiographic interpretation.   IR URETERAL STENT RIGHT NEW ACCESS W/O SEP NEPHROSTOMY CATH Result Date: 07/16/2024 INDICATION: Renal stones, access for RIGHT percutaneous nephrolithotomy. RT PCNL, OR TO FOLLOW EXAM: Procedures; 1. RIGHT ANTEGRADE NEPHROSTOGRAM 2. RIGHT NEPHROURETERAL CATHETER FOR NEPHROLITHOTOMY ACCESS COMPARISON:  CT urogram, 04/02/2024 and 08/16/2023. US  Renal, 01/01/2024. KUB, 09/25/2023. MEDICATIONS: Ancef  2 gm IV; The antibiotic was administered in an appropriate time frame prior to skin puncture. ANESTHESIA/SEDATION: Moderate (conscious) sedation was employed during this procedure. A total of Versed  2 mg and Fentanyl  100 mcg was administered intravenously. Moderate Sedation Time: 24 minutes. The patient's level of consciousness and vital signs were monitored continuously by radiology nursing throughout the procedure under my direct supervision. CONTRAST:  80mL OMNIPAQUE IOHEXOL 300 MG/ML SOLN - Administered into the renal collecting system. FLUOROSCOPY: Radiation Exposure Index and estimated peak skin  dose (PSD); Reference air kerma (RAK), 28 mGy. COMPLICATIONS: None immediate. PROCEDURE: Informed written consent was obtained from the patient after a discussion of the risks, benefits, and alternatives to treatment. The RIGHT flank region was prepped with sterile prep in a usual fashion, and a sterile drape was applied covering the operative field. A sterile gown and sterile gloves were used for the procedure. A timeout was performed prior to the initiation of the procedure. A pre procedural spot fluoroscopic image was obtained of the upper abdomen. Ultrasound scanning performed of the kidney was negative for significant hydronephrosis. As such, the stone within inferior pole was targeted using a combination of ultrasound and fluoroscopically with a 22 gauge Chiba needle. Access to the collecting system was confirmed with advancement of a Nitrex wire into the collecting system. The needle was exchanged for the inner 3 Fr catheter from an Accustick set and contrast injection confirmed access. An Accustick set was utilized to dilate the tract and was subsequently exchanged for a Kumpe catheter over a Bentson wire. The Kumpe catheter was advanced down the ureter and into the urinary bladder. Postprocedural spot radiographs were obtained in various obliquities and the catheter was sutured to the skin. The catheter was capped and a dressing was placed. The patient tolerated the procedure well without immediate post procedural complication. FINDINGS: *Pre procedural spot radiographic images demonstrates stone burden at the RIGHT inferior renal collecting system. *Ultrasound scanning demonstrated decompressed RIGHT renal collecting system without hydronephrosis. *Antegrade nephrostogram demonstrating a partially-obstructive RIGHT renal calculus. IMPRESSION: Successful placement of a RIGHT 5 Fr nephroureteral catheter, with tip at the level of the urinary bladder for access during nephrolithotomy. PLAN: To OR for  percutaneous nephrolithotomy with urology, Dr. NORLEEN SELTZER . Thom Hall, MD Vascular and Interventional Radiology Specialists [facility] Radiology Electronically Signed   By: Thom Hall M.D.   On: 07/16/2024 10:47    Brief H and P: For complete details please refer to admission H&P, but in brief, patient is a 73 year old male presenting to  [facility] hospital for scheduled right percutaneous nephrolithotomy with Dr. Watt on 07/16/2024.  Placed on Keflex on 07/09/2024.  He was taken to interventional radiology for access to lower pole and provided with Rocephin.  Procedure was completed without complication and final visualization with nephroscope and fluoroscopy showed no residual stone material.  6 French double JJ stent was placed and confirmed to be in good position.  Patient was discharged to the floor for labs and monitoring.  He had a minor fever-Tmax 100.5 overnight and was held for monitoring.  On assessment this morning he is alert, oriented, and in no distress.  He has been afebrile overnight.  He reported an expected level of discomfort in his right flank.  Nephrostomy was removed at bedside and dressed with Tegaderm and Vaseline gauze.  Patient felt relief after drain removal and began ambulating.  Shared decision made to discharge home as planned this morning.  Follow-up established for 07/29/2024.  Hospital Course:  Principal Problem:   Right renal stone   Day of Discharge BP (!) 166/89 (BP Location: Right Arm)   Pulse 81   Temp 99 F (37.2 C) (Oral)   Resp 19   Ht 5' 9 (1.753 m)   Wt 93.4 kg   SpO2 94%   BMI 30.42 kg/m   No results found for this or any previous visit (from the past 24 hours).  Physical Exam: General: Alert and awake oriented x3 not in any acute distress. CVS: no cyanosis Chest: normal rate and work of breathing Abdomen: soft nontender, non-distended GU: Nephrostomy drain removed.  Vaseline gauze with Tegaderm placed.   Disposition: Home  today  Diet: Regular  Activity: As tolerated   CHARGE FOLLOW-UP   Follow-up Information     Claudene Waddell HERO, PA-C Follow up on 07/29/2024.   Why: 930a Contact information: [address] [place] [address] [phone]                 Time spent on Discharge:    Signed: OLE ORN Ceylon Arenson 07/18/2024, 10:17 AM

## 2024-07-19 ENCOUNTER — Telehealth: Payer: Self-pay

## 2024-07-19 NOTE — Transitions of Care (Post Inpatient/ED Visit) (Signed)
   07/19/2024  Name: Dustin [name] MRN: [id number] DOB: Aug 18, 1951  Today's TOC FU Call Status: Today's TOC FU Call Status:: Unsuccessful Call (1st Attempt) Unsuccessful Call (1st Attempt) Date: 07/19/24  Attempted to reach the patient regarding the most recent Inpatient/ED visit.  Follow Up Plan: Additional outreach attempts will be made to reach the patient to complete the Transitions of Care (Post Inpatient/ED visit) call.   Darlena Koval J. Jeffree Cazeau RN, MSN [facility], [facility] RN Care Manager Direct Dial: [phone]  Fax: [phone] Website: [email or web address]

## 2024-07-22 ENCOUNTER — Telehealth: Payer: Self-pay

## 2024-07-22 NOTE — Transitions of Care (Post Inpatient/ED Visit) (Signed)
   07/22/2024  Name: DQUAN CORTOPASSI MRN: [id number] DOB: 31-Oct-1950  Today's TOC FU Call Status: Today's TOC FU Call Status:: Unsuccessful Call (2nd Attempt) Unsuccessful Call (2nd Attempt) Date: 07/22/24  Attempted to reach the patient regarding the most recent Inpatient/ED visit.  Follow Up Plan: Additional outreach attempts will be made to reach the patient to complete the Transitions of Care (Post Inpatient/ED visit) call.   Arkeem Harts J. Flavius Repsher RN, MSN [facility], [facility] RN Care Manager Direct Dial: [phone]  Fax: [phone] Website: [email or web address]

## 2024-07-23 ENCOUNTER — Telehealth: Payer: Self-pay

## 2024-07-23 NOTE — Transitions of Care (Post Inpatient/ED Visit) (Signed)
   07/23/2024  Name: Dustin [name] MRN: [id number] DOB: December 31, 1950  Today's TOC FU Call Status: Today's TOC FU Call Status:: Unsuccessful Call (3rd Attempt) Unsuccessful Call (3rd Attempt) Date: 07/23/24  Attempted to reach the patient regarding the most recent Inpatient/ED visit.  Follow Up Plan: No further outreach attempts will be made at this time. We have been unable to contact the patient.  Dionel Archey J. Markee Matera RN, MSN [facility], [facility] RN Care Manager Direct Dial: [phone]  Fax: [phone] Website: [email or web address]

## 2024-07-26 LAB — STONE ANALYSIS
Calcium Phosphate (Carbonate): 10 %
Struvite (MgNH4PO4 6H2O): 90 %
Weight Calculi: 890 mg

## 2024-07-29 DIAGNOSIS — N3020 Other chronic cystitis without hematuria: Secondary | ICD-10-CM | POA: Diagnosis not present

## 2024-07-29 DIAGNOSIS — N200 Calculus of kidney: Secondary | ICD-10-CM | POA: Diagnosis not present

## 2024-08-13 DIAGNOSIS — I10 Essential (primary) hypertension: Secondary | ICD-10-CM | POA: Diagnosis not present

## 2024-08-13 DIAGNOSIS — Z6830 Body mass index (BMI) 30.0-30.9, adult: Secondary | ICD-10-CM | POA: Diagnosis not present

## 2024-08-13 DIAGNOSIS — Z0001 Encounter for general adult medical examination with abnormal findings: Secondary | ICD-10-CM | POA: Diagnosis not present

## 2024-08-13 DIAGNOSIS — M79645 Pain in left finger(s): Secondary | ICD-10-CM | POA: Diagnosis not present

## 2024-08-13 DIAGNOSIS — F17210 Nicotine dependence, cigarettes, uncomplicated: Secondary | ICD-10-CM | POA: Diagnosis not present

## 2024-08-13 DIAGNOSIS — Z23 Encounter for immunization: Secondary | ICD-10-CM | POA: Diagnosis not present

## 2024-08-13 DIAGNOSIS — M109 Gout, unspecified: Secondary | ICD-10-CM | POA: Diagnosis not present

## 2024-09-04 ENCOUNTER — Encounter: Payer: Self-pay | Admitting: Internal Medicine

## 2024-09-10 DIAGNOSIS — C61 Malignant neoplasm of prostate: Secondary | ICD-10-CM | POA: Diagnosis not present

## 2024-09-10 DIAGNOSIS — N200 Calculus of kidney: Secondary | ICD-10-CM | POA: Diagnosis not present
